# Patient Record
Sex: Male | Born: 1975
Health system: Southern US, Community
[De-identification: ages and names within clinical notes are randomized; demographics above are authoritative.]

## PROBLEM LIST (undated history)

## (undated) DIAGNOSIS — R945 Abnormal results of liver function studies: Secondary | ICD-10-CM

## (undated) DIAGNOSIS — E785 Hyperlipidemia, unspecified: Secondary | ICD-10-CM

## (undated) DIAGNOSIS — I10 Essential (primary) hypertension: Secondary | ICD-10-CM

## (undated) DIAGNOSIS — E119 Type 2 diabetes mellitus without complications: Secondary | ICD-10-CM

## (undated) DIAGNOSIS — E669 Obesity, unspecified: Secondary | ICD-10-CM

## (undated) DIAGNOSIS — B353 Tinea pedis: Secondary | ICD-10-CM

## (undated) HISTORY — DX: Hyperlipidemia, unspecified: E78.5

## (undated) HISTORY — DX: Tinea pedis: B35.3

## (undated) HISTORY — DX: Essential (primary) hypertension: I10

## (undated) HISTORY — DX: Type 2 diabetes mellitus without complications: E11.9

## (undated) HISTORY — DX: Abnormal results of liver function studies: R94.5

## (undated) HISTORY — PX: NO PAST SURGERIES: SHX2092

---

## 1999-10-09 ENCOUNTER — Emergency Department (HOSPITAL_COMMUNITY): Admission: EM | Admit: 1999-10-09 | Discharge: 1999-10-09 | Payer: Self-pay

## 2004-05-23 ENCOUNTER — Emergency Department (HOSPITAL_COMMUNITY): Admission: EM | Admit: 2004-05-23 | Discharge: 2004-05-23 | Payer: Self-pay | Admitting: Emergency Medicine

## 2011-07-09 ENCOUNTER — Emergency Department (INDEPENDENT_AMBULATORY_CARE_PROVIDER_SITE_OTHER)
Admission: EM | Admit: 2011-07-09 | Discharge: 2011-07-09 | Disposition: A | Discharge status: Home / Self Care | Payer: Self-pay | Source: Home / Self Care

## 2011-07-09 DIAGNOSIS — L03119 Cellulitis of unspecified part of limb: Secondary | ICD-10-CM

## 2011-07-09 DIAGNOSIS — L02415 Cutaneous abscess of right lower limb: Secondary | ICD-10-CM

## 2011-07-09 MED ORDER — DOXYCYCLINE HYCLATE 100 MG PO CAPS: 100.0000 mg | ORAL_CAPSULE | Freq: Two times a day (BID) | ORAL | Status: AC

## 2011-07-09 NOTE — ED Provider Notes (Signed)
Medical screening examination/treatment/procedure(s) were performed by non-physician practitioner and as supervising physician I was immediately available for consultation/collaboration.   Guy Toney DOUGLAS MD.    Camden Mazzaferro Douglas Robbin Escher, MD 07/09/11 2045 

## 2011-07-09 NOTE — ED Notes (Signed)
Patient has abscess to lateral calf just distal to the knee;  Patient states it started 4-5 days ago.  Patient states he tried to drain abscess on his own without success.  Patient hit his leg at work today and abscess started to drain.

## 2011-07-09 NOTE — ED Provider Notes (Signed)
History     CSN: [id number] Arrival date & time: 07/09/2011  5:26 PM   None     Chief Complaint  Patient presents with  . Abscess    (Consider location/radiation/quality/duration/timing/severity/associated sxs/prior treatment) Patient is a 35 y.o. male presenting with abscess. The history is provided by the patient.  Abscess  This is a new problem. Episode onset: 4-5 days ago. The onset was sudden. The problem occurs continuously. The problem has been gradually worsening. The abscess is present on the right lower leg. The problem is moderate. The abscess is characterized by painfulness, draining and swelling. Pertinent negatives include no fever. His past medical history does not include skin abscesses in family. Past medical history comments: No personal hx of abscesses. . There were no sick contacts. He has received no recent medical care.  Pt states he began recently using Hydrogen Peroxide on area of infection and today noticed increased swelling and drainage.   History reviewed. No pertinent past medical history.  History reviewed. No pertinent past surgical history.  History reviewed. No pertinent family history.  History  Substance Use Topics  . Smoking status: Never Smoker   . Smokeless tobacco: Not on file  . Alcohol Use: Yes     socially      Review of Systems  Constitutional: Negative for fever and chills.  Respiratory: Negative for shortness of breath.   Cardiovascular: Negative for chest pain.  Musculoskeletal: Negative for myalgias and joint swelling.    Allergies  Review of patient's allergies indicates no known allergies.  Home Medications   Current Outpatient Rx  Name Route Sig Dispense Refill  . DOXYCYCLINE HYCLATE 100 MG PO CAPS Oral Take 1 capsule (100 mg total) by mouth 2 (two) times daily. 20 capsule 0    BP 169/88  Pulse 100  Temp(Src) 99.8 F (37.7 C) (Oral)  Resp 20  SpO2 100%  Physical Exam  Nursing note and vitals  reviewed. Constitutional: He appears well-developed and well-nourished. No distress.  Musculoskeletal: Normal range of motion.       Right lower leg: He exhibits tenderness and swelling. He exhibits no bony tenderness, no edema and no laceration.       Legs: Skin: Skin is warm and dry. Lesion (see musculoskeletal exam) noted. No rash noted.  Psychiatric: He has a normal mood and affect.    ED Course  Procedures (including critical care time)   Labs Reviewed  CULTURE, ROUTINE-ABSCESS   No results found.   1. Abscess of right lower leg       MDM  Abscess RLL is draining. Small amount of pus expressed then only blood expressed. I&D not perfomed.         Melody Comas, [place] 07/09/11 1820

## 2011-07-12 LAB — CULTURE, ROUTINE-ABSCESS

## 2011-07-13 NOTE — ED Notes (Signed)
Abscess culture showed abundant staph aureus.  Pt adequately treated with Doxycycline.

## 2012-12-12 ENCOUNTER — Encounter: Payer: Self-pay | Admitting: Internal Medicine

## 2012-12-12 ENCOUNTER — Ambulatory Visit (INDEPENDENT_AMBULATORY_CARE_PROVIDER_SITE_OTHER): Payer: BC Managed Care – PPO | Admitting: Internal Medicine

## 2012-12-12 VITALS — BP 142/88 | HR 82 | Temp 97.9°F | Ht 71.0 in | Wt 342.0 lb

## 2012-12-12 DIAGNOSIS — Z0000 Encounter for general adult medical examination without abnormal findings: Secondary | ICD-10-CM

## 2012-12-12 DIAGNOSIS — Z23 Encounter for immunization: Secondary | ICD-10-CM

## 2012-12-12 DIAGNOSIS — I10 Essential (primary) hypertension: Secondary | ICD-10-CM | POA: Insufficient documentation

## 2012-12-12 DIAGNOSIS — R0683 Snoring: Secondary | ICD-10-CM

## 2012-12-12 DIAGNOSIS — G4733 Obstructive sleep apnea (adult) (pediatric): Secondary | ICD-10-CM | POA: Insufficient documentation

## 2012-12-12 HISTORY — DX: Essential (primary) hypertension: I10

## 2012-12-12 NOTE — Patient Instructions (Addendum)
Please come back fasting: FLP, CMP, CBC, TSH --- dx annual exam ---- Start a gradual exercise program, goal is to exercise at least 3 hours a week. We talked  about diet today, if you need further information or a nutritionist referral let me know. --- Check the  blood pressure 2 or 3 times a week, be sure it is between 110/60 and 140/85. If it is consistently higher or lower, let me know --- Please come back in 6 months, I am interested in checking your blood pressure and  Weight.      Sodium-Controlled Diet Sodium is a mineral. It is found in many foods. Sodium may be found naturally or added during the making of a food. The most common form of sodium is salt, which is made up of sodium and chloride. Reducing your sodium intake involves changing your eating habits. The following guidelines will help you reduce the sodium in your diet:  Stop using the salt shaker.  Use salt sparingly in cooking and baking.  Substitute with sodium-free seasonings and spices.  Do not use a salt substitute (potassium chloride) without your caregiver's permission.  Include a variety of fresh, unprocessed foods in your diet.  Limit the use of processed and convenience foods that are high in sodium. USE THE FOLLOWING FOODS SPARINGLY: Breads/Starches  Commercial bread stuffing, commercial pancake or waffle mixes, coating mixes. Waffles. Croutons. Prepared (boxed or frozen) potato, rice, or noodle mixes that contain salt or sodium. Salted [place] fries or hash browns. Salted popcorn, breads, crackers, chips, or snack foods. Vegetables  Vegetables canned with salt or prepared in cream, butter, or cheese sauces. Sauerkraut. Tomato or vegetable juices canned with salt.  Fresh vegetables are allowed if rinsed thoroughly. Fruit  Fruit is okay to eat. Meat and Meat Substitutes  Salted or smoked meats, such as bacon or Canadian bacon, chipped or corned beef, hot dogs, salt pork, luncheon meats, pastrami,  ham, or sausage. Canned or smoked fish, poultry, or meat. Processed cheese or cheese spreads, blue or Roquefort cheese. Battered or frozen fish products. Prepared spaghetti sauce. Baked beans. Reuben sandwiches. Salted nuts. Caviar. Milk  Limit buttermilk to 1 cup per week. Soups and Combination Foods  Bouillon cubes, canned or dried soups, broth, consomm. Convenience (frozen or packaged) dinners with more than 600 mg sodium. Pot pies, pizza, Asian food, fast food cheeseburgers, and specialty sandwiches. Desserts and Sweets  Regular (salted) desserts, pie, commercial fruit snack pies, commercial snack cakes, canned puddings.  Eat desserts and sweets in moderation. Fats and Oils  Gravy mixes or canned gravy. No more than 1 to 2 tbs of salad dressing. Chip dips.  Eat fats and oils in moderation. Beverages  See those listed under the vegetables and milk groups. Condiments  Ketchup, mustard, meat sauces, salsa, regular (salted) and lite soy sauce or mustard. Dill pickles, olives, meat tenderizer. Prepared horseradish or pickle relish. Dutch-processed cocoa. Baking powder or baking soda used medicinally. Worcestershire sauce. "Light" salt. Salt substitute, unless approved by your caregiver. Document Released: 01/26/2002 Document Revised: 10/29/2011 Document Reviewed: 08/29/2009 [facility] Patient Information 2013 [place], [place].

## 2012-12-12 NOTE — Assessment & Plan Note (Signed)
Snores heavily, episodes of apnea, BMI quite elevated: Refer to pulmonary to rule out sleep apnea. Weight loss is paramount, patient aware.

## 2012-12-12 NOTE — Assessment & Plan Note (Signed)
BP today 142/88, BP In 2012 when he was at the urgent care was 169/88. I believe he has a mildly elevated BP, should get better with exercise, weight loss, low salt diet. Patient aware. See instructions, reassess in 6 months

## 2012-12-12 NOTE — Progress Notes (Signed)
  Subjective:    Patient ID: Michael Willis, male    DOB: [birth year], 37 y.o.   MRN: [id number]  HPI New patient, here with his girlfriend.Requests a CPX.  No past medical history on file.  Past Surgical History  Procedure Laterality Date  . No past surgeries     History   Social History  . Marital Status: Single    Spouse Name: N/A    Number of Children: 5  . 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 Smoking status: Never Smoker   . Smokeless tobacco: Never Used  . Alcohol Use: Yes     Comment: socially  . Drug Use: No  . Sexually Active: Not on file   Other Topics Concern  . Not on file   Social History Narrative   Has a GF, live together .   Exercise: none   Diet: regular   Family History  Problem Relation Age of Onset  . Diabetes Neg Hx   . CAD Mother 43    MI?  . Stroke Neg Hx   . Colon cancer Neg Hx   . Prostate cancer Neg Hx      Review of Systems  Respiratory: Negative for cough, shortness of breath and wheezing.   Cardiovascular: Negative for chest pain, palpitations and leg swelling.  Gastrointestinal: Negative for abdominal pain and blood in stool.  Genitourinary: Negative for dysuria and hematuria.  Psychiatric/Behavioral:       No depression or anxiety   The patient's girlfriend reports that he snores quite heavily and has  prolonged episodes of apnea at night. The patient notes  that has been the case for long time. Denies feeling un rested in the morning, not sleepy throughout the day. When asked admits that he is very thirsty but has been that way all his life.     Objective:   Physical Exam BP 142/88  Pulse 82  Temp(Src) 97.9 F (36.6 C) (Oral)  Ht 5\' 11"  (1.803 m)  Wt 342 lb (155.13 kg)  BMI 47.72 kg/m2  SpO2 91%  General -- alert, well-developed, Morbidly obese, no distress .   Neck --no thyromegaly  HEENT -- TMs normal, throat w/o redness,  face symmetric  Lungs -- normal respiratory effort, no intercostal retractions, no accessory muscle use, and normal breath sounds.   Heart-- normal rate, regular rhythm, no murmur, and no gallop.   Abdomen--soft, non-tender, no distention, no masses, no HSM, no guarding, and no rigidity.   Extremities-- trace pretibial edema bilaterally Neurologic-- alert & oriented X3 and strength normal in all extremities. Psych-- Cognition and judgment appear intact. Alert and cooperative with normal attention span and concentration.  not anxious appearing and not depressed appearing.       Assessment & Plan:

## 2012-12-12 NOTE — Assessment & Plan Note (Addendum)
New patient, here for a physical exam. Tdap today EKG for baseline --> nsr O2 sats initially 91, repeated was 98. Never had a colonoscopy His main problem is his weight, BMI is 47. He reports he has been overweight all his life, he weighed 225 pounds in high school. Consequences of obesity discussed including potential for diabetes, hypertension, early death, DJD, sleep apnea, et Karie Soda discussed  Recommend a gradual exercise program. Weight Watchers? [employer]? Nutritionist referral?. Recommend to reassess in 6 months. Labs

## 2012-12-19 ENCOUNTER — Other Ambulatory Visit (INDEPENDENT_AMBULATORY_CARE_PROVIDER_SITE_OTHER): Payer: BC Managed Care – PPO

## 2012-12-19 DIAGNOSIS — Z0000 Encounter for general adult medical examination without abnormal findings: Secondary | ICD-10-CM

## 2012-12-19 LAB — CBC WITH DIFFERENTIAL/PLATELET
Basophils Absolute: 0 10*3/uL (ref 0.0–0.1)
Basophils Relative: 0.4 % (ref 0.0–3.0)
Eosinophils Absolute: 0.2 10*3/uL (ref 0.0–0.7)
Lymphocytes Relative: 32.7 % (ref 12.0–46.0)
MCHC: 34.8 g/dL (ref 30.0–36.0)
MCV: 83.8 fl (ref 78.0–100.0)
Monocytes Absolute: 0.8 10*3/uL (ref 0.1–1.0)
Neutrophils Relative %: 55.7 % (ref 43.0–77.0)
Platelets: 191 10*3/uL (ref 150.0–400.0)
RBC: 5.01 Mil/uL (ref 4.22–5.81)
RDW: 13.1 % (ref 11.5–14.6)

## 2012-12-19 LAB — TSH: TSH: 1.34 u[IU]/mL (ref 0.35–5.50)

## 2012-12-21 ENCOUNTER — Emergency Department (HOSPITAL_COMMUNITY)
Admission: EM | Admit: 2012-12-21 | Discharge: 2012-12-21 | Disposition: A | Discharge status: Home / Self Care | Payer: BC Managed Care – PPO | Attending: Emergency Medicine | Admitting: Emergency Medicine

## 2012-12-21 ENCOUNTER — Encounter (HOSPITAL_COMMUNITY): Payer: Self-pay | Admitting: Emergency Medicine

## 2012-12-21 DIAGNOSIS — Y93H9 Activity, other involving exterior property and land maintenance, building and construction: Secondary | ICD-10-CM | POA: Insufficient documentation

## 2012-12-21 DIAGNOSIS — S91109A Unspecified open wound of unspecified toe(s) without damage to nail, initial encounter: Secondary | ICD-10-CM | POA: Insufficient documentation

## 2012-12-21 DIAGNOSIS — Y92009 Unspecified place in unspecified non-institutional (private) residence as the place of occurrence of the external cause: Secondary | ICD-10-CM | POA: Insufficient documentation

## 2012-12-21 DIAGNOSIS — E669 Obesity, unspecified: Secondary | ICD-10-CM | POA: Insufficient documentation

## 2012-12-21 DIAGNOSIS — S91115A Laceration without foreign body of left lesser toe(s) without damage to nail, initial encounter: Secondary | ICD-10-CM

## 2012-12-21 DIAGNOSIS — W269XXA Contact with unspecified sharp object(s), initial encounter: Secondary | ICD-10-CM | POA: Insufficient documentation

## 2012-12-21 HISTORY — DX: Obesity, unspecified: E66.9

## 2012-12-21 MED ORDER — HYDROCODONE-ACETAMINOPHEN 5-325 MG PO TABS: 2.0000 | ORAL_TABLET | ORAL | Status: DC | PRN

## 2012-12-21 NOTE — ED Notes (Signed)
Pt states he noticed laceration around 3pm this afternoon.

## 2012-12-21 NOTE — ED Provider Notes (Signed)
Medical screening examination/treatment/procedure(s) were performed by non-physician practitioner and as supervising physician I was immediately available for consultation/collaboration.   Bernyce Brimley, MD 12/21/12 2312 

## 2012-12-21 NOTE — ED Notes (Signed)
PT. TRIPPED AND FELL AT HOME  TODAY PRESENTS  WITH RIGHT PROXIMAL 5TH TOE LACERATION WITH SLIGHT BLEEDING .

## 2012-12-21 NOTE — ED Provider Notes (Signed)
History     CSN: [id number]  Arrival date & time 12/21/12  2002   None     Chief Complaint  Patient presents with  . Toe Injury    (Consider location/radiation/quality/duration/timing/severity/associated sxs/prior treatment) HPI History provided by pt.   Pt sustained a laceration to plantar surface of right pinky toe while working outside today.  He is unsure of cause of injury. Pain is minimal and bleeding controlled.  No associated paresthesias. Tetanus is up to date.   Past Medical History  Diagnosis Date  . Obesity     Past Surgical History  Procedure Laterality Date  . No past surgeries      Family History  Problem Relation Age of Onset  . Diabetes Neg Hx   . CAD Mother 78    MI?  . Stroke Neg Hx   . Colon cancer Neg Hx   . Prostate cancer Neg Hx     History  Substance Use Topics  . Smoking status: Never Smoker   . Smokeless tobacco: Never Used  . Alcohol Use: Yes     Comment: socially      Review of Systems  All other systems reviewed and are negative.    Allergies  Review of patient's allergies indicates no known allergies.  Home Medications   Current Outpatient Rx  Name  Route  Sig  Dispense  Refill  . ibuprofen (ADVIL,MOTRIN) 200 MG tablet   Oral   Take 200 mg by mouth daily as needed for pain. For pain         . HYDROcodone-acetaminophen (NORCO/VICODIN) 5-325 MG per tablet   Oral   Take 2 tablets by mouth every 4 (four) hours as needed for pain.   10 tablet   0     BP 154/96  Pulse 88  Temp(Src) 98.3 F (36.8 C) (Oral)  Resp 16  SpO2 98%  Physical Exam  Nursing note and vitals reviewed. Constitutional: He is oriented to person, place, and time. He appears well-developed and well-nourished. No distress.  HENT:  Head: Normocephalic and atraumatic.  Eyes:  Normal appearance  Neck: Normal range of motion.  Pulmonary/Chest: Effort normal.  Musculoskeletal: Normal range of motion.  Varus deformity of right fifth toe; it is  naturally tucked below the fourth toe.  There is a 1.5cm horizontal laceration that runs along plantar surface of metatarsal-phalangeal joint.  Hemostatic and clean.  Distal sensation intact.   Neurological: He is alert and oriented to person, place, and time.  Psychiatric: He has a normal mood and affect. His behavior is normal.    ED Course  Procedures (including critical care time)  LACERATION REPAIR Performed by: Otilio Miu Authorized by: Ruby Cola E Consent: Verbal consent obtained. Risks and benefits: risks, benefits and alternatives were discussed Consent given by: patient Patient identity confirmed: provided demographic data Prepped and Draped in normal sterile fashion Wound explored  Laceration Location: plantar surface of right fifth toe Laceration Length: 1.5cm  No Foreign Bodies seen or palpated  Anesthesia: local infiltration  Local anesthetic: lidocaine 2% w/out epinephrine  Anesthetic total: 3 ml  Irrigation method: syringe Amount of cleaning: standard  Skin closure: prolene 3.0  Number of sutures: 4  Technique: simple interrupted  Patient tolerance: Patient tolerated the procedure well with no immediate complications.  Labs Reviewed - No data to display No results found.   1. Laceration of fifth toe, left, initial encounter       MDM  37yo healthy M presents  w/ laceration to plantar surface of right fifth metatarsal-phalangeal joint.  Toe w/ significant varus deformity and I suspect that the skin tore when he stepped on something, causing the toe to extend at this joint.  Wound cleaned by nursing staff and then sutured.  Tetanus is up to date.  Prescribed 10 vicodin for pain and nursing staff wrapped to immobilize and provided w/ post-op shoe.  Return precautions discussed.          Otilio Miu, PA-C 12/21/12 [id number]

## 2012-12-21 NOTE — ED Notes (Signed)
Suture cart at bedside 

## 2012-12-22 LAB — COMPREHENSIVE METABOLIC PANEL
AST: 44 U/L — ABNORMAL HIGH (ref 0–37)
Albumin: 4.3 g/dL (ref 3.5–5.2)
BUN: 13 mg/dL (ref 6–23)
Calcium: 9.2 mg/dL (ref 8.4–10.5)
Chloride: 100 mEq/L (ref 96–112)
Glucose, Bld: 193 mg/dL — ABNORMAL HIGH (ref 70–99)
Potassium: 3.7 mEq/L (ref 3.5–5.1)
Sodium: 135 mEq/L (ref 135–145)
Total Protein: 7.6 g/dL (ref 6.0–8.3)

## 2012-12-22 LAB — LIPID PANEL: VLDL: 100.6 mg/dL — ABNORMAL HIGH (ref 0.0–40.0)

## 2012-12-22 LAB — LDL CHOLESTEROL, DIRECT: Direct LDL: 143.1 mg/dL

## 2012-12-23 ENCOUNTER — Telehealth: Payer: Self-pay | Admitting: Internal Medicine

## 2012-12-23 NOTE — Telephone Encounter (Signed)
Patient Information:  Caller Name: Markeem  Phone: [phone]  Patient: Michael Willis, Michael Willis  Gender: Male  DOB: Jul 28, 1976  Age: 37 Years  PCP: Willow Ora  Office Follow Up:  Does the office need to follow up with this patient?: Yes  Instructions For The Office: see RN notes  RN Note:  Patient is not having any pain other than normal soreness at the location of the injury. He has not used any of the pain medication that was prescribed. He wants to know if the stitches can come out this early when he was told they needed to stay in for 10 days. Patient able to walk normally.  Symptoms  Reason For Call & Symptoms: Patient fell at home on Sunday and went to the ED where he got 4 stitches in his foot. Two of the stitches are now loose. Patient was told he needed to keep the stitches in for 10 days. No drainage or bleeding from the injury. No drainage or redness around the stitches.  Reviewed Health History In EMR: Yes  Reviewed Medications In EMR: Yes  Reviewed Allergies In EMR: Yes  Reviewed Surgeries / Procedures: Yes  Date of Onset of Symptoms: 12/23/2012  Guideline(s) Used:  No Protocol Available - Information Only  Disposition Per Guideline:   Callback by PCP Today  Reason For Disposition Reached:   Nursing judgment  Advice Given:  N/A  Patient Will Follow Care Advice:  YES

## 2012-12-25 NOTE — Telephone Encounter (Signed)
Yes f/u in 10 days, sooner if redness, d/c or increase pain

## 2012-12-25 NOTE — Telephone Encounter (Signed)
Does pt just need to just schedule appt next week to remove stitches? Please advise.

## 2012-12-25 NOTE — Telephone Encounter (Signed)
Caller: Reynaldo/Patient; Phone: [phone]; Reason for Call: Patient called on 12/23/12.  Message was sent regarding stitches in his foot coming out before ten days.  Patient has not received a call back with any instructions.  Please contact patient.

## 2012-12-25 NOTE — Telephone Encounter (Signed)
Spoke to pt, pt states his stitches came out its on & would like to follow-up with Dr. Drue Novel to make sure everything is ok. Scheduled pt for 5.9.14 @245p .

## 2012-12-26 ENCOUNTER — Ambulatory Visit (INDEPENDENT_AMBULATORY_CARE_PROVIDER_SITE_OTHER): Payer: BC Managed Care – PPO | Admitting: Internal Medicine

## 2012-12-26 VITALS — BP 140/90 | HR 104 | Temp 98.0°F | Wt 338.0 lb

## 2012-12-26 DIAGNOSIS — R739 Hyperglycemia, unspecified: Secondary | ICD-10-CM

## 2012-12-26 DIAGNOSIS — IMO0002 Reserved for concepts with insufficient information to code with codable children: Secondary | ICD-10-CM

## 2012-12-26 DIAGNOSIS — S91119S Laceration without foreign body of unspecified toe without damage to nail, sequela: Secondary | ICD-10-CM

## 2012-12-26 DIAGNOSIS — R7309 Other abnormal glucose: Secondary | ICD-10-CM

## 2012-12-26 MED ORDER — DOXYCYCLINE HYCLATE 100 MG PO TABS: 100.0000 mg | ORAL_TABLET | Freq: Two times a day (BID) | ORAL | Status: DC

## 2012-12-26 MED ORDER — KETOCONAZOLE 2 % EX CREA: TOPICAL_CREAM | Freq: Every day | CUTANEOUS | Status: DC

## 2012-12-26 NOTE — Patient Instructions (Addendum)
Wound care: Keep the area clean and dry, take your showers as usual Call anytime if you see redness, swelling or discharge. Take the  antibiotics as prescribed (Doxycycline) --- Michael Willis checking a test for diabetes, will call you with a result in few days. --- You do have a skin infection in the feet, put ketoconazole every night on all the feet and between the toes except for the area of the wound. --- Next visit in 2 months

## 2012-12-26 NOTE — Progress Notes (Signed)
  Subjective:    Patient ID: Michael Willis, male    DOB: August 18, 1976, 37 y.o.   MRN: [id number]  HPI ER f/u, went to the ER 12-21-12, had a laceration, unclear how it happened. Here b/c stitches "came out".  Past Medical History  Diagnosis Date  . Obesity    Past Surgical History  Procedure Laterality Date  . No past surgeries      Review of Systems Denies pain, fever , d/c    Objective:   Physical Exam General -- alert, well-developed, NAD.    Extremities-- no pretibial edema bilaterally base of the 5th R toe has a deep laceration, stitches are not holding anything. No d/c or cellulitis. Otherwise, the foot has changes c/w tinea pedis (mocasine distribution) and onychomycosis  . Psych-- Cognition and judgment appear intact. Alert and cooperative with normal attention span and concentration.  not anxious appearing and not depressed appearing.        Assessment & Plan:   Laceration Stitches are not holding anything, we'll remove them, see instructions about wound care  Hyperglycemia, check a hemoglobin A1c, I think he likely has diabetes, I told the patient will communicate with him next week about results. Addendum, pt was somehow surprised about possible "elevated sugar", explained in simple terms what that means, the need for diet, exercise, loose wt and probably meds..  Tinea pedis and onychomycosis Rx topical ketoconazole but If not improving will need oral medications (Notice his LFTs are elevated).   increased LFTs, elevated cholesterol noted   Today , I spent more than 25 min with the patient, >50% of the time counseling, and   reviewing the chart

## 2012-12-27 LAB — HEMOGLOBIN A1C: Hgb A1c MFr Bld: 8.4 % — ABNORMAL HIGH (ref <5.7)

## 2012-12-28 ENCOUNTER — Encounter: Payer: Self-pay | Admitting: Internal Medicine

## 2013-01-05 ENCOUNTER — Ambulatory Visit (INDEPENDENT_AMBULATORY_CARE_PROVIDER_SITE_OTHER): Payer: BC Managed Care – PPO | Admitting: Internal Medicine

## 2013-01-05 ENCOUNTER — Encounter: Payer: Self-pay | Admitting: Internal Medicine

## 2013-01-05 VITALS — BP 136/76 | HR 94 | Temp 98.4°F | Wt 330.0 lb

## 2013-01-05 DIAGNOSIS — E119 Type 2 diabetes mellitus without complications: Secondary | ICD-10-CM

## 2013-01-05 DIAGNOSIS — B353 Tinea pedis: Secondary | ICD-10-CM

## 2013-01-05 DIAGNOSIS — R7989 Other specified abnormal findings of blood chemistry: Secondary | ICD-10-CM

## 2013-01-05 HISTORY — DX: Other specified abnormal findings of blood chemistry: R79.89

## 2013-01-05 HISTORY — DX: Tinea pedis: B35.3

## 2013-01-05 HISTORY — DX: Type 2 diabetes mellitus without complications: E11.9

## 2013-01-05 NOTE — Assessment & Plan Note (Signed)
Tinea pedis improving, I think at some point he will need oral therapy but for now we'll focus on diabetes care, LFTs are slightly elevated.

## 2013-01-05 NOTE — Assessment & Plan Note (Addendum)
Recently diagnosed with diabetes with A1c of 8.4. Patient  Educated about: What is diabetes, risks and consequences, also diet and exercise. Recommend to read the book Diabetes for Dummies, he already did some self learning Start janumet, 4 weeks of samples provided.LFTs in 3 weeks, prescription will be  Issued if LFTs okay.

## 2013-01-05 NOTE — Progress Notes (Signed)
  Subjective:    Patient ID: Michael Willis, male    DOB: 04/05/1976, 37 y.o.   MRN: [id number]  HPI Followup from previous visit. Status post antibiotics, foot wound looks better to patient. As far as diabetes, he has done some reading, has already started to change his diet. Increased LFTs, he drinks alcohol very rarely, does not take Tylenol  Past Medical History  Diagnosis Date  . Obesity   . Diabetes 01/05/2013  . Tinea pedis, onychomycosis 01/05/2013  . Elevated LFTs 01/05/2013   Past Surgical History  Procedure Laterality Date  . No past surgeries      Review of Systems     Objective:   Physical Exam Alert, oriented x3, no apparent distress. Vital signs stable. Laceration by the fifth right toe seems better, not redness, discharge. Tinea pedis decreased-better  to inspection as well.       Assessment & Plan:  Laceration, Seems to be improving, patient is going to the beach this weekend, encouraged to keep a close eye on the area, if redness or swelling needs to see a local doctor.

## 2013-01-05 NOTE — Patient Instructions (Addendum)
janumet 50-500, 1 tablet a day for 3 days, then one tablet twice a day. Check your blood sugar once a day Come back to this office in  3 weeks to recheck your blood only (LFTs dx DM), make an appointment Next visit to see me 10 week Watch the wound in the foot, you need to be seen if redness , swelling or discharge

## 2013-01-05 NOTE — Assessment & Plan Note (Signed)
Mildly elevated today, we'll recheck in 3 weeks. Starting Janumet today

## 2013-01-16 ENCOUNTER — Ambulatory Visit (INDEPENDENT_AMBULATORY_CARE_PROVIDER_SITE_OTHER): Payer: BC Managed Care – PPO | Admitting: Pulmonary Disease

## 2013-01-16 ENCOUNTER — Encounter: Payer: Self-pay | Admitting: Pulmonary Disease

## 2013-01-16 VITALS — BP 160/100 | HR 86 | Temp 98.1°F | Ht 72.0 in | Wt 327.8 lb

## 2013-01-16 DIAGNOSIS — G4733 Obstructive sleep apnea (adult) (pediatric): Secondary | ICD-10-CM

## 2013-01-16 NOTE — Assessment & Plan Note (Signed)
The patient's history is most consistent with significant sleep disordered breathing, and he also has underlying diabetes and elevated blood pressure today as well.  I had a long discussion with him about sleep apnea, including its impact to his quality of life and cardiovascular health.  I think he needs to have a sleep study for diagnosis, and he is an excellent candidate for home sleep testing.

## 2013-01-16 NOTE — Patient Instructions (Addendum)
Will set up for home sleep testing.  Once results are available, will arrange followup to discuss. Work on weight loss

## 2013-01-16 NOTE — Progress Notes (Signed)
Subjective:    Patient ID: Michael Willis, male    DOB: [birth year], 37 y.o.   MRN: [id number]  HPI The patient is a 37 year old male who been asked to see for possible obstructive sleep apnea.  He has been noted by his bed partner to have very loud snoring, as well as an abnormal breathing pattern during sleep.  He has frequently awakenings at night, and is only rested about half the mornings when he arises.  He denies any significant sleepiness during the day, but will fall asleep in the evenings watching television.  He also has some sleep pressure driving longer distances.  The patient states that his weight is up about 10-20 pounds over the last 2 years, and his Epworth score today is 4   Sleep Questionnaire What time do you typically go to bed?( Between what hours) 11-11:30Pm 11-11:30Pm at 1004 on 01/16/13 by Alecia Lemming, CMA How long does it take you to fall asleep?  at 1004 on 01/16/13 by Alecia Lemming, CMA How many times during the night do you wake up? No Value 2-3  at 1004 on 01/16/13 by Alecia Lemming, CMA What time do you get out of bed to start your day? 0700 0700 at 1004 on 01/16/13 by Alecia Lemming, CMA Do you drive or operate heavy machinery in your occupation? No No at 1004 on 01/16/13 by Alecia Lemming, CMA How much has your weight changed (up or down) over the past two years? (In pounds) No Value 10-20 at 1004 on 01/16/13 by Alecia Lemming, CMA Have you ever had a sleep study before? No No at 1004 on 01/16/13 by Alecia Lemming, CMA Do you currently use CPAP? No No at 1004 on 01/16/13 by Alecia Lemming, CMA Do you wear oxygen at any time? No    Review of Systems  Constitutional: Negative for fever and unexpected weight change.  HENT: Positive for congestion and rhinorrhea. Negative for ear pain, nosebleeds, sore throat, sneezing, trouble swallowing, dental problem, postnasal drip and sinus pressure.        Only at  night   Eyes: Negative for redness and itching.  Respiratory: Negative for cough, chest tightness, shortness of breath and wheezing.   Cardiovascular: Negative for palpitations and leg swelling.  Gastrointestinal: Negative for nausea and vomiting.  Genitourinary: Negative for dysuria.  Musculoskeletal: Negative for joint swelling.  Skin: Negative for rash.  Neurological: Negative for headaches.  Hematological: Does not bruise/bleed easily.  Psychiatric/Behavioral: Negative for dysphoric mood. The patient is not nervous/anxious.        Objective:   Physical Exam Constitutional:  Morbidly obese male, no acute distress  HENT:  Nares patent without discharge, large turbinates, deviated septum to left with narrowing.   Oropharynx without exudate, palate and uvula are elongated, large tongue, mod increased tonsils.   Eyes:  Perrla, eomi, no scleral icterus  Neck:  No JVD, no TMG  Cardiovascular:  Normal rate, regular rhythm, no rubs or gallops.  No murmurs        Intact distal pulses  Pulmonary :  Normal breath sounds, no stridor or respiratory distress   No rales, rhonchi, or wheezing  Abdominal:  Soft, nondistended, bowel sounds present.  No tenderness noted.   Musculoskeletal:  No lower extremity edema noted.  Lymph Nodes:  No cervical lymphadenopathy noted  Skin:  No cyanosis noted  Neurologic:  Appears mildly sleepy, appropriate, moves all 4 extremities without obvious deficit.  Assessment & Plan:

## 2013-01-26 ENCOUNTER — Other Ambulatory Visit: Payer: BC Managed Care – PPO

## 2013-01-27 ENCOUNTER — Telehealth: Payer: Self-pay | Admitting: Internal Medicine

## 2013-01-27 ENCOUNTER — Other Ambulatory Visit (INDEPENDENT_AMBULATORY_CARE_PROVIDER_SITE_OTHER): Payer: BC Managed Care – PPO

## 2013-01-27 DIAGNOSIS — R7309 Other abnormal glucose: Secondary | ICD-10-CM

## 2013-01-27 DIAGNOSIS — R739 Hyperglycemia, unspecified: Secondary | ICD-10-CM

## 2013-01-27 LAB — HEPATIC FUNCTION PANEL
ALT: 72 U/L — ABNORMAL HIGH (ref 0–53)
Albumin: 3.9 g/dL (ref 3.5–5.2)
Total Protein: 7.5 g/dL (ref 6.0–8.3)

## 2013-01-27 LAB — HEMOGLOBIN A1C: Hgb A1c MFr Bld: 9.5 % — ABNORMAL HIGH (ref 4.6–6.5)

## 2013-01-27 MED ORDER — KETOCONAZOLE 2 % EX CREA: TOPICAL_CREAM | Freq: Every day | CUTANEOUS | Status: DC

## 2013-01-27 NOTE — Addendum Note (Signed)
Addended by: Silvio Pate D on: 01/27/2013 08:39 AM   Modules accepted: Orders

## 2013-01-27 NOTE — Telephone Encounter (Signed)
Refill done.  

## 2013-01-27 NOTE — Telephone Encounter (Signed)
Pt wanted to see if he could get a refill for ketoconazole (NIZORAL) 2 % cream and for it to called into [id number] - [place] ([place]), [place] - [address] thanks

## 2013-02-09 ENCOUNTER — Ambulatory Visit: Payer: BC Managed Care – PPO | Admitting: Internal Medicine

## 2013-02-17 ENCOUNTER — Telehealth: Payer: Self-pay | Admitting: Internal Medicine

## 2013-02-17 MED ORDER — SITAGLIPTIN PHOS-METFORMIN HCL 50-500 MG PO TABS: 1.0000 | ORAL_TABLET | Freq: Two times a day (BID) | ORAL | Status: DC

## 2013-02-17 NOTE — Telephone Encounter (Signed)
Patient is asking for his prescription sitaGLIPtan-metformin (JANUMET) 50-500 MG per tablet be sent to [facility] on [address].

## 2013-02-17 NOTE — Telephone Encounter (Signed)
Rx sent 

## 2013-03-16 ENCOUNTER — Ambulatory Visit: Payer: BC Managed Care – PPO | Admitting: Internal Medicine

## 2013-03-20 ENCOUNTER — Telehealth: Payer: Self-pay | Admitting: Internal Medicine

## 2013-03-20 MED ORDER — SAXAGLIPTIN-METFORMIN ER 5-1000 MG PO TB24: 1.0000 | ORAL_TABLET | Freq: Every day | ORAL | Status: DC

## 2013-03-20 NOTE — Telephone Encounter (Signed)
Patient called stating Janumet is too expensive and he would like something else. Pt uses [facility] on [address].

## 2013-03-20 NOTE — Telephone Encounter (Signed)
Discussed with patient verbalized understanding. rx sent to pharmacy of choice.

## 2013-03-20 NOTE — Telephone Encounter (Signed)
Please advise 

## 2013-03-20 NOTE — Telephone Encounter (Signed)
We can try kombiglize, may be better covered by his insurance Rx is ready to be send, tell pt is a ONCE a day pill F/u as planned

## 2013-03-31 ENCOUNTER — Ambulatory Visit (INDEPENDENT_AMBULATORY_CARE_PROVIDER_SITE_OTHER): Payer: BC Managed Care – PPO | Admitting: Internal Medicine

## 2013-03-31 ENCOUNTER — Encounter: Payer: Self-pay | Admitting: Internal Medicine

## 2013-03-31 VITALS — BP 140/100 | HR 105 | Temp 98.0°F | Wt 291.4 lb

## 2013-03-31 DIAGNOSIS — B353 Tinea pedis: Secondary | ICD-10-CM

## 2013-03-31 DIAGNOSIS — R030 Elevated blood-pressure reading, without diagnosis of hypertension: Secondary | ICD-10-CM

## 2013-03-31 DIAGNOSIS — E119 Type 2 diabetes mellitus without complications: Secondary | ICD-10-CM

## 2013-03-31 MED ORDER — SITAGLIPTIN PHOS-METFORMIN HCL 50-500 MG PO TABS: 1.0000 | ORAL_TABLET | Freq: Two times a day (BID) | ORAL | Status: DC

## 2013-03-31 MED ORDER — LOSARTAN POTASSIUM 50 MG PO TABS: 50.0000 mg | ORAL_TABLET | Freq: Every day | ORAL | Status: DC

## 2013-03-31 NOTE — Patient Instructions (Addendum)
You're doing great Go back to janumet twice a day Start losartan 50 mg one every morning, this will help your blood pressure. Schedule labs to be done in  2 weeks from today:  BMP-- dx HTN A1C AST, ALT-- dx DM Next visit with me in 3 months

## 2013-03-31 NOTE — Progress Notes (Signed)
  Subjective:    Patient ID: Michael Willis, male    DOB: 08-Sep-1975, 37 y.o.   MRN: [id number]  HPI Three-month followup Since the last time he was here, he had to switch from janumet to [place] due to insurance issues. He doesn't like komobyglize Because immediately after he takes it he starts to urinate a lot. Likes to go back on janumet  Past Medical History  Diagnosis Date  . Obesity   . Diabetes 01/05/2013  . Tinea pedis, onychomycosis 01/05/2013  . Elevated LFTs 01/05/2013   Past Surgical History  Procedure Laterality Date  . No past surgeries       Review of Systems Diet--significantly improved, has lost weight Exercise--he remains active, also swimming. No ambulatory CBGs Denies dysuria or difficulty urinating Skin infection @ feet look better. Snoring less lately    Objective:   Physical Exam BP 140/100  Pulse 105  Temp(Src) 98 F (36.7 C) (Oral)  Wt 291 lb 6.4 oz (132.178 kg)  BMI 39.51 kg/m2  SpO2 94%  General -- alert, well-developed, NAD .   Lungs -- normal respiratory effort, no intercostal retractions, no accessory muscle use, and normal breath sounds.   Heart-- normal rate, regular rhythm, no murmur, and no gallop.   Extremities--  Skin between the toes normal, + onychomycosis  Psych-- Cognition and judgment appear intact. Alert and cooperative with normal attention span and concentration.  not anxious appearing and not depressed appearing.       Assessment & Plan:

## 2013-03-31 NOTE — Assessment & Plan Note (Addendum)
Janumet was very expensive, was forced to take kombyglize one at night with largest meal, c/o urinary frequency and likes to go back tojanumet. No amb CBGs Lifestyle has significantly improved. Plan: a1c, LFTs, back on janumet

## 2013-03-31 NOTE — Assessment & Plan Note (Signed)
Tinea pedis resilved, consider Rx onychomycosis later

## 2013-03-31 NOTE — Assessment & Plan Note (Signed)
BP still elevated despite loosing wt, start losartan

## 2013-04-14 ENCOUNTER — Other Ambulatory Visit: Payer: BC Managed Care – PPO

## 2013-04-17 ENCOUNTER — Other Ambulatory Visit: Payer: Self-pay | Admitting: *Deleted

## 2013-04-17 ENCOUNTER — Other Ambulatory Visit (INDEPENDENT_AMBULATORY_CARE_PROVIDER_SITE_OTHER): Payer: BC Managed Care – PPO

## 2013-04-17 DIAGNOSIS — I10 Essential (primary) hypertension: Secondary | ICD-10-CM

## 2013-04-17 DIAGNOSIS — E1110 Type 2 diabetes mellitus with ketoacidosis without coma: Secondary | ICD-10-CM

## 2013-04-17 DIAGNOSIS — E119 Type 2 diabetes mellitus without complications: Secondary | ICD-10-CM

## 2013-04-17 DIAGNOSIS — E1310 Other specified diabetes mellitus with ketoacidosis without coma: Secondary | ICD-10-CM

## 2013-04-17 LAB — HEMOGLOBIN A1C: Hgb A1c MFr Bld: 12.6 % — ABNORMAL HIGH (ref 4.6–6.5)

## 2013-04-17 LAB — BASIC METABOLIC PANEL
Calcium: 9.6 mg/dL (ref 8.4–10.5)
GFR: 112.35 mL/min (ref >60.00)
Potassium: 3.6 mEq/L (ref 3.5–5.1)
Sodium: 134 mEq/L — ABNORMAL LOW (ref 135–145)

## 2013-04-17 LAB — ALT: ALT: 42 U/L (ref 0–53)

## 2013-04-17 MED ORDER — GLIMEPIRIDE 2 MG PO TABS: 2.0000 mg | ORAL_TABLET | Freq: Every day | ORAL | Status: DC

## 2013-04-17 NOTE — Telephone Encounter (Signed)
Rx for amaryl sent to phramacy, pt made aware and appt made for next week with provider.

## 2013-04-24 ENCOUNTER — Ambulatory Visit: Payer: BC Managed Care – PPO | Admitting: Internal Medicine

## 2013-04-25 ENCOUNTER — Telehealth: Payer: Self-pay | Admitting: Internal Medicine

## 2013-04-25 NOTE — Telephone Encounter (Signed)
Diabetes poorly controlled, pt had an appointment to discuss results 04-24-13 but changed to 05-08-13

## 2013-04-28 ENCOUNTER — Telehealth: Payer: Self-pay | Admitting: *Deleted

## 2013-04-28 ENCOUNTER — Other Ambulatory Visit: Payer: Self-pay | Admitting: *Deleted

## 2013-04-28 NOTE — Telephone Encounter (Signed)
Message copied by Eustace Quail on Tue Apr 28, 2013  4:17 PM ------      Message from: Wanda Plump      Created: Sat Apr 25, 2013 10:03 PM      Regarding: did we ever called the pt?       If we did please document... ------

## 2013-04-28 NOTE — Telephone Encounter (Signed)
Pt notified via telephone. DJR  

## 2013-05-08 ENCOUNTER — Ambulatory Visit: Payer: BC Managed Care – PPO | Admitting: Internal Medicine

## 2013-05-21 ENCOUNTER — Ambulatory Visit: Payer: BC Managed Care – PPO | Admitting: Internal Medicine

## 2013-05-21 DIAGNOSIS — Z0289 Encounter for other administrative examinations: Secondary | ICD-10-CM

## 2013-05-30 ENCOUNTER — Other Ambulatory Visit: Payer: Self-pay | Admitting: Internal Medicine

## 2013-06-01 ENCOUNTER — Other Ambulatory Visit: Payer: Self-pay | Admitting: *Deleted

## 2013-06-01 MED ORDER — LOSARTAN POTASSIUM 50 MG PO TABS: 50.0000 mg | ORAL_TABLET | Freq: Every day | ORAL | Status: DC

## 2013-06-01 NOTE — Telephone Encounter (Signed)
Losartan refill sent to pharmacy 

## 2013-06-12 ENCOUNTER — Ambulatory Visit: Payer: BC Managed Care – PPO | Admitting: Internal Medicine

## 2013-06-30 ENCOUNTER — Encounter: Payer: Self-pay | Admitting: Internal Medicine

## 2013-06-30 ENCOUNTER — Ambulatory Visit (INDEPENDENT_AMBULATORY_CARE_PROVIDER_SITE_OTHER): Payer: BC Managed Care – PPO | Admitting: Internal Medicine

## 2013-06-30 VITALS — BP 154/100 | HR 78 | Temp 98.0°F | Wt 307.0 lb

## 2013-06-30 DIAGNOSIS — E119 Type 2 diabetes mellitus without complications: Secondary | ICD-10-CM

## 2013-06-30 DIAGNOSIS — R030 Elevated blood-pressure reading, without diagnosis of hypertension: Secondary | ICD-10-CM

## 2013-06-30 LAB — BASIC METABOLIC PANEL
BUN: 16 mg/dL (ref 6–23)
Calcium: 9.5 mg/dL (ref 8.4–10.5)
Creatinine, Ser: 1 mg/dL (ref 0.4–1.5)
GFR: 109.61 mL/min (ref >60.00)

## 2013-06-30 MED ORDER — SITAGLIPTIN PHOS-METFORMIN HCL 50-500 MG PO TABS: 1.0000 | ORAL_TABLET | Freq: Two times a day (BID) | ORAL | Status: DC

## 2013-06-30 MED ORDER — LOSARTAN POTASSIUM 100 MG PO TABS: 100.0000 mg | ORAL_TABLET | Freq: Every day | ORAL | Status: DC

## 2013-06-30 MED ORDER — GLIMEPIRIDE 4 MG PO TABS: 4.0000 mg | ORAL_TABLET | Freq: Every day | ORAL | Status: DC

## 2013-06-30 NOTE — Assessment & Plan Note (Signed)
On losartan 50 mg daily, BP elevated today, no ambulatory BPs. Plan: BMP, increase losartan to 100 mg, amb BPs

## 2013-06-30 NOTE — Progress Notes (Signed)
  Subjective:    Patient ID: Michael Willis, male    DOB: 05-Apr-1976, 37 y.o.   MRN: [id number]  HPI Diabetes followup Last A1c was very high, my idea was to add Amaryl to his regimen however he discontinue Janumet, apparently a  misunderstanding. No ambulatory BPs or CBGs.  Past Medical History  Diagnosis Date  . Obesity   . Diabetes 01/05/2013  . Tinea pedis, onychomycosis 01/05/2013  . Elevated LFTs 01/05/2013   Past Surgical History  Procedure Laterality Date  . No past surgeries       Review of Systems Denies any blurred vision or increase in thirst;  urinary frequency is at baseline.     Objective:   Physical Exam BP 154/100  Pulse 78  Temp(Src) 98 F (36.7 C)  Wt 307 lb (139.254 kg)  SpO2 95% General -- alert, well-developed, NAD.   Lungs -- normal respiratory effort, no intercostal retractions, no accessory muscle use, and normal breath sounds.  Heart-- normal rate, regular rhythm, no murmur.   Extremities-- no pretibial edema bilaterally  Neurologic--  alert & oriented X3.   Psych-- Cognition and judgment appear intact. Cooperative with normal attention span and concentration. No anxious appearing , no depressed appearing.      Assessment & Plan:  Today , I spent more than 20  min with the patient, >50% of the time counseling

## 2013-06-30 NOTE — Assessment & Plan Note (Addendum)
Last A1c was extremely high, my intention was to add Amaryl to  janumet but he is currently taking only amaryl, no ambulatory CBGs. Plan:  Restart janumet I suspect he will need more than 2 mg of amaryl, increased to 4 mg Ambulatory CBGs, call w/ reading in 2 weeks  follow up in 2 months Low sugar sx provided

## 2013-06-30 NOTE — Progress Notes (Signed)
Pre visit review using our clinic review tool, if applicable. No additional management support is needed unless otherwise documented below in the visit note. 

## 2013-06-30 NOTE — Patient Instructions (Signed)
Get your blood work before you leave  Next visit in 2 months for for a diabetes hypertension   follow up (30 minutes) . No Fasting Please make an appointment    Check the  blood pressure 2 or 3 times a Week be sure it is between 110/60 and 140/85. Ideal blood pressure is 120/80. If it is consistently higher or lower, let me know  Check your blood sugars twice a day: always in the morning before breakfast, then  after a meal. Call w/ blood sugar readings in 2 weeks   [facility] web site for Diabetes StagedNews.no  "[facility] Diabetes diet" book

## 2013-07-03 ENCOUNTER — Encounter: Payer: Self-pay | Admitting: *Deleted

## 2013-07-15 ENCOUNTER — Telehealth: Payer: Self-pay | Admitting: *Deleted

## 2013-07-15 ENCOUNTER — Other Ambulatory Visit: Payer: Self-pay | Admitting: *Deleted

## 2013-07-15 MED ORDER — SITAGLIPTIN PHOS-METFORMIN HCL 50-500 MG PO TABS: 1.0000 | ORAL_TABLET | Freq: Two times a day (BID) | ORAL | Status: DC

## 2013-07-15 NOTE — Telephone Encounter (Signed)
Janumet prescription sent to [facility]. Attempted to contact patient, but his voicemail has not been set up.

## 2013-07-15 NOTE — Telephone Encounter (Signed)
Pt called and stated that his Janumet was not sent to pharmacy. Epic shows that the prescription was printed. Pt states he does not have it. Is it ok if we send the prescription electronically to [facility] on [address]?

## 2013-07-15 NOTE — Telephone Encounter (Signed)
Janumet sent to patients pharmacy

## 2013-07-15 NOTE — Telephone Encounter (Signed)
Please do

## 2013-08-14 ENCOUNTER — Telehealth: Payer: Self-pay

## 2013-08-14 NOTE — Telephone Encounter (Signed)
Patient presents to the office for samples of Janumet 50-500mg  until his next appt in January. Samples given. Logged in book.

## 2013-09-04 ENCOUNTER — Ambulatory Visit (INDEPENDENT_AMBULATORY_CARE_PROVIDER_SITE_OTHER): Payer: BC Managed Care – PPO | Admitting: Internal Medicine

## 2013-09-04 ENCOUNTER — Encounter: Payer: Self-pay | Admitting: Internal Medicine

## 2013-09-04 VITALS — BP 146/85 | HR 87 | Temp 97.9°F | Wt 326.0 lb

## 2013-09-04 DIAGNOSIS — E119 Type 2 diabetes mellitus without complications: Secondary | ICD-10-CM

## 2013-09-04 DIAGNOSIS — IMO0001 Reserved for inherently not codable concepts without codable children: Secondary | ICD-10-CM

## 2013-09-04 DIAGNOSIS — R030 Elevated blood-pressure reading, without diagnosis of hypertension: Secondary | ICD-10-CM

## 2013-09-04 LAB — BASIC METABOLIC PANEL
BUN: 14 mg/dL (ref 6–23)
CO2: 28 meq/L (ref 19–32)
Calcium: 9.1 mg/dL (ref 8.4–10.5)
Chloride: 104 mEq/L (ref 96–112)
Creatinine, Ser: 1 mg/dL (ref 0.4–1.5)
GFR: 113.46 mL/min (ref >60.00)
GLUCOSE: 138 mg/dL — AB (ref 70–99)
POTASSIUM: 3.9 meq/L (ref 3.5–5.1)
SODIUM: 139 meq/L (ref 135–145)

## 2013-09-04 LAB — AST: AST: 27 U/L (ref 0–37)

## 2013-09-04 LAB — HEMOGLOBIN A1C: Hgb A1c MFr Bld: 6.9 % — ABNORMAL HIGH (ref 4.6–6.5)

## 2013-09-04 LAB — ALT: ALT: 52 U/L (ref 0–53)

## 2013-09-04 NOTE — Patient Instructions (Addendum)
Get your blood work before you leave   Next visit is for routine check up regards diabetes, hypertension   in 3 months, fasting Please make an appointment     Two great books to learn about diabetes Diabetes fro Dummies "[facility] Diabetes diet" book

## 2013-09-04 NOTE — Progress Notes (Signed)
Pre visit review using our clinic review tool, if applicable. No additional management support is needed unless otherwise documented below in the visit note. 

## 2013-09-04 NOTE — Assessment & Plan Note (Signed)
Good compliance with medication, encourage diet , exercise,  self learning. Recommend to check CBGs once daily Recommend not to run out of medication Labs Followup in 3 months

## 2013-09-04 NOTE — Progress Notes (Signed)
   Subjective:    Patient ID: Michael Willis, male    DOB: [birth year], 38 y.o.   MRN: [id number]  HPI F/u from previous visit Good compliance with medication. No ambulatory BPs or CBGs . Diet has not been the best, has not improved. He is still active at work, no routine exercise  Past Medical History  Diagnosis Date  . Obesity   . Diabetes 01/05/2013  . Tinea pedis, onychomycosis 01/05/2013  . Elevated LFTs 01/05/2013  . HTN (hypertension) 12/12/2012   Past Surgical History  Procedure Laterality Date  . No past surgeries      Review of Systems No symptoms consistent with low blood sugars Vision normal No chest pain or shortness of breath    Objective:   Physical Exam BP 146/85  Pulse 87  Temp(Src) 97.9 F (36.6 C)  Wt 326 lb (147.873 kg)  SpO2 97% General -- alert, well-developed, NAD. Lungs -- normal respiratory effort, no intercostal retractions, no accessory muscle use, and normal breath sounds.  Heart-- normal rate, regular rhythm, no murmur.   Extremities-- no pretibial edema bilaterally  Psych-- Cognition and judgment appear intact. Cooperative with normal attention span and concentration. No anxious or depressed appearing.     Assessment & Plan:

## 2013-09-04 NOTE — Assessment & Plan Note (Signed)
Good compliance with medications, not ambulatory BPs, BP today is satisfactory, no change, labs

## 2013-09-07 ENCOUNTER — Encounter: Payer: Self-pay | Admitting: *Deleted

## 2013-09-07 ENCOUNTER — Telehealth: Payer: Self-pay

## 2013-09-07 NOTE — Telephone Encounter (Signed)
Relevant patient education assigned to patient using Emmi. ° °

## 2013-11-01 ENCOUNTER — Other Ambulatory Visit: Payer: Self-pay | Admitting: Internal Medicine

## 2013-11-17 ENCOUNTER — Other Ambulatory Visit: Payer: Self-pay | Admitting: Internal Medicine

## 2013-12-04 ENCOUNTER — Ambulatory Visit (INDEPENDENT_AMBULATORY_CARE_PROVIDER_SITE_OTHER): Payer: BC Managed Care – PPO | Admitting: Internal Medicine

## 2013-12-04 ENCOUNTER — Encounter: Payer: Self-pay | Admitting: Internal Medicine

## 2013-12-04 VITALS — BP 136/90 | HR 88 | Temp 97.9°F | Wt 326.0 lb

## 2013-12-04 DIAGNOSIS — E785 Hyperlipidemia, unspecified: Secondary | ICD-10-CM

## 2013-12-04 DIAGNOSIS — I10 Essential (primary) hypertension: Secondary | ICD-10-CM

## 2013-12-04 DIAGNOSIS — E119 Type 2 diabetes mellitus without complications: Secondary | ICD-10-CM

## 2013-12-04 HISTORY — DX: Hyperlipidemia, unspecified: E78.5

## 2013-12-04 LAB — LIPID PANEL
Cholesterol: 302 mg/dL — ABNORMAL HIGH (ref 0–200)
HDL: 42.9 mg/dL (ref >39.00)
LDL CALC: 68 mg/dL (ref 0–99)
TRIGLYCERIDES: 956 mg/dL — AB (ref 0.0–149.0)
Total CHOL/HDL Ratio: 7
VLDL: 191.2 mg/dL — ABNORMAL HIGH (ref 0.0–40.0)

## 2013-12-04 LAB — HEMOGLOBIN A1C: HEMOGLOBIN A1C: 7.9 % — AB (ref 4.6–6.5)

## 2013-12-04 LAB — AST: AST: 38 U/L — ABNORMAL HIGH (ref 0–37)

## 2013-12-04 LAB — ALT: ALT: 88 U/L — ABNORMAL HIGH (ref 0–53)

## 2013-12-04 MED ORDER — LOSARTAN POTASSIUM 100 MG PO TABS: ORAL_TABLET | ORAL | Status: DC

## 2013-12-04 MED ORDER — GLIMEPIRIDE 4 MG PO TABS: ORAL_TABLET | ORAL | Status: DC

## 2013-12-04 MED ORDER — SITAGLIPTIN PHOS-METFORMIN HCL 50-500 MG PO TABS: 1.0000 | ORAL_TABLET | Freq: Two times a day (BID) | ORAL | Status: DC

## 2013-12-04 NOTE — Progress Notes (Signed)
   Subjective:    Patient ID: Liston Albaeven A Ermis, male    DOB: 08-Aug-1976, 38 y.o.   MRN: [id number]  DOS:  12/04/2013 Type of  visit: Routine office visit Diabetes--good compliance with medications, janumet is expensive. No recent ambulatory CBGs. Hypertension, BP today 156/90, no recent ambulatory BPs. Hyperlipidemia, due for labs   ROS Has not been exercising lately, has gained weight. Diet remains satisfactory Denies chest pain, difficulty breathing no LE edema Denies nausea, vomiting, diarrhea. No cough or sputum production. No paresthesias in the lower extremities  Past Medical History  Diagnosis Date  . Obesity   . Diabetes 01/05/2013  . Tinea pedis, onychomycosis 01/05/2013  . Elevated LFTs 01/05/2013  . HTN (hypertension) 12/12/2012  . Hyperlipidemia 12/04/2013    Past Surgical History  Procedure Laterality Date  . No past surgeries      History   Social History  . Marital Status: Single    Spouse Name: N/A    Number of Children: 5  . 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 Smoking status: Former Smoker -- 6 years    Types: Cigars    Quit date: 09/18/2000  . Smokeless tobacco: Never Used     Comment: smoke a few cigars off an on .   Marland Kitchen. Alcohol Use: Yes     Comment: socially  . Drug Use: No  . Sexual Activity: Not on file   Other Topics Concern  . Not on file   Social History Narrative   Has a GF, live together .   Exercise: none   Diet: regular        Medication List       This list is accurate as of: 12/04/13 11:59 PM.  Always use your most recent med list.               glimepiride 4 MG tablet  Commonly known as:  AMARYL  Take one tablet daily before breakfast. DUE for appointment [phone].please schedule.     losartan 100 MG tablet  Commonly known as:  COZAAR  TAKE ONE TABLET BY MOUTH ONCE DAILY     sitaGLIPtin-metformin 50-500 MG per tablet    Commonly known as:  JANUMET  Take 1 tablet by mouth 2 (two) times daily with a meal.           Objective:   Physical Exam BP 136/90  Pulse 88  Temp(Src) 97.9 F (36.6 C)  Wt 326 lb (147.873 kg)  SpO2 97% General -- alert, well-developed, NAD.  Lungs -- normal respiratory effort, no intercostal retractions, no accessory muscle use, and normal breath sounds.  Heart-- normal rate, regular rhythm, no murmur.  DIABETIC FEET EXAM: No lower extremity edema Normal pedal pulses bilaterally Skin dry w/o lesions, nails slt thick, no calluses Pinprick examination of the feet normal. Psych-- Cognition and judgment appear intact. Cooperative with normal attention span and concentration. No anxious or depressed appearing.     Assessment & Plan:

## 2013-12-04 NOTE — Assessment & Plan Note (Signed)
Last A1c was great, since then he is not exercising much and has gained weight. Plan: Refill medications, discount coupon provided for janumet Encourage to check CBGs Eye care discussed Feet exam today negative Encourage to go back to more exercise , weight loss

## 2013-12-04 NOTE — Assessment & Plan Note (Addendum)
Last cholesterol panel showed severe hyperlipidemia, will check a FLP today, anticipate it will better

## 2013-12-04 NOTE — Progress Notes (Signed)
Pre visit review using our clinic review tool, if applicable. No additional management support is needed unless otherwise documented below in the visit note. 

## 2013-12-04 NOTE — Patient Instructions (Signed)
Please come back next week fasting for blood work: FLP, AST, ALT---- hyperlipidemia A1c ---diabetes  You need to see the  eye doctor every year.  Check the  blood pressure   Weekly  be sure it is between 110/60 and 140/85. Ideal blood pressure is 120/80. If it is consistently higher or lower, let me know  Checked her blood sugars daily if possible: sometimes fasting, sometimes 2 hours after meal. If the readings are more than 160, let us know.   Next visit is for a physical exam in 3 months   fasting Please make an appointment

## 2013-12-04 NOTE — Assessment & Plan Note (Signed)
BP slightly elevated today, no ambulatory BPs. Plan: Continue present care, check ambulatory BPs.

## 2013-12-07 ENCOUNTER — Telehealth: Payer: Self-pay | Admitting: Internal Medicine

## 2013-12-07 NOTE — Telephone Encounter (Signed)
Relevant patient education assigned to patient using Emmi. ° °

## 2013-12-09 MED ORDER — ATORVASTATIN CALCIUM 40 MG PO TABS: 40.0000 mg | ORAL_TABLET | Freq: Every day | ORAL | Status: DC

## 2013-12-09 NOTE — Addendum Note (Signed)
Addended by: Verdie ShireBAYNES, Roczen Waymire M on: 12/09/2013 01:23 PM   Modules accepted: Orders

## 2014-01-20 ENCOUNTER — Other Ambulatory Visit: Payer: BC Managed Care – PPO

## 2014-02-07 ENCOUNTER — Telehealth: Payer: Self-pay | Admitting: Internal Medicine

## 2014-02-07 DIAGNOSIS — E785 Hyperlipidemia, unspecified: Secondary | ICD-10-CM

## 2014-02-07 NOTE — Telephone Encounter (Signed)
Was prescribed Lipitor 2 months ago, due for labs. Please arrange FLP, AST, ALT -------- dx  hyperlipidemia

## 2014-02-08 NOTE — Telephone Encounter (Signed)
Unable to lmovm. Will try again.

## 2014-02-09 MED ORDER — ATORVASTATIN CALCIUM 40 MG PO TABS: 40.0000 mg | ORAL_TABLET | Freq: Every day | ORAL | Status: DC

## 2014-02-09 NOTE — Telephone Encounter (Signed)
Spoke with patient who stated that he did not know he needed to take Lipitor, I advised that due to his elevated chol he was supposed to have started this in April. He stated he will start it tomorrow after he returns to town. . Lab appt made for 04/06/14 at 8am. Future orders placed.

## 2014-04-06 ENCOUNTER — Other Ambulatory Visit (INDEPENDENT_AMBULATORY_CARE_PROVIDER_SITE_OTHER): Payer: BC Managed Care – PPO

## 2014-04-06 ENCOUNTER — Encounter: Payer: Self-pay | Admitting: *Deleted

## 2014-04-06 DIAGNOSIS — E785 Hyperlipidemia, unspecified: Secondary | ICD-10-CM

## 2014-04-06 LAB — LIPID PANEL
Cholesterol: 162 mg/dL (ref 0–200)
HDL: 34.8 mg/dL — ABNORMAL LOW (ref >39.00)
NonHDL: 127.2
Total CHOL/HDL Ratio: 5
Triglycerides: 323 mg/dL — ABNORMAL HIGH (ref 0.0–149.0)
VLDL: 64.6 mg/dL — ABNORMAL HIGH (ref 0.0–40.0)

## 2014-04-06 LAB — AST: AST: 29 U/L (ref 0–37)

## 2014-04-06 LAB — ALT: ALT: 61 U/L — ABNORMAL HIGH (ref 0–53)

## 2014-04-06 LAB — LDL CHOLESTEROL, DIRECT: LDL DIRECT: 85 mg/dL

## 2014-04-28 ENCOUNTER — Other Ambulatory Visit: Payer: Self-pay

## 2014-04-28 ENCOUNTER — Telehealth: Payer: Self-pay | Admitting: Internal Medicine

## 2014-04-28 MED ORDER — LOSARTAN POTASSIUM 100 MG PO TABS: ORAL_TABLET | ORAL | Status: DC

## 2014-04-28 NOTE — Telephone Encounter (Signed)
Caller name: Angelgabriel Relation to pt: self  Call back number: [phone] Pharmacy: [phone] [facility] [address] [place], [place], [place] [address] *please send rx to this pharmacy"   Reason for call:   Pt lost blood pressure medication requesting another script please send to [facility] [phone]   (pt has no blood pressure medication)

## 2014-04-28 NOTE — Telephone Encounter (Signed)
Pt informed that medication has been sent to Pharmacy.

## 2014-05-03 ENCOUNTER — Telehealth: Payer: Self-pay

## 2014-05-06 NOTE — Telephone Encounter (Signed)
No encounter

## 2014-05-26 ENCOUNTER — Telehealth: Payer: Self-pay | Admitting: Internal Medicine

## 2014-05-26 ENCOUNTER — Other Ambulatory Visit: Payer: Self-pay

## 2014-05-26 MED ORDER — SITAGLIPTIN PHOS-METFORMIN HCL 50-500 MG PO TABS: 1.0000 | ORAL_TABLET | Freq: Two times a day (BID) | ORAL | Status: DC

## 2014-05-26 NOTE — Telephone Encounter (Signed)
Caller name: Braxxton Relation to pt: self  Call back number: [phone] [facility] [address]  Reason for call:   Patient is requesting a refill of janumet

## 2014-05-26 NOTE — Telephone Encounter (Signed)
Medication sent to Pharmacy, Pt needs appt for any further refills.

## 2014-06-23 ENCOUNTER — Other Ambulatory Visit: Payer: Self-pay

## 2014-06-23 DIAGNOSIS — E785 Hyperlipidemia, unspecified: Secondary | ICD-10-CM

## 2014-06-23 MED ORDER — ATORVASTATIN CALCIUM 40 MG PO TABS: 40.0000 mg | ORAL_TABLET | Freq: Every day | ORAL | Status: DC

## 2014-06-29 ENCOUNTER — Other Ambulatory Visit: Payer: Self-pay

## 2014-06-29 DIAGNOSIS — E785 Hyperlipidemia, unspecified: Secondary | ICD-10-CM

## 2014-06-29 MED ORDER — ATORVASTATIN CALCIUM 40 MG PO TABS: 40.0000 mg | ORAL_TABLET | Freq: Every day | ORAL | Status: DC

## 2014-06-29 MED ORDER — SITAGLIPTIN PHOS-METFORMIN HCL 50-500 MG PO TABS: 1.0000 | ORAL_TABLET | Freq: Two times a day (BID) | ORAL | Status: DC

## 2014-07-07 ENCOUNTER — Ambulatory Visit (INDEPENDENT_AMBULATORY_CARE_PROVIDER_SITE_OTHER): Payer: BC Managed Care – PPO | Admitting: Internal Medicine

## 2014-07-07 ENCOUNTER — Encounter: Payer: Self-pay | Admitting: Internal Medicine

## 2014-07-07 VITALS — BP 128/84 | HR 88 | Temp 98.2°F | Wt 302.4 lb

## 2014-07-07 DIAGNOSIS — E119 Type 2 diabetes mellitus without complications: Secondary | ICD-10-CM

## 2014-07-07 DIAGNOSIS — R945 Abnormal results of liver function studies: Secondary | ICD-10-CM

## 2014-07-07 DIAGNOSIS — Z0000 Encounter for general adult medical examination without abnormal findings: Secondary | ICD-10-CM

## 2014-07-07 DIAGNOSIS — E785 Hyperlipidemia, unspecified: Secondary | ICD-10-CM

## 2014-07-07 DIAGNOSIS — R7989 Other specified abnormal findings of blood chemistry: Secondary | ICD-10-CM

## 2014-07-07 DIAGNOSIS — I10 Essential (primary) hypertension: Secondary | ICD-10-CM

## 2014-07-07 MED ORDER — ATORVASTATIN CALCIUM 40 MG PO TABS: 40.0000 mg | ORAL_TABLET | Freq: Every day | ORAL | Status: DC

## 2014-07-07 MED ORDER — SITAGLIPTIN PHOS-METFORMIN HCL 50-500 MG PO TABS: 1.0000 | ORAL_TABLET | Freq: Two times a day (BID) | ORAL | Status: DC

## 2014-07-07 MED ORDER — LOSARTAN POTASSIUM 100 MG PO TABS: ORAL_TABLET | ORAL | Status: DC

## 2014-07-07 MED ORDER — GLIMEPIRIDE 4 MG PO TABS: ORAL_TABLET | ORAL | Status: DC

## 2014-07-07 NOTE — Assessment & Plan Note (Signed)
Good compliance with medication except for the last few days because he ran out. Prescription sent. No ambulatory CBGs, he does have a glucometer, encouraged him to start checking. Has polyuria and polydipsia, has lost weight, diet and exercise is slightly improved. I suspect his blood sugar may not be well-controlled, we are checking an A1c in few days, further advice would results.

## 2014-07-07 NOTE — Progress Notes (Signed)
Pre visit review using our clinic review tool, if applicable. No additional management support is needed unless otherwise documented below in the visit note. 

## 2014-07-07 NOTE — Assessment & Plan Note (Addendum)
Out of  Losartan x few days,  restart meds and  check a BMP, see instructions

## 2014-07-07 NOTE — Patient Instructions (Addendum)
Restart her medications today  Diabetes: Check your blood sugar 3-4  times a week at different times   GOALS: Fasting before a meal 70- 130 2 hours after a meal less than 180 At bedtime 90-150 Call if consistently not at goal     Stop by the front desk and schedule labs to be done wby 07-19-2014  (fasting)

## 2014-07-07 NOTE — Progress Notes (Signed)
   Subjective:    Patient ID: Michael Willis, male    DOB: 12-Sep-1975, 38 y.o.   MRN: [id number]  DOS:  07/07/2014 Type of visit - description :  Routine visit, here with his wife Interval history: Good compliance with medications except for the last week where he ran out of Janumet and his cholesterol medication. No ambulatory BPs, no ambulatory CBGs Reports that he urinates a lot and is very thirsty. Has lost weight.  ROS Denies chest pain or difficulty breathing No nausea, vomiting, diarrhea  Past Medical History  Diagnosis Date  . Obesity   . Diabetes 01/05/2013  . Tinea pedis, onychomycosis 01/05/2013  . Elevated LFTs 01/05/2013  . HTN (hypertension) 12/12/2012  . Hyperlipidemia 12/04/2013    Past Surgical History  Procedure Laterality Date  . No past surgeries      History   Social History  . Marital Status: Single    Spouse Name: N/A    Number of Children: 5  . 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 Smoking status: Former Smoker -- 6 years    Types: Cigars    Quit date: 09/18/2000  . Smokeless tobacco: Never Used     Comment: smoke a few cigars off an on .   Marland Kitchen. Alcohol Use: Yes     Comment: socially  . Drug Use: No  . Sexual Activity: Not on file   Other Topics Concern  . Not on file   Social History Narrative   Has a GF, live together .   Exercise: none   Diet: regular        Medication List       This list is accurate as of: 07/07/14 10:33 AM.  Always use your most recent med list.               atorvastatin 40 MG tablet  Commonly known as:  LIPITOR  Take 1 tablet (40 mg total) by mouth daily. DUE FOR APPT WITH DR Daiveon Markman FOR ANY FURTHER REFILLS. [phone].     glimepiride 4 MG tablet  Commonly known as:  AMARYL  Take one tablet daily before breakfast. DUE for appointment [phone].please schedule.     losartan 100 MG tablet  Commonly known as:  COZAAR    TAKE ONE TABLET BY MOUTH ONCE DAILY     sitaGLIPtin-metformin 50-500 MG per tablet  Commonly known as:  JANUMET  Take 1 tablet by mouth 2 (two) times daily with a meal. Take 1 tablet by mouth 2 times daily. OVERDUE FOR APPT WITH DR Wilbur Labuda [phone].           Objective:   Physical Exam BP 128/84 mmHg  Pulse 88  Temp(Src) 98.2 F (36.8 C) (Oral)  Wt 302 lb 6 oz (137.156 kg)  SpO2 97%  General -- alert, well-developed, NAD.   Lungs -- normal respiratory effort, no intercostal retractions, no accessory muscle use, and normal breath sounds.  Heart-- normal rate, regular rhythm, no murmur.   Extremities-- no pretibial edema bilaterally  Neurologic--  alert & oriented X3. Speech normal, gait appropriate for age, strength symmetric and appropriate for age.  Psych-- Cognition and judgment appear intact. Cooperative with normal attention span and concentration. No anxious or depressed appearing.       Assessment & Plan:   I described the benefits of a flu shot in detail, declined

## 2014-07-07 NOTE — Assessment & Plan Note (Signed)
Good compliance with cholesterol medication except in the last few days, check FLP in few days

## 2014-07-19 ENCOUNTER — Other Ambulatory Visit (INDEPENDENT_AMBULATORY_CARE_PROVIDER_SITE_OTHER): Payer: BC Managed Care – PPO

## 2014-07-19 DIAGNOSIS — I10 Essential (primary) hypertension: Secondary | ICD-10-CM

## 2014-07-19 DIAGNOSIS — E785 Hyperlipidemia, unspecified: Secondary | ICD-10-CM

## 2014-07-19 DIAGNOSIS — E119 Type 2 diabetes mellitus without complications: Secondary | ICD-10-CM

## 2014-07-19 LAB — BASIC METABOLIC PANEL
BUN: 12 mg/dL (ref 6–23)
CO2: 25 mEq/L (ref 19–32)
Calcium: 9 mg/dL (ref 8.4–10.5)
Chloride: 99 mEq/L (ref 96–112)
Creatinine, Ser: 1 mg/dL (ref 0.4–1.5)
GFR: 102.96 mL/min (ref >60.00)
GLUCOSE: 269 mg/dL — AB (ref 70–99)
POTASSIUM: 4 meq/L (ref 3.5–5.1)
Sodium: 133 mEq/L — ABNORMAL LOW (ref 135–145)

## 2014-07-19 LAB — LIPID PANEL
CHOLESTEROL: 190 mg/dL (ref 0–200)
HDL: 27.9 mg/dL — ABNORMAL LOW (ref >39.00)
NONHDL: 162.1
TRIGLYCERIDES: 621 mg/dL — AB (ref 0.0–149.0)
Total CHOL/HDL Ratio: 7
VLDL: 124.2 mg/dL — ABNORMAL HIGH (ref 0.0–40.0)

## 2014-07-19 LAB — HEMOGLOBIN A1C: Hgb A1c MFr Bld: 11 % — ABNORMAL HIGH (ref 4.6–6.5)

## 2014-07-20 LAB — LDL CHOLESTEROL, DIRECT: Direct LDL: 61.5 mg/dL

## 2014-07-21 MED ORDER — SITAGLIPTIN PHOS-METFORMIN HCL 50-1000 MG PO TABS: 1.0000 | ORAL_TABLET | Freq: Two times a day (BID) | ORAL | Status: DC

## 2014-07-21 NOTE — Addendum Note (Signed)
Addended by: Dorette GrateFAULKNER, Reece Mcbroom C on: 07/21/2014 10:16 AM   Modules accepted: Orders, Medications

## 2014-09-05 ENCOUNTER — Telehealth: Payer: Self-pay | Admitting: Internal Medicine

## 2014-09-05 NOTE — Telephone Encounter (Signed)
Advise pt, needs ROV ref DM HTN, please arrange

## 2014-09-06 NOTE — Telephone Encounter (Signed)
Appointment reminder letter printed and mailed to Pt.

## 2014-10-09 ENCOUNTER — Other Ambulatory Visit: Payer: Self-pay | Admitting: Internal Medicine

## 2014-10-12 ENCOUNTER — Ambulatory Visit (INDEPENDENT_AMBULATORY_CARE_PROVIDER_SITE_OTHER): Payer: BLUE CROSS/BLUE SHIELD | Admitting: Internal Medicine

## 2014-10-12 ENCOUNTER — Encounter: Payer: Self-pay | Admitting: Internal Medicine

## 2014-10-12 VITALS — BP 123/82 | HR 105 | Temp 98.3°F | Wt 311.0 lb

## 2014-10-12 DIAGNOSIS — E119 Type 2 diabetes mellitus without complications: Secondary | ICD-10-CM

## 2014-10-12 LAB — BASIC METABOLIC PANEL
BUN: 17 mg/dL (ref 6–23)
CHLORIDE: 97 meq/L (ref 96–112)
CO2: 30 meq/L (ref 19–32)
Calcium: 9.8 mg/dL (ref 8.4–10.5)
Creatinine, Ser: 1.18 mg/dL (ref 0.40–1.50)
GFR: 88.88 mL/min (ref >60.00)
Glucose, Bld: 300 mg/dL — ABNORMAL HIGH (ref 70–99)
Potassium: 4.1 mEq/L (ref 3.5–5.1)
Sodium: 135 mEq/L (ref 135–145)

## 2014-10-12 LAB — AST: AST: 29 U/L (ref 0–37)

## 2014-10-12 LAB — HEMOGLOBIN A1C: Hgb A1c MFr Bld: 9.2 % — ABNORMAL HIGH (ref 4.6–6.5)

## 2014-10-12 LAB — ALT: ALT: 63 U/L — AB (ref 0–53)

## 2014-10-12 NOTE — Progress Notes (Signed)
Pre visit review using our clinic review tool, if applicable. No additional management support is needed unless otherwise documented below in the visit note. 

## 2014-10-12 NOTE — Assessment & Plan Note (Signed)
Last A1c 11, Janumet dose increased. The patient is not checking ambulatory CBGs. We had a long conversation about the need to diet correctly and start checking his blood sugars. He is very active at work, exercise is not a problem. Long term complications of diabetes including eye disease, CAD, strokes discussed as well. Plan: Labs refer to a nutritionist start checking ambulatory blood sugars at least once a day follow-up 3 months. Adjust medication if needed based on A1c. Also, triglycerides were quite elevated, will recheck when he comes back to the office

## 2014-10-12 NOTE — Progress Notes (Signed)
   Subjective:    Patient ID: Michael Willis, male    DOB: 1976/07/07, 39 y.o.   MRN: [id number]  DOS:  10/12/2014 Type of visit - description : dm f/u Interval history: Last A1C was elevated, Janumet dose increase, good compliance w/ medication. Patient reports diet has improved, states he is eating more salads, fruits and less fried food. He remains quite active at work No ambulatory CBGs  Review of Systems Polyuria has decrease, no blurred vision. Denies nausea, vomiting, diarrhea. No lower extremity edema  Past Medical History  Diagnosis Date  . Obesity   . Diabetes 01/05/2013  . Tinea pedis, onychomycosis 01/05/2013  . Elevated LFTs 01/05/2013  . HTN (hypertension) 12/12/2012  . Hyperlipidemia 12/04/2013    Past Surgical History  Procedure Laterality Date  . No past surgeries      History   Social History  . Marital Status: Single    Spouse Name: N/A  . Number of Children: 5  . 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 Smoking status: Former Smoker -- 6 years    Types: Cigars    Quit date: 09/18/2000  . Smokeless tobacco: Never Used     Comment: smoke a few cigars off an on .   Marland Kitchen. Alcohol Use: Yes     Comment: socially  . Drug Use: No  . Sexual Activity: Not on file   Other Topics Concern  . Not on file   Social History Narrative   Has a GF, live together .   Exercise: none   Diet: regular        Medication List       This list is accurate as of: 10/12/14 11:59 PM.  Always use your most recent med list.               atorvastatin 40 MG tablet  Commonly known as:  LIPITOR  Take 1 tablet (40 mg total) by mouth daily.     canagliflozin 100 MG Tabs tablet  Commonly known as:  INVOKANA  Take 1 tablet (100 mg total) by mouth daily.     glimepiride 4 MG tablet  Commonly known as:  AMARYL  Take one tablet daily before breakfast.     JANUMET 50-1000 MG per tablet    Generic drug:  sitaGLIPtin-metformin  TAKE ONE TABLET BY MOUTH TWICE DAILY WITH MEALS     losartan 100 MG tablet  Commonly known as:  COZAAR  TAKE ONE TABLET BY MOUTH ONCE DAILY           Objective:   Physical Exam BP 123/82 mmHg  Pulse 105  Temp(Src) 98.3 F (36.8 C) (Oral)  Wt 311 lb (141.069 kg)  SpO2 94% General:   Well developed, well nourished . NAD.  HEENT:  Normocephalic . Face symmetric, atraumatic DIABETIC FEET EXAM: No lower extremity edema Normal pedal pulses bilaterally Skin normal, nails slightly dystrophic Pinprick examination of the feet normal. Neurologic:  alert & oriented X3.  Speech normal, gait appropriate for age and unassisted Psych--  Cognition and judgment appear intact.  Cooperative with normal attention span and concentration.  Behavior appropriate. No anxious or depressed appearing.      Assessment & Plan:

## 2014-10-12 NOTE — Patient Instructions (Signed)
Get your blood work before you leave   Diabetes: Check your blood sugar  once a day   at different times of the day  GOALS: Fasting before a meal 70- 130 2 hours after a meal less than 180 At bedtime 90-150 Call if consistently not at goal Call in 2 weeks and let us know how you are doing  Come back in 3 months  for a  office visit     Come back fasting

## 2014-10-14 MED ORDER — CANAGLIFLOZIN 100 MG PO TABS: 100.0000 mg | ORAL_TABLET | Freq: Every day | ORAL | Status: DC

## 2014-10-18 ENCOUNTER — Telehealth: Payer: Self-pay | Admitting: Internal Medicine

## 2014-10-18 NOTE — Telephone Encounter (Signed)
I placed a discount card in the mail for his Invokana on 10/14/2014, hopefully he will be receiving that in the mail soon.

## 2014-10-18 NOTE — Telephone Encounter (Signed)
The new medicine (he does not know the name) is too expensive.  It is $30.00.  Do you have any discount cards for this med

## 2015-01-13 ENCOUNTER — Other Ambulatory Visit: Payer: Self-pay | Admitting: Internal Medicine

## 2015-01-27 ENCOUNTER — Telehealth: Payer: Self-pay

## 2015-01-27 NOTE — Telephone Encounter (Signed)
Letter printed and mailed to Pt.  

## 2015-01-27 NOTE — Telephone Encounter (Signed)
-----   Message from Wanda Plump, MD sent at 01/27/2015  4:38 PM EDT ----- Regarding: Letter Please send him a letter, he is overdue for a routine checkup for diabetes

## 2015-02-14 ENCOUNTER — Other Ambulatory Visit: Payer: Self-pay | Admitting: Internal Medicine

## 2015-02-17 ENCOUNTER — Other Ambulatory Visit: Payer: Self-pay | Admitting: Internal Medicine

## 2015-03-01 ENCOUNTER — Other Ambulatory Visit: Payer: Self-pay | Admitting: Internal Medicine

## 2015-03-10 ENCOUNTER — Encounter: Payer: Self-pay | Admitting: Internal Medicine

## 2015-03-10 ENCOUNTER — Ambulatory Visit (INDEPENDENT_AMBULATORY_CARE_PROVIDER_SITE_OTHER): Payer: BLUE CROSS/BLUE SHIELD | Admitting: Internal Medicine

## 2015-03-10 VITALS — BP 134/84 | HR 85 | Temp 97.6°F | Ht 72.0 in | Wt 317.4 lb

## 2015-03-10 DIAGNOSIS — B353 Tinea pedis: Secondary | ICD-10-CM | POA: Diagnosis not present

## 2015-03-10 DIAGNOSIS — M2011 Hallux valgus (acquired), right foot: Secondary | ICD-10-CM | POA: Diagnosis not present

## 2015-03-10 DIAGNOSIS — I10 Essential (primary) hypertension: Secondary | ICD-10-CM | POA: Diagnosis not present

## 2015-03-10 DIAGNOSIS — E119 Type 2 diabetes mellitus without complications: Secondary | ICD-10-CM

## 2015-03-10 DIAGNOSIS — M21611 Bunion of right foot: Secondary | ICD-10-CM

## 2015-03-10 DIAGNOSIS — M21612 Bunion of left foot: Secondary | ICD-10-CM

## 2015-03-10 DIAGNOSIS — M2012 Hallux valgus (acquired), left foot: Secondary | ICD-10-CM

## 2015-03-10 LAB — CBC WITH DIFFERENTIAL/PLATELET
Basophils Absolute: 0.1 10*3/uL (ref 0.0–0.1)
Basophils Relative: 0.4 % (ref 0.0–3.0)
EOS PCT: 1.7 % (ref 0.0–5.0)
Eosinophils Absolute: 0.2 10*3/uL (ref 0.0–0.7)
HCT: 44.9 % (ref 39.0–52.0)
HEMOGLOBIN: 14.8 g/dL (ref 13.0–17.0)
LYMPHS ABS: 3.2 10*3/uL (ref 0.7–4.0)
LYMPHS PCT: 27.2 % (ref 12.0–46.0)
MCHC: 33 g/dL (ref 30.0–36.0)
MCV: 85.1 fl (ref 78.0–100.0)
MONO ABS: 0.9 10*3/uL (ref 0.1–1.0)
Monocytes Relative: 7.3 % (ref 3.0–12.0)
Neutro Abs: 7.5 10*3/uL (ref 1.4–7.7)
Neutrophils Relative %: 63.4 % (ref 43.0–77.0)
PLATELETS: 227 10*3/uL (ref 150.0–400.0)
RBC: 5.28 Mil/uL (ref 4.22–5.81)
RDW: 14 % (ref 11.5–15.5)
WBC: 11.9 10*3/uL — ABNORMAL HIGH (ref 4.0–10.5)

## 2015-03-10 LAB — BASIC METABOLIC PANEL
BUN: 13 mg/dL (ref 6–23)
CO2: 31 mEq/L (ref 19–32)
Calcium: 9.9 mg/dL (ref 8.4–10.5)
Chloride: 103 mEq/L (ref 96–112)
Creatinine, Ser: 1.09 mg/dL (ref 0.40–1.50)
GFR: 97.2 mL/min (ref >60.00)
Glucose, Bld: 98 mg/dL (ref 70–99)
Potassium: 3.8 mEq/L (ref 3.5–5.1)
Sodium: 140 mEq/L (ref 135–145)

## 2015-03-10 LAB — HM DIABETES FOOT EXAM

## 2015-03-10 LAB — HEMOGLOBIN A1C: Hgb A1c MFr Bld: 5.5 % (ref 4.6–6.5)

## 2015-03-10 MED ORDER — KETOCONAZOLE 2 % EX CREA: 1.0000 "application " | TOPICAL_CREAM | Freq: Every day | CUTANEOUS | Status: DC

## 2015-03-10 MED ORDER — ATORVASTATIN CALCIUM 40 MG PO TABS: 40.0000 mg | ORAL_TABLET | Freq: Every day | ORAL | Status: DC

## 2015-03-10 MED ORDER — LOSARTAN POTASSIUM 100 MG PO TABS: 100.0000 mg | ORAL_TABLET | Freq: Every day | ORAL | Status: DC

## 2015-03-10 MED ORDER — CANAGLIFLOZIN 100 MG PO TABS: 100.0000 mg | ORAL_TABLET | Freq: Every day | ORAL | Status: DC

## 2015-03-10 MED ORDER — SITAGLIPTIN PHOS-METFORMIN HCL 50-1000 MG PO TABS: 1.0000 | ORAL_TABLET | Freq: Two times a day (BID) | ORAL | Status: DC

## 2015-03-10 MED ORDER — GLIMEPIRIDE 4 MG PO TABS: 4.0000 mg | ORAL_TABLET | Freq: Every day | ORAL | Status: DC

## 2015-03-10 NOTE — Patient Instructions (Addendum)
Get your blood work before you leave   Diabetes: Check your blood sugar  Twice a day  at different times of the day  GOALS: Fasting before a meal 70- 130 2 hours after a meal less than 180 At bedtime 90-150 Call if consistently not at goal    Two great books to learn about diabetes Diabetes for Dummies "The [facility] Diabetes diet" book   All about diabetes, great resource!  http://www.molina.com/.html  Apply the cream twice a day for one month

## 2015-03-10 NOTE — Progress Notes (Signed)
Subjective:    Patient ID: Liston Alba, male    DOB: [birth year], 39 y.o.   MRN: [id number]  DOS:  03/10/2015 Type of visit - description : Follow-up Interval history: Diabetes, pt spoke with a nutritionist over the phone but it was very expensive to schedule the visit. Is only a week ago that he started to change his diet on its own, trying to eat more salads, decrease portion sizes. Is trying to be more active. not checking his CBGs.  Also complaining of left foot pain for several months, he chronic feet deformities, unchanged for long time.   Review of Systems   Past Medical History  Diagnosis Date  . Obesity   . Diabetes 01/05/2013  . Tinea pedis, onychomycosis 01/05/2013  . Elevated LFTs 01/05/2013  . HTN (hypertension) 12/12/2012  . Hyperlipidemia 12/04/2013    Past Surgical History  Procedure Laterality Date  . No past surgeries      History   Social History  . Marital Status: Single    Spouse Name: N/A  . Number of Children: 5  . 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 Smoking status: Former Smoker -- 6 years    Types: Cigars    Quit date: 09/18/2000  . Smokeless tobacco: Never Used     Comment: smoke a few cigars off an on .   Marland Kitchen Alcohol Use: Yes     Comment: socially  . Drug Use: No  . Sexual Activity: Not on file   Other Topics Concern  . Not on file   Social History Narrative   Has a GF, live together .   Exercise: none   Diet: regular        Medication List       This list is accurate as of: 03/10/15 11:59 PM.  Always use your most recent med list.               atorvastatin 40 MG tablet  Commonly known as:  LIPITOR  Take 1 tablet (40 mg total) by mouth daily.     canagliflozin 100 MG Tabs tablet  Commonly known as:  INVOKANA  Take 1 tablet (100 mg total) by mouth daily.     glimepiride 4 MG tablet  Commonly known as:  AMARYL  Take 2 mg by  mouth daily with breakfast.     ketoconazole 2 % cream  Commonly known as:  NIZORAL  Apply 1 application topically daily.     losartan 100 MG tablet  Commonly known as:  COZAAR  Take 1 tablet (100 mg total) by mouth daily.     sitaGLIPtin-metformin 50-1000 MG per tablet  Commonly known as:  JANUMET  Take 1 tablet by mouth 2 (two) times daily with a meal.           Objective:   Physical Exam BP 134/84 mmHg  Pulse 85  Temp(Src) 97.6 F (36.4 C) (Oral)  Ht 6' (1.829 m)  Wt 317 lb 6 oz (143.96 kg)  BMI 43.03 kg/m2  SpO2 98% General:   Well developed, well nourished . NAD.  HEENT:  Normocephalic . Face symmetric, atraumatic Lungs:  CTA B Normal respiratory effort, no intercostal retractions, no accessory muscle use. Heart: RRR,  no murmur.  No pretibial edema bilaterally  Diabetic foot exam: Good pedal pulses, pinprick examination normal. Toenails are thick and dystrophic Skin is very scaly  in a moccasin distribution bilaterally, + maceration between the toes. His third and fourth toe on the left are fusion He has significant bunion deformities, some calluses on the left bunion. Neurologic:  alert & oriented X3.  Speech normal, gait appropriate for age and unassisted Psych--  Cognition and judgment appear intact.  Cooperative with normal attention span and concentration.  Behavior appropriate. No anxious or depressed appearing.      Assessment & Plan:

## 2015-03-10 NOTE — Assessment & Plan Note (Addendum)
Was unable to see a nutritionist, not checking CBGs. His only in the last week that he started to make some changes on its own. He is still very reluctant to take any injectables. Feet exam negative for neuropathy Plan:  Labs, check CBGs, endocrinology referral? Extensive discussion about diet, resources were provided, AVS. At the end of our compensation, he said  would consider a single injection of Victoza qd Addendum: A1c came back below 6, great results, I advised patient to decrease glimepiride to 2 mg daily.  Today , I spent more than 25   min with the patient: >50% of the time counseling regards diet

## 2015-03-10 NOTE — Progress Notes (Signed)
Pre visit review using our clinic review tool, if applicable. No additional management support is needed unless otherwise documented below in the visit note. 

## 2015-03-10 NOTE — Assessment & Plan Note (Addendum)
Treat tenia pedis today with Nizoral. Bunion deformities: Refer to ortho

## 2015-03-10 NOTE — Assessment & Plan Note (Signed)
Symptoms well-controlled, check a BMP, continue losartan

## 2015-06-16 ENCOUNTER — Ambulatory Visit: Payer: BLUE CROSS/BLUE SHIELD | Admitting: Internal Medicine

## 2015-06-23 ENCOUNTER — Ambulatory Visit: Payer: BLUE CROSS/BLUE SHIELD | Admitting: Internal Medicine

## 2015-07-04 ENCOUNTER — Telehealth: Payer: Self-pay | Admitting: Internal Medicine

## 2015-07-04 ENCOUNTER — Ambulatory Visit: Payer: BLUE CROSS/BLUE SHIELD | Admitting: Internal Medicine

## 2015-07-04 DIAGNOSIS — Z0289 Encounter for other administrative examinations: Secondary | ICD-10-CM

## 2015-07-06 NOTE — Telephone Encounter (Signed)
Pt was no show 07/04/15 8:45am for follow up, pt did not reschedule, charge or no charge?

## 2015-07-06 NOTE — Telephone Encounter (Signed)
Charge. 

## 2015-08-05 ENCOUNTER — Ambulatory Visit: Payer: BLUE CROSS/BLUE SHIELD | Admitting: Internal Medicine

## 2015-08-08 ENCOUNTER — Telehealth: Payer: Self-pay | Admitting: Internal Medicine

## 2015-08-08 NOTE — Telephone Encounter (Signed)
Pt was no show 08/05/15 11:15am for follow up appt, 2nd no show for same f/u, charge or no charge?

## 2015-08-08 NOTE — Telephone Encounter (Signed)
Charge. 

## 2015-11-01 ENCOUNTER — Other Ambulatory Visit: Payer: Self-pay | Admitting: Internal Medicine

## 2015-11-28 ENCOUNTER — Other Ambulatory Visit: Payer: Self-pay | Admitting: Internal Medicine

## 2015-12-14 ENCOUNTER — Other Ambulatory Visit: Payer: Self-pay | Admitting: Internal Medicine

## 2015-12-14 ENCOUNTER — Ambulatory Visit (INDEPENDENT_AMBULATORY_CARE_PROVIDER_SITE_OTHER): Payer: BLUE CROSS/BLUE SHIELD | Admitting: Internal Medicine

## 2015-12-14 ENCOUNTER — Encounter: Payer: Self-pay | Admitting: Internal Medicine

## 2015-12-14 VITALS — BP 140/96 | HR 94 | Temp 98.2°F | Resp 20 | Ht 72.0 in | Wt 315.5 lb

## 2015-12-14 DIAGNOSIS — E119 Type 2 diabetes mellitus without complications: Secondary | ICD-10-CM

## 2015-12-14 DIAGNOSIS — E785 Hyperlipidemia, unspecified: Secondary | ICD-10-CM

## 2015-12-14 DIAGNOSIS — I10 Essential (primary) hypertension: Secondary | ICD-10-CM | POA: Diagnosis not present

## 2015-12-14 DIAGNOSIS — Z09 Encounter for follow-up examination after completed treatment for conditions other than malignant neoplasm: Secondary | ICD-10-CM | POA: Insufficient documentation

## 2015-12-14 LAB — COMPREHENSIVE METABOLIC PANEL
ALT: 49 U/L (ref 0–53)
AST: 26 U/L (ref 0–37)
Albumin: 4.4 g/dL (ref 3.5–5.2)
Alkaline Phosphatase: 51 U/L (ref 39–117)
BILIRUBIN TOTAL: 0.6 mg/dL (ref 0.2–1.2)
BUN: 16 mg/dL (ref 6–23)
CALCIUM: 10.1 mg/dL (ref 8.4–10.5)
CHLORIDE: 102 meq/L (ref 96–112)
CO2: 31 meq/L (ref 19–32)
Creatinine, Ser: 1.12 mg/dL (ref 0.40–1.50)
GFR: 93.82 mL/min (ref >60.00)
GLUCOSE: 195 mg/dL — AB (ref 70–99)
Potassium: 4 mEq/L (ref 3.5–5.1)
Sodium: 140 mEq/L (ref 135–145)
Total Protein: 7.8 g/dL (ref 6.0–8.3)

## 2015-12-14 LAB — HEMOGLOBIN A1C: HEMOGLOBIN A1C: 6.3 % (ref 4.6–6.5)

## 2015-12-14 LAB — LIPID PANEL
CHOL/HDL RATIO: 4
Cholesterol: 175 mg/dL (ref 0–200)
HDL: 39.2 mg/dL (ref >39.00)
NONHDL: 135.68
TRIGLYCERIDES: 345 mg/dL — AB (ref 0.0–149.0)
VLDL: 69 mg/dL — AB (ref 0.0–40.0)

## 2015-12-14 LAB — LDL CHOLESTEROL, DIRECT: Direct LDL: 95 mg/dL

## 2015-12-14 MED ORDER — CANAGLIFLOZIN 100 MG PO TABS: 100.0000 mg | ORAL_TABLET | Freq: Every day | ORAL | Status: DC

## 2015-12-14 MED ORDER — SITAGLIPTIN PHOS-METFORMIN HCL 50-1000 MG PO TABS: 1.0000 | ORAL_TABLET | Freq: Two times a day (BID) | ORAL | Status: DC

## 2015-12-14 MED ORDER — GLIMEPIRIDE 4 MG PO TABS: 2.0000 mg | ORAL_TABLET | Freq: Every day | ORAL | Status: DC

## 2015-12-14 MED ORDER — LOSARTAN POTASSIUM 100 MG PO TABS: 100.0000 mg | ORAL_TABLET | Freq: Every day | ORAL | Status: DC

## 2015-12-14 MED ORDER — ATORVASTATIN CALCIUM 40 MG PO TABS: 40.0000 mg | ORAL_TABLET | Freq: Every day | ORAL | Status: DC

## 2015-12-14 NOTE — Progress Notes (Signed)
Subjective:    Patient ID: Michael Willis, male    DOB: 1975-10-14, 40 y.o.   MRN: [id number]  DOS:  12/14/2015 Type of visit - description : Routine visit Interval history: Diabetes: Good compliance with medications, reports blood sugars are "good" but could not tell me specific readings HTN: Good compliance would losartan, BP today is 140/96, no recent ambulatory BPs High cholesterol: on Lipitor. Due for labs. The patient is switching jobs, won't have insurance  for the next 3 months.   Review of Systems No chest pain or difficulty breathing No nausea, vomiting, diarrhea No GU rash Denies fatigue Vision  okay No lower extremity paresthesias.  Past Medical History  Diagnosis Date  . Obesity   . Diabetes (HCC) 01/05/2013  . Tinea pedis, onychomycosis 01/05/2013  . Elevated LFTs 01/05/2013  . HTN (hypertension) 12/12/2012  . Hyperlipidemia 12/04/2013    Past Surgical History  Procedure Laterality Date  . No past surgeries      Social History   Social History  . Marital Status: Single    Spouse Name: N/A  . Number of Children: 5  . 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 Smoking status: Former Smoker -- 6 years    Types: Cigars    Quit date: 09/18/2000  . Smokeless tobacco: Never Used     Comment: smoke a few cigars off an on .   Marland Kitchen. Alcohol Use: Yes     Comment: socially  . Drug Use: No  . Sexual Activity: Not on file   Other Topics Concern  . Not on file   Social History Narrative   Has a GF, live together .   Exercise: none   Diet: regular        Medication List       This list is accurate as of: 12/14/15  3:29 PM.  Always use your most recent med list.               atorvastatin 40 MG tablet  Commonly known as:  LIPITOR  Take 1 tablet (40 mg total) by mouth daily.     canagliflozin 100 MG Tabs tablet  Commonly known as:  INVOKANA  Take 1 tablet (100 mg  total) by mouth daily.     glimepiride 4 MG tablet  Commonly known as:  AMARYL  Take 0.5 tablets (2 mg total) by mouth daily with breakfast.     losartan 100 MG tablet  Commonly known as:  COZAAR  Take 1 tablet (100 mg total) by mouth daily.     sitaGLIPtin-metformin 50-1000 MG tablet  Commonly known as:  JANUMET  Take 1 tablet by mouth 2 (two) times daily with a meal.           Objective:   Physical Exam BP 140/96 mmHg  Pulse 94  Temp(Src) 98.2 F (36.8 C) (Oral)  Resp 20  Ht 6' (1.829 m)  Wt 315 lb 8 oz (143.11 kg)  BMI 42.78 kg/m2  SpO2 98% General:   Well developed, well nourished . NAD.  HEENT:  Normocephalic . Face symmetric, atraumatic Lungs:  CTA B Normal respiratory effort, no intercostal retractions, no accessory muscle use. Heart: RRR,  no murmur.  No pretibial edema bilaterally  Skin: Not pale. Not jaundice Neurologic:  alert & oriented X3.  Speech normal, gait appropriate for age and unassisted Psych--  Cognition and judgment appear  intact.  Cooperative with normal attention span and concentration.  Behavior appropriate. No anxious or depressed appearing.      Assessment & Plan:   Assessment DM 2014 HTN Hyperlipidemia Elevated LFTs Morbid obesity Onychomycosis  Plan: DM: Under excellent control last year, continue glimepiride, Invokana and Janumet. Check a CMP and A1c HTN: BP today is elevated, refill losartan, strongly recommend BP monitoring at home. Call if not at goal. High cholesterol: Continue Lipitor, check a FLP Compliance:  Patient to run out of insurance for 3 months, importance of continue taking medications discuss, may be a good candidate to use a cupon temporarily to continue being able to afford medications.  RTC 3 months

## 2015-12-14 NOTE — Progress Notes (Signed)
Pre visit review using our clinic review tool, if applicable. No additional management support is needed unless otherwise documented below in the visit note/SLS  

## 2015-12-14 NOTE — Telephone Encounter (Signed)
Rx request to pharmacy/SLS Requested drug refills are authorized 30-day supply Only, however, the patient needs further evaluation and/or laboratory testing before further refills are given. Ask him to make an appointment for this.

## 2015-12-14 NOTE — Assessment & Plan Note (Signed)
DM: Under excellent control last year, continue glimepiride, Invokana and Janumet. Check a CMP and A1c HTN: BP today is elevated, refill losartan, strongly recommend BP monitoring at home. Call if not at goal. High cholesterol: Continue Lipitor, check a FLP Compliance:  Patient to run out of insurance for 3 months, importance of continue taking medications discuss, may be a good candidate to use a cupon temporarily to continue being able to afford medications.  RTC 3 months

## 2015-12-14 NOTE — Patient Instructions (Signed)
GO TO THE LAB :      Get the blood work     GO TO THE FRONT DESK Schedule your next appointment for a  Routine check in 3 months, no fasting    Check the  blood pressure 2 or 3 times a week   Be sure your blood pressure is between 110/65 and  140/85. If it is consistently higher or lower, let me know   Please do not run out of medicines

## 2016-03-14 ENCOUNTER — Ambulatory Visit: Payer: BLUE CROSS/BLUE SHIELD | Admitting: Internal Medicine

## 2016-06-13 ENCOUNTER — Inpatient Hospital Stay (HOSPITAL_COMMUNITY): Payer: Self-pay

## 2016-06-13 ENCOUNTER — Inpatient Hospital Stay (HOSPITAL_COMMUNITY)
Admission: EM | Admit: 2016-06-13 | Discharge: 2016-06-16 | DRG: 638 | Disposition: A | Discharge status: Home / Self Care | Payer: Self-pay | Attending: Internal Medicine | Admitting: Internal Medicine

## 2016-06-13 ENCOUNTER — Encounter (HOSPITAL_COMMUNITY): Payer: Self-pay

## 2016-06-13 DIAGNOSIS — R7989 Other specified abnormal findings of blood chemistry: Secondary | ICD-10-CM

## 2016-06-13 DIAGNOSIS — Z9119 Patient's noncompliance with other medical treatment and regimen: Secondary | ICD-10-CM

## 2016-06-13 DIAGNOSIS — E1110 Type 2 diabetes mellitus with ketoacidosis without coma: Principal | ICD-10-CM | POA: Diagnosis present

## 2016-06-13 DIAGNOSIS — D72829 Elevated white blood cell count, unspecified: Secondary | ICD-10-CM | POA: Diagnosis present

## 2016-06-13 DIAGNOSIS — G4733 Obstructive sleep apnea (adult) (pediatric): Secondary | ICD-10-CM | POA: Diagnosis present

## 2016-06-13 DIAGNOSIS — N179 Acute kidney failure, unspecified: Secondary | ICD-10-CM | POA: Diagnosis present

## 2016-06-13 DIAGNOSIS — E785 Hyperlipidemia, unspecified: Secondary | ICD-10-CM | POA: Diagnosis present

## 2016-06-13 DIAGNOSIS — Z6838 Body mass index (BMI) 38.0-38.9, adult: Secondary | ICD-10-CM

## 2016-06-13 DIAGNOSIS — E875 Hyperkalemia: Secondary | ICD-10-CM

## 2016-06-13 DIAGNOSIS — Z91120 Patient's intentional underdosing of medication regimen due to financial hardship: Secondary | ICD-10-CM

## 2016-06-13 DIAGNOSIS — Z8249 Family history of ischemic heart disease and other diseases of the circulatory system: Secondary | ICD-10-CM

## 2016-06-13 DIAGNOSIS — Z7984 Long term (current) use of oral hypoglycemic drugs: Secondary | ICD-10-CM

## 2016-06-13 DIAGNOSIS — E1159 Type 2 diabetes mellitus with other circulatory complications: Secondary | ICD-10-CM | POA: Insufficient documentation

## 2016-06-13 DIAGNOSIS — R748 Abnormal levels of other serum enzymes: Secondary | ICD-10-CM

## 2016-06-13 DIAGNOSIS — E669 Obesity, unspecified: Secondary | ICD-10-CM | POA: Diagnosis present

## 2016-06-13 DIAGNOSIS — Z79899 Other long term (current) drug therapy: Secondary | ICD-10-CM

## 2016-06-13 DIAGNOSIS — T383X6A Underdosing of insulin and oral hypoglycemic [antidiabetic] drugs, initial encounter: Secondary | ICD-10-CM | POA: Diagnosis present

## 2016-06-13 DIAGNOSIS — Z87891 Personal history of nicotine dependence: Secondary | ICD-10-CM

## 2016-06-13 DIAGNOSIS — R778 Other specified abnormalities of plasma proteins: Secondary | ICD-10-CM | POA: Insufficient documentation

## 2016-06-13 DIAGNOSIS — I10 Essential (primary) hypertension: Secondary | ICD-10-CM | POA: Diagnosis present

## 2016-06-13 LAB — URINE MICROSCOPIC-ADD ON

## 2016-06-13 LAB — CBC
HCT: 46.1 % (ref 39.0–52.0)
Hemoglobin: 15.7 g/dL (ref 13.0–17.0)
MCH: 28.8 pg (ref 26.0–34.0)
MCHC: 34.1 g/dL (ref 30.0–36.0)
MCV: 84.6 fL (ref 78.0–100.0)
PLATELETS: 247 10*3/uL (ref 150–400)
RBC: 5.45 MIL/uL (ref 4.22–5.81)
RDW: 14.1 % (ref 11.5–15.5)
WBC: 18.4 10*3/uL — AB (ref 4.0–10.5)

## 2016-06-13 LAB — URINALYSIS, ROUTINE W REFLEX MICROSCOPIC
Bilirubin Urine: NEGATIVE
KETONES UR: 40 mg/dL — AB
LEUKOCYTES UA: NEGATIVE
NITRITE: NEGATIVE
PROTEIN: NEGATIVE mg/dL
Specific Gravity, Urine: 1.025 (ref 1.005–1.030)
pH: 5.5 (ref 5.0–8.0)

## 2016-06-13 LAB — BASIC METABOLIC PANEL
Anion gap: 25 — ABNORMAL HIGH (ref 5–15)
Anion gap: 23 — ABNORMAL HIGH (ref 5–15)
Anion gap: 14 (ref 5–15)
BUN: 16 mg/dL (ref 6–20)
BUN: 20 mg/dL (ref 6–20)
BUN: 25 mg/dL — AB (ref 6–20)
CALCIUM: 10.1 mg/dL (ref 8.9–10.3)
CALCIUM: 9.9 mg/dL (ref 8.9–10.3)
CHLORIDE: 91 mmol/L — AB (ref 101–111)
CO2: 13 mmol/L — ABNORMAL LOW (ref 22–32)
CO2: 12 mmol/L — ABNORMAL LOW (ref 22–32)
CO2: 18 mmol/L — ABNORMAL LOW (ref 22–32)
CREATININE: 1.86 mg/dL — AB (ref 0.61–1.24)
CREATININE: 1.84 mg/dL — AB (ref 0.61–1.24)
CREATININE: 2.26 mg/dL — AB (ref 0.61–1.24)
Calcium: 10 mg/dL (ref 8.9–10.3)
Chloride: 97 mmol/L — ABNORMAL LOW (ref 101–111)
Chloride: 103 mmol/L (ref 101–111)
GFR calc Af Amer: 52 mL/min — ABNORMAL LOW (ref >60)
GFR calc Af Amer: 40 mL/min — ABNORMAL LOW (ref >60)
GFR, EST AFRICAN AMERICAN: 51 mL/min — AB (ref >60)
GFR, EST NON AFRICAN AMERICAN: 44 mL/min — AB (ref >60)
GFR, EST NON AFRICAN AMERICAN: 45 mL/min — AB (ref >60)
GFR, EST NON AFRICAN AMERICAN: 35 mL/min — AB (ref >60)
Glucose, Bld: 761 mg/dL (ref 65–99)
Glucose, Bld: 662 mg/dL (ref 65–99)
Glucose, Bld: 354 mg/dL — ABNORMAL HIGH (ref 65–99)
Potassium: 5.9 mmol/L — ABNORMAL HIGH (ref 3.5–5.1)
Potassium: 6.4 mmol/L (ref 3.5–5.1)
Potassium: 4.7 mmol/L (ref 3.5–5.1)
SODIUM: 129 mmol/L — AB (ref 135–145)
SODIUM: 132 mmol/L — AB (ref 135–145)
SODIUM: 135 mmol/L (ref 135–145)

## 2016-06-13 LAB — CBG MONITORING, ED
GLUCOSE-CAPILLARY: 581 mg/dL — AB (ref 65–99)
GLUCOSE-CAPILLARY: 512 mg/dL — AB (ref 65–99)
GLUCOSE-CAPILLARY: 482 mg/dL — AB (ref 65–99)
GLUCOSE-CAPILLARY: 478 mg/dL — AB (ref 65–99)
GLUCOSE-CAPILLARY: 466 mg/dL — AB (ref 65–99)
GLUCOSE-CAPILLARY: 383 mg/dL — AB (ref 65–99)
GLUCOSE-CAPILLARY: 296 mg/dL — AB (ref 65–99)
Glucose-Capillary: 263 mg/dL — ABNORMAL HIGH (ref 65–99)
Glucose-Capillary: >600 mg/dL (ref 65–99)

## 2016-06-13 LAB — LACTIC ACID, PLASMA
Lactic Acid, Venous: 3.9 mmol/L (ref 0.5–1.9)
Lactic Acid, Venous: 3.6 mmol/L (ref 0.5–1.9)

## 2016-06-13 LAB — TROPONIN I
TROPONIN I: 0.03 ng/mL — AB (ref <0.03)
Troponin I: 0.16 ng/mL (ref <0.03)

## 2016-06-13 MED ORDER — DEXTROSE 50 % IV SOLN: 25.0000 mL | INTRAVENOUS | Status: DC | PRN

## 2016-06-13 MED ORDER — DEXTROSE-NACL 5-0.45 % IV SOLN
INTRAVENOUS | Status: DC
  Administered 2016-06-14 (×2): via INTRAVENOUS

## 2016-06-13 MED ORDER — SODIUM CHLORIDE 0.9 % IV SOLN
INTRAVENOUS | Status: DC
  Administered 2016-06-13: 5.4 [IU]/h via INTRAVENOUS
  Filled 2016-06-13: qty 2.5

## 2016-06-13 MED ORDER — SODIUM CHLORIDE 0.9 % IV BOLUS (SEPSIS): 1000.0000 mL | Freq: Once | INTRAVENOUS | Status: DC

## 2016-06-13 MED ORDER — SODIUM CHLORIDE 0.9 % IV SOLN
INTRAVENOUS | Status: DC
  Filled 2016-06-13: qty 2.5

## 2016-06-13 MED ORDER — HEPARIN SODIUM (PORCINE) 5000 UNIT/ML IJ SOLN
5000.0000 [IU] | Freq: Three times a day (TID) | INTRAMUSCULAR | Status: DC
  Administered 2016-06-13 – 2016-06-16 (×9): 5000 [IU] via SUBCUTANEOUS
  Filled 2016-06-13 (×9): qty 1

## 2016-06-13 MED ORDER — SODIUM CHLORIDE 0.9 % IV SOLN
INTRAVENOUS | Status: DC
  Administered 2016-06-13: 100 mL/h via INTRAVENOUS

## 2016-06-13 MED ORDER — SODIUM CHLORIDE 0.9 % IV SOLN: INTRAVENOUS | Status: DC

## 2016-06-13 MED ORDER — SODIUM CHLORIDE 0.9 % IV BOLUS (SEPSIS)
2000.0000 mL | Freq: Once | INTRAVENOUS | Status: AC
  Administered 2016-06-14: 2000 mL via INTRAVENOUS

## 2016-06-13 MED ORDER — HYDRALAZINE HCL 20 MG/ML IJ SOLN: 5.0000 mg | Freq: Three times a day (TID) | INTRAMUSCULAR | Status: DC | PRN

## 2016-06-13 MED ORDER — SODIUM CHLORIDE 0.9 % IV BOLUS (SEPSIS)
1000.0000 mL | Freq: Once | INTRAVENOUS | Status: AC
  Administered 2016-06-13: 1000 mL via INTRAVENOUS

## 2016-06-13 MED ORDER — ATORVASTATIN CALCIUM 40 MG PO TABS
40.0000 mg | ORAL_TABLET | Freq: Every day | ORAL | Status: DC
  Administered 2016-06-13 – 2016-06-16 (×4): 40 mg via ORAL
  Filled 2016-06-13 (×4): qty 1

## 2016-06-13 MED ORDER — LOSARTAN POTASSIUM 50 MG PO TABS
100.0000 mg | ORAL_TABLET | Freq: Every day | ORAL | Status: DC
  Administered 2016-06-13: 100 mg via ORAL
  Filled 2016-06-13: qty 2

## 2016-06-13 MED ORDER — INSULIN REGULAR BOLUS VIA INFUSION
0.0000 [IU] | Freq: Three times a day (TID) | INTRAVENOUS | Status: DC
  Filled 2016-06-13: qty 10

## 2016-06-13 MED ORDER — DEXTROSE-NACL 5-0.45 % IV SOLN: INTRAVENOUS | Status: DC

## 2016-06-13 NOTE — H&P (Signed)
History and Physical    Tavonte A Bellanca WUJ:[id number] DOB: 1976/06/25 DOA: 06/13/2016   PCP: Willow OraJose Paz, MD   Patient coming from:Michael Willis  Home    Chief Complaint:  Abdominal pain, Polydipsia and Polyuria   HPI: Michael AlbaDeven A Seckel is a 40 y.o. male with medical history significant for DM poorly controlled  Diagnosed about 2 years ago, non compliant with his oral meds, not taking them for about 2 months due to lack of insurance since 02/2016 , inability to pay, presenting with 2 to 3 day history ofdiffuse abdominal pain, severe polydipsia and polyuria, generalized malaise, and when episodes diarrhea without nausea or vomiting. He denies any fever or chills, or constipation. He denies any shortness of breath or chest pain. He did palpitations early in the morning, no blurred vision, or headaches. No syncope orpresyncope. No Seizure Activity. No Confusion. He is unawhare of any other stressors than his job loss. No recent infections. NO recent travels.    ED Course:  BP (!) 145/103   Pulse (!) 124   Temp 98.1 F (36.7 C) (Oral)   Resp 20   Ht 5\' 10"  (1.778 m)   Wt 117.7 kg (259 lb 6 oz)   SpO2 98%   BMI 37.22 kg/m   Sodium 129 potassium 5.9 creatinine 1.86 bicarb 13 glucose 761 annion gap 25 white count 18.4 EKG ST without ACS   Review of Systems: As per HPI otherwise 10 point review of systems negative.   Past Medical History:  Diagnosis Date  . Diabetes (HCC) 01/05/2013  . Elevated LFTs 01/05/2013  . HTN (hypertension) 12/12/2012  . Hyperlipidemia 12/04/2013  . Obesity   . Tinea pedis, onychomycosis 01/05/2013    Past Surgical History:  Procedure Laterality Date  . NO PAST SURGERIES      Social History Social History   Social History  . Marital status: Single    Spouse name: N/A  . Number of children: 5  . 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 Smoking status: Former  Smoker    Years: 6.00    Types: Cigars    Quit date: 09/18/2000  . Smokeless tobacco: Never Used     Comment: smoke a few cigars off an on .   Marland Kitchen. Alcohol use Yes     Comment: socially  . Drug use: No  . Sexual activity: Not on file   Other Topics Concern  . Not on file   Social History Narrative   Has a GF, live together .   Exercise: none   Diet: regular     No Known Allergies  Family History  Problem Relation Age of Onset  . CAD Mother 2455    MI?  . Diabetes Neg Hx   . Stroke Neg Hx   . Colon cancer Neg Hx   . Prostate cancer Neg Hx       Prior to Admission medications   Medication Sig Start Date End Date Taking? Authorizing Provider  atorvastatin (LIPITOR) 40 MG tablet Take 1 tablet (40 mg total) by mouth daily. 12/14/15   Wanda PlumpJose E Paz, MD  canagliflozin (INVOKANA) 100 MG TABS tablet Take 1 tablet (100 mg total) by mouth daily. 12/14/15   Wanda PlumpJose E Paz, MD  glimepiride (AMARYL) 4 MG tablet Take 0.5 tablets (2 mg total) by mouth daily with breakfast. 12/14/15   Wanda PlumpJose E Paz, MD  losartan (  COZAAR) 100 MG tablet Take 1 tablet (100 mg total) by mouth daily. 12/14/15   Wanda Plump, MD  sitaGLIPtin-metformin (JANUMET) 50-1000 MG tablet Take 1 tablet by mouth 2 (two) times daily with a meal. 12/14/15   Wanda Plump, MD    Physical Exam:    Vitals:   06/13/16 1210 06/13/16 1226 06/13/16 1300 06/13/16 1440  BP: 130/84 (!) 142/106 (!) 145/103   Pulse: (!) 133 (!) 127 (!) 124   Resp: 18 20 20    Temp: 98.1 F (36.7 C)     TempSrc: Oral     SpO2: 100% 97% 98%   Weight:    117.7 kg (259 lb 6 oz)  Height:    5\' 10"  (1.778 m)       Constitutional: NAD, calm,somewhat anxious  Vitals:   06/13/16 1210 06/13/16 1226 06/13/16 1300 06/13/16 1440  BP: 130/84 (!) 142/106 (!) 145/103   Pulse: (!) 133 (!) 127 (!) 124   Resp: 18 20 20    Temp: 98.1 F (36.7 C)     TempSrc: Oral     SpO2: 100% 97% 98%   Weight:    117.7 kg (259 lb 6 oz)  Height:    5\' 10"  (1.778 m)   Eyes: PERRL, lids  and conjunctivae normal ENMT: Mucous membranes are moist. Posterior pharynx clear of any exudate or lesions.Normal dentition.  Neck: normal, supple, no masses, no thyromegaly Respiratory: clear to auscultation bilaterally, no wheezing, no crackles. Normal respiratory effort. No accessory muscle use.  Cardiovascular: Regular rate and rhythm, no murmurs / rubs / gallops. No extremity edema. 2+ pedal pulses. No carotid bruits.  Abdomen:  Obese, no tenderness, no masses palpated. No hepatosplenomegaly. Bowel sounds positive.  Musculoskeletal: no clubbing / cyanosis. No joint deformity upper and lower extremities. Good ROM, no contractures. Normal muscle tone.  Skin: no rashes, lesions, ulcers. Multiple tattoos   Neurologic: CN 2-12 grossly intact. Sensation intact, DTR normal. Strength 5/5 in all 4.      Labs on Admission: I have personally reviewed following labs and imaging studies  CBC:  Recent Labs Lab 06/13/16 1214  WBC 18.4*  HGB 15.7  HCT 46.1  MCV 84.6  PLT 247    Basic Metabolic Panel:  Recent Labs Lab 06/13/16 1214  NA 129*  K 5.9*  CL 91*  CO2 13*  GLUCOSE 761*  BUN 16  CREATININE 1.86*  CALCIUM 10.1    GFR: Estimated Creatinine Clearance: 68.6 mL/min (by C-G formula based on SCr of 1.86 mg/dL (H)).  Liver Function Tests: No results for input(s): AST, ALT, ALKPHOS, BILITOT, PROT, ALBUMIN in the last 168 hours. No results for input(s): LIPASE, AMYLASE in the last 168 hours. No results for input(s): AMMONIA in the last 168 hours.  Coagulation Profile: No results for input(s): INR, PROTIME in the last 168 hours.  Cardiac Enzymes: No results for input(s): CKTOTAL, CKMB, CKMBINDEX, TROPONINI in the last 168 hours.  BNP (last 3 results) No results for input(s): PROBNP in the last 8760 hours.  HbA1C: No results for input(s): HGBA1C in the last 72 hours.  CBG:  Recent Labs Lab 06/13/16 1213  GLUCAP >600*    Lipid Profile: No results for input(s):  CHOL, HDL, LDLCALC, TRIG, CHOLHDL, LDLDIRECT in the last 72 hours.  Thyroid Function Tests: No results for input(s): TSH, T4TOTAL, FREET4, T3FREE, THYROIDAB in the last 72 hours.  Anemia Panel: No results for input(s): VITAMINB12, FOLATE, FERRITIN, TIBC, IRON, RETICCTPCT in the last 72 hours.  Urine analysis:    Component Value Date/Time   COLORURINE YELLOW 06/13/2016 1220   APPEARANCEUR CLEAR 06/13/2016 1220   LABSPEC 1.025 06/13/2016 1220   PHURINE 5.5 06/13/2016 1220   GLUCOSEU >1000 (A) 06/13/2016 1220   HGBUR TRACE (A) 06/13/2016 1220   BILIRUBINUR NEGATIVE 06/13/2016 1220   KETONESUR 40 (A) 06/13/2016 1220   PROTEINUR NEGATIVE 06/13/2016 1220   NITRITE NEGATIVE 06/13/2016 1220   LEUKOCYTESUR NEGATIVE 06/13/2016 1220    Sepsis Labs: @LABRCNTIP (procalcitonin:4,lacticidven:4) )No results found for this or any previous visit (from the past 240 hour(s)).   Radiological Exams on Admission: No results found.  EKG: Independently reviewed.  Assessment/Plan Active Problems:   DKA (diabetic ketoacidoses) (HCC)   OSA (obstructive sleep apnea)   HTN (hypertension)   Diabetes (HCC)   Hyperlipidemia   Diabetic Ketoacidosis   Admission Glucose was 761,Anion Gap 25  Bicarb  13   Received  l IV NS,  Insulin drip initiated . K 5.9  Admit for SDU DKA order set  Hold oral hypoglycemics UA with cultures Serial lactic acid  NPO for now   Hypertension BP (!) 145/103   Pulse (!) 124   Temp 98.1 F (36.7 C) (Oral)   Resp 20   Ht 5\' 10"  (1.778 m)   Wt 117.7 kg (259 lb 6 oz)   SpO2 98%   BMI 37.22 kg/m    Resume Cozaar in am  Add Hydralazine Q6 hours as needed for BP 160/90   Hyperlipidemia Resume Lipitor   Leukocytosis rule out underlying infection. WBC 18.4  Afebrile    Blood and urine culture  CXR    IVF   Repeat CBC in AM  Acute Kidney Injury likely due to dehydration   Not confused. Cr 1.86  Lab Results  Component Value Date   CREATININE 1.86 (H) 06/13/2016    CREATININE 1.12 12/14/2015   CREATININE 1.09 03/10/2015  IVF CMET in am  I/O and daily weights  Check blood cultures, urine culture  DVT prophylaxis:  Lovenox  Code Status:   Full     Family Communication:  Discussed with patient   Disposition Plan: Expect patient to be discharged to home after condition improves Consults called:    None Admission status:  SDU    [facility] E, PA-C [facility]   If 7PM-7AM, please contact night-coverage [email or web address] Password [email or web address]  06/13/2016, 2:56 PM

## 2016-06-13 NOTE — ED Notes (Signed)
Patient stated lost a lot of weight and actually weighed patient.

## 2016-06-13 NOTE — ED Notes (Signed)
Placed patient into a gown and on the monitor 

## 2016-06-13 NOTE — ED Notes (Signed)
Admit providers Dr Konrad DoloresMerrell and Huntley DecSara notified of lactic acid 3.9.

## 2016-06-13 NOTE — ED Notes (Signed)
Checked patient blood sugar it was 581 notified RN Greg of blood sugar

## 2016-06-13 NOTE — ED Notes (Signed)
Critical value results reported to provider Potassium 6.4, troponin 0.03 and glucose 662.

## 2016-06-13 NOTE — ED Provider Notes (Signed)
[facility]-EMER[facility]ENCY DEPT Provider Note   CSN: [id number] Arrival date & time: 06/13/16  1202     History   Chief Complaint Chief Complaint  Patient presents with  . Abdominal Pain  . Palpitations    HPI Michael Willis is a 40 y.o. male.  Mr. Michael Willis is a 40 yo gentlemen with PMH T2DM, obesity, HTN, HLD who presents for 2-3 days of abdominal pain. He reports that he has been out of his diabetes medications for about two months as he is unable to afford them without insurance. He lost his job and insurance in July 2017. He reports decreased appetite, polyuria, polydipsia for several days to weeks. He began experiencing palpitations today. He also reports eating [employer] a few days ago and noting generalized abdominal pain and malaise since then, had 1 episode of diarrhea but no nausea/vomiting, fevers/chills, constipation, blurry vision, shortness of breath, or chest pain.    The history is provided by the patient. No language interpreter was used.    Past Medical History:  Diagnosis Date  . Diabetes (HCC) 01/05/2013  . Elevated LFTs 01/05/2013  . HTN (hypertension) 12/12/2012  . Hyperlipidemia 12/04/2013  . Obesity   . Tinea pedis, onychomycosis 01/05/2013    Patient Active Problem List   Diagnosis Date Noted  . DKA (diabetic ketoacidoses) (HCC) 06/13/2016  . PCP NOTES >>>>>>>>>>>>>>>>>>>>.Marland Kitchen. 12/14/2015  . Hyperlipidemia 12/04/2013  . Diabetes (HCC) 01/05/2013  . Tinea pedis, onychomycosis, feet deformity 01/05/2013  . Elevated LFTs 01/05/2013  . Annual physical exam 12/12/2012  . OSA (obstructive sleep apnea) 12/12/2012  . HTN (hypertension) 12/12/2012    Past Surgical History:  Procedure Laterality Date  . NO PAST SURGERIES         Home Medications    Prior to Admission medications   Not on File    Family History Family History  Problem Relation Age of Onset  . CAD Mother 255    MI?  . Diabetes Neg Hx   . Stroke Neg Hx   . Colon cancer Neg Hx   . Prostate  cancer Neg Hx     Social History Social History  Substance Use Topics  . Smoking status: Former Smoker    Years: 6.00    Types: Cigars    Quit date: 09/18/2000  . Smokeless tobacco: Never Used     Comment: smoke a few cigars off an on .   Marland Kitchen. Alcohol use Yes     Comment: socially     Allergies   Review of patient's allergies indicates no known allergies.   Review of Systems Review of Systems  Constitutional: Positive for appetite change, fatigue and unexpected weight change (wt loss). Negative for chills, diaphoresis and fever.  Eyes: Negative for visual disturbance.  Respiratory: Negative for cough and shortness of breath.   Cardiovascular: Negative for chest pain and palpitations.  Gastrointestinal: Positive for abdominal pain and diarrhea. Negative for constipation, nausea and vomiting.  Endocrine: Positive for polydipsia and polyuria.  Genitourinary: Positive for frequency. Negative for difficulty urinating, dysuria and flank pain.  All other systems reviewed and are negative.    Physical Exam Updated Vital Signs BP (!) 134/105   Pulse (!) 125   Temp 98.1 F (36.7 C) (Oral)   Resp 20   Ht 5\' 10"  (1.778 m)   Wt 117.7 kg   SpO2 96%   BMI 37.22 kg/m   Physical Exam  Constitutional: He is oriented to person, place, and time. He appears well-developed and well-nourished.  No distress.  Obese  HENT:  Head: Normocephalic and atraumatic.  Mildly dry MM  Eyes: EOM are normal. Pupils are equal, round, and reactive to light.  Neck: Normal range of motion. Neck supple.  Cardiovascular: Regular rhythm, normal heart sounds and intact distal pulses.  Exam reveals no gallop and no friction rub.   No murmur heard. Tachycardic rate  Pulmonary/Chest: Effort normal and breath sounds normal. No respiratory distress. He has no wheezes. He has no rales.  Abdominal: Soft. Bowel sounds are normal. He exhibits no distension and no mass. There is tenderness (LUQ). There is no rebound  and no guarding.  obese  Musculoskeletal: Normal range of motion. He exhibits no edema, tenderness or deformity.  Neurological: He is alert and oriented to person, place, and time.  Skin: Skin is warm and dry. Capillary refill takes less than 2 seconds. He is not diaphoretic. No erythema.  Psychiatric: He has a normal mood and affect. His behavior is normal. Thought content normal.     ED Treatments / Results  Labs (all labs ordered are listed, but only abnormal results are displayed) Labs Reviewed  BASIC METABOLIC PANEL - Abnormal; Notable for the following:       Result Value   Sodium 129 (*)    Potassium 5.9 (*)    Chloride 91 (*)    CO2 13 (*)    Glucose, Bld 761 (*)    Creatinine, Ser 1.86 (*)    GFR calc non Af Amer 44 (*)    GFR calc Af Amer 51 (*)    Anion gap 25 (*)    All other components within normal limits  CBC - Abnormal; Notable for the following:    WBC 18.4 (*)    All other components within normal limits  URINALYSIS, ROUTINE W REFLEX MICROSCOPIC (NOT AT [facility]) - Abnormal; Notable for the following:    Glucose, UA >1000 (*)    Hgb urine dipstick TRACE (*)    Ketones, ur 40 (*)    All other components within normal limits  URINE MICROSCOPIC-ADD ON - Abnormal; Notable for the following:    Squamous Epithelial / LPF 0-5 (*)    Bacteria, UA RARE (*)    All other components within normal limits  CBG MONITORING, ED - Abnormal; Notable for the following:    Glucose-Capillary >600 (*)    All other components within normal limits  CBG MONITORING, ED - Abnormal; Notable for the following:    Glucose-Capillary 581 (*)    All other components within normal limits  CBG MONITORING, ED - Abnormal; Notable for the following:    Glucose-Capillary >600 (*)    All other components within normal limits  BASIC METABOLIC PANEL  BASIC METABOLIC PANEL  BASIC METABOLIC PANEL  BASIC METABOLIC PANEL  TROPONIN I  TROPONIN I  TROPONIN I  LACTIC ACID, PLASMA  LACTIC ACID,  PLASMA    EKG  EKG Interpretation  Date/Time:  Wednesday June 13 2016 12:06:52 EDT Ventricular Rate:  130 PR Interval:  142 QRS Duration: 82 QT Interval:  284 QTC Calculation: 417 R Axis:   51 Text Interpretation:  Sinus tachycardia Nonspecific T wave abnormality No previous tracing Confirmed by Denton Lank  MD, Caryn Bee ([id number]) on 06/13/2016 12:56:00 PM       Radiology Portable Chest X-ray (1 View)  Result Date: 06/13/2016 CLINICAL DATA:  Diabetic ketoacidosis EXAM: PORTABLE CHEST 1 VIEW COMPARISON:  None. FINDINGS: The heart size and mediastinal contours are within normal limits. Both  lungs are clear. The visualized skeletal structures are unremarkable. IMPRESSION: No active disease. Electronically Signed   By: Tollie Eth M.D.   On: 06/13/2016 14:55    Procedures Procedures (including critical care time)  Medications Ordered in ED Medications  atorvastatin (LIPITOR) tablet 40 mg (not administered)  losartan (COZAAR) tablet 100 mg (not administered)  0.9 %  sodium chloride infusion (100 mL/hr Intravenous New Bag/Given 06/13/16 1440)  dextrose 5 %-0.45 % sodium chloride infusion (not administered)  insulin regular (NOVOLIN R,HUMULIN R) 250 Units in sodium chloride 0.9 % 250 mL (1 Units/mL) infusion (5.2 Units/hr Intravenous Rate/Dose Change 06/13/16 1543)  heparin injection 5,000 Units (not administered)  hydrALAZINE (APRESOLINE) injection 5-10 mg (not administered)  sodium chloride 0.9 % bolus 1,000 mL (0 mLs Intravenous Stopped 06/13/16 1435)     Initial Impression / Assessment and Plan / ED Course  I have reviewed the triage vital signs and the nursing notes.  Pertinent labs & imaging results that were available during my care of the patient were reviewed by me and considered in my medical decision making (see chart for details).  Clinical Course   Mr. Royster is a 40yo gentleman with PMH T2DM, obesity, HLD, HTN presenting with generalized abdominal pain and  palpitations, reporting several months uncontrolled T2DM not taking medications, found to have glucose 761, anion gap 25, ketonuria, consistent with DKA. Also with mild AKI, Cr 1.86. Will give IVF and start insulin gtt, admitting to hospitalist service.   Final Clinical Impressions(s) / ED Diagnoses   Final diagnoses:  Diabetic ketoacidosis without coma associated with type 2 diabetes mellitus [facility])    New Prescriptions New Prescriptions   No medications on file     Althia Forts, MD 06/13/16 1556    Cathren Laine, MD 06/14/16 [phone]

## 2016-06-13 NOTE — ED Notes (Signed)
Glucostablizer to reflect DKA order set

## 2016-06-13 NOTE — ED Notes (Signed)
Checked patient blood sugar it was 512

## 2016-06-13 NOTE — ED Notes (Signed)
PAGED ADMITTING DR TO RN KAITLYN--LESLIE

## 2016-06-13 NOTE — ED Notes (Signed)
Checked patient blood sugar it read greater then 600 notified RN Greg of blood sugar

## 2016-06-14 DIAGNOSIS — E785 Hyperlipidemia, unspecified: Secondary | ICD-10-CM

## 2016-06-14 DIAGNOSIS — I10 Essential (primary) hypertension: Secondary | ICD-10-CM

## 2016-06-14 DIAGNOSIS — E1310 Other specified diabetes mellitus with ketoacidosis without coma: Secondary | ICD-10-CM

## 2016-06-14 LAB — I-STAT ARTERIAL BLOOD GAS, ED
Acid-base deficit: 4 mmol/L — ABNORMAL HIGH (ref 0.0–2.0)
BICARBONATE: 20.4 mmol/L (ref 20.0–28.0)
O2 SAT: 94 %
TCO2: 21 mmol/L (ref 0–100)
pCO2 arterial: 32.1 mmHg (ref 32.0–48.0)
pH, Arterial: 7.408 (ref 7.350–7.450)
pO2, Arterial: 67 mmHg — ABNORMAL LOW (ref 83.0–108.0)

## 2016-06-14 LAB — BASIC METABOLIC PANEL
ANION GAP: 10 (ref 5–15)
Anion gap: 9 (ref 5–15)
BUN: 28 mg/dL — ABNORMAL HIGH (ref 6–20)
BUN: 29 mg/dL — ABNORMAL HIGH (ref 6–20)
CALCIUM: 9 mg/dL (ref 8.9–10.3)
CALCIUM: 9.3 mg/dL (ref 8.9–10.3)
CHLORIDE: 105 mmol/L (ref 101–111)
CO2: 22 mmol/L (ref 22–32)
CO2: 21 mmol/L — AB (ref 22–32)
CREATININE: 2.42 mg/dL — AB (ref 0.61–1.24)
Chloride: 106 mmol/L (ref 101–111)
Creatinine, Ser: 1.93 mg/dL — ABNORMAL HIGH (ref 0.61–1.24)
GFR calc Af Amer: 37 mL/min — ABNORMAL LOW (ref >60)
GFR calc non Af Amer: 32 mL/min — ABNORMAL LOW (ref >60)
GFR calc non Af Amer: 42 mL/min — ABNORMAL LOW (ref >60)
GFR, EST AFRICAN AMERICAN: 49 mL/min — AB (ref >60)
GLUCOSE: 211 mg/dL — AB (ref 65–99)
GLUCOSE: 140 mg/dL — AB (ref 65–99)
POTASSIUM: 4.4 mmol/L (ref 3.5–5.1)
Potassium: 4.4 mmol/L (ref 3.5–5.1)
Sodium: 137 mmol/L (ref 135–145)
Sodium: 136 mmol/L (ref 135–145)

## 2016-06-14 LAB — GLUCOSE, CAPILLARY
GLUCOSE-CAPILLARY: 150 mg/dL — AB (ref 65–99)
GLUCOSE-CAPILLARY: 148 mg/dL — AB (ref 65–99)
GLUCOSE-CAPILLARY: 118 mg/dL — AB (ref 65–99)
GLUCOSE-CAPILLARY: 243 mg/dL — AB (ref 65–99)
GLUCOSE-CAPILLARY: 311 mg/dL — AB (ref 65–99)
Glucose-Capillary: 143 mg/dL — ABNORMAL HIGH (ref 65–99)
Glucose-Capillary: 180 mg/dL — ABNORMAL HIGH (ref 65–99)
Glucose-Capillary: 193 mg/dL — ABNORMAL HIGH (ref 65–99)
Glucose-Capillary: 196 mg/dL — ABNORMAL HIGH (ref 65–99)
Glucose-Capillary: 238 mg/dL — ABNORMAL HIGH (ref 65–99)
Glucose-Capillary: 240 mg/dL — ABNORMAL HIGH (ref 65–99)

## 2016-06-14 LAB — TROPONIN I
TROPONIN I: 0.03 ng/mL — AB (ref <0.03)
Troponin I: 0.08 ng/mL (ref <0.03)
Troponin I: 0.1 ng/mL (ref <0.03)
Troponin I: 0.04 ng/mL (ref <0.03)

## 2016-06-14 LAB — MRSA PCR SCREENING: MRSA BY PCR: NEGATIVE

## 2016-06-14 MED ORDER — INSULIN ASPART PROT & ASPART (70-30 MIX) 100 UNIT/ML ~~LOC~~ SUSP
30.0000 [IU] | Freq: Two times a day (BID) | SUBCUTANEOUS | Status: DC
  Administered 2016-06-14 – 2016-06-15 (×2): 30 [IU] via SUBCUTANEOUS
  Filled 2016-06-14: qty 10

## 2016-06-14 MED ORDER — LIVING WELL WITH DIABETES BOOK
Freq: Once | Status: AC
  Administered 2016-06-14: 12:00:00
  Filled 2016-06-14: qty 1

## 2016-06-14 MED ORDER — SODIUM CHLORIDE 0.9 % IV BOLUS (SEPSIS)
1000.0000 mL | Freq: Once | INTRAVENOUS | Status: AC
  Administered 2016-06-14: 1000 mL via INTRAVENOUS

## 2016-06-14 MED ORDER — INSULIN ASPART 100 UNIT/ML ~~LOC~~ SOLN
0.0000 [IU] | Freq: Three times a day (TID) | SUBCUTANEOUS | Status: DC
  Administered 2016-06-14: 3 [IU] via SUBCUTANEOUS

## 2016-06-14 MED ORDER — INSULIN GLARGINE 100 UNIT/ML ~~LOC~~ SOLN
20.0000 [IU] | Freq: Every day | SUBCUTANEOUS | Status: DC
  Administered 2016-06-14: 20 [IU] via SUBCUTANEOUS
  Filled 2016-06-14: qty 0.2

## 2016-06-14 MED ORDER — INSULIN ASPART 100 UNIT/ML ~~LOC~~ SOLN: 0.0000 [IU] | Freq: Every day | SUBCUTANEOUS | Status: DC

## 2016-06-14 MED ORDER — INSULIN ASPART 100 UNIT/ML ~~LOC~~ SOLN
0.0000 [IU] | Freq: Three times a day (TID) | SUBCUTANEOUS | Status: DC
  Administered 2016-06-14 – 2016-06-15 (×2): 11 [IU] via SUBCUTANEOUS
  Administered 2016-06-15: 7 [IU] via SUBCUTANEOUS
  Administered 2016-06-15 – 2016-06-16 (×3): 11 [IU] via SUBCUTANEOUS

## 2016-06-14 MED ORDER — INSULIN ASPART 100 UNIT/ML ~~LOC~~ SOLN
0.0000 [IU] | Freq: Every day | SUBCUTANEOUS | Status: DC
  Administered 2016-06-14: 2 [IU] via SUBCUTANEOUS

## 2016-06-14 MED ORDER — SODIUM CHLORIDE 0.9 % IV SOLN
INTRAVENOUS | Status: DC
  Administered 2016-06-14 – 2016-06-15 (×4): via INTRAVENOUS

## 2016-06-14 NOTE — Plan of Care (Signed)
Problem: Education: Goal: Ability to describe self-care measures that may prevent or decrease complications (Diabetes Survival Skills Education) will improve Outcome: Completed/Met Date Met: 06/14/16 Patient was provided living well with diabetes booklet and educated on its contents with teach back. Patient was also shown diabetes education videos with family at bedside.

## 2016-06-14 NOTE — Progress Notes (Signed)
Inpatient Diabetes Program Recommendations  AACE/ADA: New Consensus Statement on Inpatient Glycemic Control (2015)  Target Ranges:  Prepandial:   less than 140 mg/dL      Peak postprandial:   less than 180 mg/dL (1-2 hours)      Critically ill patients:  140 - 180 mg/dL   Lab Results  Component Value Date   GLUCAP 118 (H) 06/14/2016   HGBA1C 6.3 12/14/2015    Review of Glycemic Control  Diabetes history: DM2 Outpatient Diabetes medications: None. Previously taking Invokana 100 qd+Amaryl 2 mg qd+ Janumet 50-1 bid Current orders for Inpatient glycemic control: Lantus 20 units+Novolog correction 0-9 units  Inpatient Diabetes Program Recommendations:  Attempted to speak to patient but patient sound asleep. Will plan to speak to patient in am. Patient has no insurance (unless other resource available) , so would recommend 70/30 insulin on discharge bid which patient can purchase @ [facility] for approx $25 per vial. Patient's father shared that patient does not have meter and strips either so would also be cheapest @ [facility] (meter & strips approx. $25). Patient has required high rate of insulin on glucostabilizer so may need increased amount of insulin. 0.2 units/kg would be 24 units. Spoke with RN and plans to start insulin injection education with patient when appropriate to prepare if goes home on insulin. Requested new A1c. Will follow patient while in the hospital.  Thank you, Michael FischerJudy E. Kelcy Baeten, RN, MSN, CDE Inpatient Glycemic Control Team Team Pager [phone] (8am-5pm) 06/14/2016 12:32 PM

## 2016-06-14 NOTE — Care Management Note (Signed)
Case Management Note  Patient Details  Name: Michael Willis MRN: [id number] Date of Birth: 1975-11-17  Subjective/Objective:   Pt states he has no insurance because he lost his job, has not taken his medications because he can't afford them.   CM reviewed list of pt's home meds and provided information re Glimepiride and Metformin that are available @ [facility]: $4 for 30-day supply or $10 for 90-day supply.  Provided 0 copay card for Invokana, [facility] coupon to get Losartan for $11.26 for 30-day supply with instructions for procuring discount card from [facility] and/or discount card from [employer] [facility], and instructions on applying for [employer] patient assistance program for Lipitor.  Pt will qualify for [facility] Program for 30 day supply of all medications for $3.00 each script upon discharge.  Provided information to pt and relatives in room who plan to assist pt upon discharge.  Pt stated he has a glucose meter and strips @ home, just didn't want to use them.                      Expected Discharge Plan:  Home/Self Care  Discharge planning Services  CM Consult, Medication Assistance, MATCH Program  Status of Service:  In process, will continue to follow  Magdalene RiverMayo, Jakaria Lavergne T, RN 06/14/2016, 3:13 PM

## 2016-06-14 NOTE — Progress Notes (Signed)
Michael Willis  ZOX:[id number] DOB: 07-04-1976 DOA: 06/13/2016 PCP: Michael OraJose Paz, MD    Brief Narrative:  40 y.o. male with history of uncontrolled DM diagnosed about 2 years ago with non-compliance with oral meds due to lack of insurance since 02/2016 who presented with 3 days of diffuse abdominal pain, severe polydipsia and polyuria, generalized malaise, and diarrhea.  In the ED his labs were as follows:  Sodium 129 creatinine 1.86 bicarb 13 glucose 761 annion gap 25   Subjective: The patient is resting comfortably in bed.  He reports he feels very tired in general.  He reports some vague abdominal discomfort but overall states he feels better.  He denies chest pain or shortness of breath.  Assessment & Plan:  Diabetic Ketoacidosis - uncontrolled DM2 DKA resolved / off insulin gtt - CBG variable - follow trend - cont insulin for now   Acute Kidney Injury crt 1.12 in April 2017 - cont to hydrate and recheck in AM  Recent Labs Lab 06/13/16 1214 06/13/16 1527 06/13/16 2229 06/14/16 0204 06/14/16 0605  CREATININE 1.86* 1.84* 2.26* 2.42* 1.93*    Hypertension    BP controlled at present - follow trend   Hyperlipidemia Cont home med regimen   DVT prophylaxis: SQ heparin Code Status: FULL CODE Family Communication: Spoke with mother at bedside Disposition Plan: Possible discharge home tomorrow  Consultants:  none  Procedures: none  Antimicrobials:  none   Objective: Blood pressure 130/74, pulse (!) 103, temperature 98.5 F (36.9 C), temperature source Axillary, resp. rate 18, height 5\' 10"  (1.778 m), weight 121.5 kg (267 lb 13.7 oz), SpO2 96 %.  Intake/Output Summary (Last 24 hours) at 06/14/16 1619 Last data filed at 06/14/16 1400  Gross per 24 hour  Intake          4073.67 ml  Output             1002 ml  Net          3071.67 ml   Filed Weights   06/13/16 1440 06/14/16 0113  Weight: 117.7 kg (259 lb 6 oz) 121.5 kg (267 lb  13.7 oz)    Examination: General: No acute respiratory distress Lungs: Clear to auscultation bilaterally without wheezes or crackles Cardiovascular: Regular rate and rhythm without murmur gallop or rub normal S1 and S2 Abdomen: Nontender, nondistended, soft, bowel sounds positive, no rebound, no ascites, no appreciable mass Extremities: No significant cyanosis, clubbing, or edema bilateral lower extremities  CBC:  Recent Labs Lab 06/13/16 1214  WBC 18.4*  HGB 15.7  HCT 46.1  MCV 84.6  PLT 247   Basic Metabolic Panel:  Recent Labs Lab 06/13/16 1214 06/13/16 1527 06/13/16 2229 06/14/16 0204 06/14/16 0605  NA 129* 132* 135 137 136  K 5.9* 6.4* 4.7 4.4 4.4  CL 91* 97* 103 106 105  CO2 13* 12* 18* 22 21*  GLUCOSE 761* 662* 354* 211* 140*  BUN 16 20 25* 28* 29*  CREATININE 1.86* 1.84* 2.26* 2.42* 1.93*  CALCIUM 10.1 10.0 9.9 9.0 9.3   GFR: Estimated Creatinine Clearance: 67.2 mL/min (by C-G formula based on SCr of 1.93 mg/dL (H)).  Liver Function Tests: No results for input(s): AST, ALT, ALKPHOS, BILITOT, PROT, ALBUMIN in the last 168 hours. No results for input(s): LIPASE, AMYLASE in the last 168 hours. No results for input(s): AMMONIA in the last 168 hours.  Cardiac Enzymes:  Recent Labs Lab 06/13/16 1527 06/13/16 2229 06/14/16 0204 06/14/16  0946  TROPONINI 0.03* 0.16* 0.08* 0.03*    HbA1C: Hgb A1c MFr Bld  Date/Time Value Ref Range Status  12/14/2015 11:19 AM 6.3 4.6 - 6.5 % Final    Comment:    Glycemic Control Guidelines for People with Diabetes:Non Diabetic:  <6%Goal of Therapy: <7%Additional Action Suggested:  >8%   03/10/2015 11:00 AM 5.5 4.6 - 6.5 % Final    Comment:    Glycemic Control Guidelines for People with Diabetes:Non Diabetic:  <6%Goal of Therapy: <7%Additional Action Suggested:  >8%     CBG:  Recent Labs Lab 06/14/16 0605 06/14/16 0706 06/14/16 0811 06/14/16 1313 06/14/16 1611  GLUCAP 143* 148* 118* 243* 311*    Recent  Results (from the past 240 hour(s))  MRSA PCR Screening     Status: None   Collection Time: 06/14/16  1:03 AM  Result Value Ref Range Status   MRSA by PCR NEGATIVE NEGATIVE Final    Comment:        The GeneXpert MRSA Assay (FDA approved for NASAL specimens only), is one component of a comprehensive MRSA colonization surveillance program. It is not intended to diagnose MRSA infection nor to guide or monitor treatment for MRSA infections.      Scheduled Meds: . atorvastatin  40 mg Oral Daily  . heparin  5,000 Units Subcutaneous Q8H  . insulin aspart  0-5 Units Subcutaneous QHS  . insulin aspart  0-9 Units Subcutaneous TID WC  . insulin glargine  20 Units Subcutaneous Daily   Continuous Infusions: . sodium chloride 125 mL/hr at 06/14/16 1041     LOS: 1 day   Lonia Blood, MD [facility] Office  [phone] Pager - Text Page per Loretha Stapler as per below:  On-Call/Text Page:      [email or web address]      password [email or web address]  If 7PM-7AM, please contact night-coverage [email or web address] Password [email or web address] 06/14/2016, 4:19 PM

## 2016-06-15 DIAGNOSIS — G4733 Obstructive sleep apnea (adult) (pediatric): Secondary | ICD-10-CM

## 2016-06-15 LAB — GLUCOSE, CAPILLARY
GLUCOSE-CAPILLARY: 197 mg/dL — AB (ref 65–99)
Glucose-Capillary: 159 mg/dL — ABNORMAL HIGH (ref 65–99)
Glucose-Capillary: 197 mg/dL — ABNORMAL HIGH (ref 65–99)
Glucose-Capillary: 235 mg/dL — ABNORMAL HIGH (ref 65–99)
Glucose-Capillary: 259 mg/dL — ABNORMAL HIGH (ref 65–99)
Glucose-Capillary: 262 mg/dL — ABNORMAL HIGH (ref 65–99)
Glucose-Capillary: 265 mg/dL — ABNORMAL HIGH (ref 65–99)

## 2016-06-15 LAB — COMPREHENSIVE METABOLIC PANEL
ALBUMIN: 2.4 g/dL — AB (ref 3.5–5.0)
ALT: 16 U/L — ABNORMAL LOW (ref 17–63)
AST: 17 U/L (ref 15–41)
Alkaline Phosphatase: 61 U/L (ref 38–126)
Anion gap: 7 (ref 5–15)
BILIRUBIN TOTAL: 1.9 mg/dL — AB (ref 0.3–1.2)
BUN: 13 mg/dL (ref 6–20)
CHLORIDE: 107 mmol/L (ref 101–111)
CO2: 21 mmol/L — ABNORMAL LOW (ref 22–32)
Calcium: 8.6 mg/dL — ABNORMAL LOW (ref 8.9–10.3)
Creatinine, Ser: 1.02 mg/dL (ref 0.61–1.24)
GFR calc Af Amer: >60 mL/min (ref >60)
GFR calc non Af Amer: >60 mL/min (ref >60)
GLUCOSE: 222 mg/dL — AB (ref 65–99)
POTASSIUM: 4.1 mmol/L (ref 3.5–5.1)
Sodium: 135 mmol/L (ref 135–145)
TOTAL PROTEIN: 6 g/dL — AB (ref 6.5–8.1)

## 2016-06-15 LAB — CBC
HEMATOCRIT: 32.3 % — AB (ref 39.0–52.0)
Hemoglobin: 10.5 g/dL — ABNORMAL LOW (ref 13.0–17.0)
MCH: 27.6 pg (ref 26.0–34.0)
MCHC: 32.2 g/dL (ref 30.0–36.0)
MCV: 85.7 fL (ref 78.0–100.0)
Platelets: 182 10*3/uL (ref 150–400)
RBC: 3.77 MIL/uL — ABNORMAL LOW (ref 4.22–5.81)
RDW: 14.8 % (ref 11.5–15.5)
WBC: 11.5 10*3/uL — ABNORMAL HIGH (ref 4.0–10.5)

## 2016-06-15 LAB — TROPONIN I
TROPONIN I: 0.07 ng/mL — AB (ref <0.03)
Troponin I: 0.79 ng/mL (ref <0.03)

## 2016-06-15 MED ORDER — INSULIN ASPART PROT & ASPART (70-30 MIX) 100 UNIT/ML ~~LOC~~ SUSP
36.0000 [IU] | Freq: Two times a day (BID) | SUBCUTANEOUS | Status: DC
  Filled 2016-06-15: qty 10

## 2016-06-15 MED ORDER — ISOSORB DINITRATE-HYDRALAZINE 20-37.5 MG PO TABS
1.0000 | ORAL_TABLET | Freq: Three times a day (TID) | ORAL | Status: DC
  Administered 2016-06-15 – 2016-06-16 (×3): 1 via ORAL
  Filled 2016-06-15 (×3): qty 1

## 2016-06-15 MED ORDER — ASPIRIN EC 81 MG PO TBEC
81.0000 mg | DELAYED_RELEASE_TABLET | Freq: Every day | ORAL | Status: DC
  Administered 2016-06-15 – 2016-06-16 (×2): 81 mg via ORAL
  Filled 2016-06-15 (×3): qty 1

## 2016-06-15 MED ORDER — INSULIN ASPART PROT & ASPART (70-30 MIX) 100 UNIT/ML ~~LOC~~ SUSP
38.0000 [IU] | Freq: Two times a day (BID) | SUBCUTANEOUS | Status: DC
  Administered 2016-06-15 – 2016-06-16 (×2): 38 [IU] via SUBCUTANEOUS
  Filled 2016-06-15: qty 10

## 2016-06-15 NOTE — Plan of Care (Signed)
Problem: Food- and Nutrition-Related Knowledge Deficit (NB-1.1) Goal: Nutrition education Formal process to instruct or train a patient/client in a skill or to impart knowledge to help patients/clients voluntarily manage or modify food choices and eating behavior to maintain or improve health. Outcome: Completed/Met Date Met: 06/15/16  RD consulted for nutrition education regarding diabetes.   Lab Results  Component Value Date   HGBA1C 6.3 12/14/2015    RD provided "Carbohydrate Counting for People with Diabetes" handout from the Academy of Nutrition and Dietetics. Discussed different food groups and their effects on blood sugar, emphasizing carbohydrate-containing foods. Provided list of carbohydrates and recommended serving sizes of common foods.  Discussed importance of controlled and consistent carbohydrate intake throughout the day. Provided examples of ways to balance meals/snacks and encouraged intake of high-fiber, whole grain complex carbohydrates. Teach back method used.  Expect fair compliance.  Body mass index is 38.43 kg/m. Pt meets criteria for Obesity Class II based on current BMI.  Current diet order is Carbohydrate Modified, patient is consuming approximately 100% of meals at this time.   Labs and medications reviewed.   No further nutrition interventions warranted at this time.   If additional nutrition issues arise, please re-consult RD.  Arthur Holms, RD, LDN Pager #: [phone] After-Hours Pager #: [phone]

## 2016-06-15 NOTE — Progress Notes (Signed)
Inpatient Diabetes Program Recommendations  AACE/ADA: New Consensus Statement on Inpatient Glycemic Control (2015)  Target Ranges:  Prepandial:   less than 140 mg/dL      Peak postprandial:   less than 180 mg/dL (1-2 hours)      Critically ill patients:  140 - 180 mg/dL   Review of Glycemic Control Inpatient Diabetes Program Recommendations:    Please add A1c lab to determine prehospital glycemic control.  Spoke with patient @ bedside and patient explained he lost his job and without insurance until his wife will be able to add patient to her insurance in January 2018. Reviewed with patient A1c information without A1c available @ present and reviewed risks of elevated CBGs. Reviewed with patient 70/30 available @ [facility] for approx. $25. Currently has meter and strips @ [place] but [facility] and reviewed options with patient.  Thank you, Billy FischerJudy E. Lorik Guo, RN, MSN, CDE Inpatient Glycemic Control Team Team Pager [phone] (8am-5pm) 06/15/2016 3:04 PM

## 2016-06-15 NOTE — Progress Notes (Signed)
[place] TEAM 1 - Stepdown/ICU TEAM  Michael Willis  JXB:[id number] DOB: 1975/11/14 DOA: 06/13/2016 PCP: Willow OraJose Paz, MD    Brief Narrative:  40 y.o. male with history of uncontrolled DM diagnosed about 2 years ago with non-compliance with oral meds due to lack of insurance since 02/2016 who presented with 3 days of diffuse abdominal pain, severe polydipsia and polyuria, generalized malaise, and diarrhea.  In the ED his labs were as follows:  Sodium 129 creatinine 1.86 bicarb 13 glucose 761 annion gap 25   Subjective: The patient is alert and conversant.  He admits to me that he feels a little uncomfortable giving himself insulin but states that his father can help him as he administers insulin to the patient's mother.  He denies current chest pain shortness breath fevers chills nausea or vomiting.  He reports that his appetite has returned and he is hungry.  Assessment & Plan:  Diabetic Ketoacidosis - uncontrolled DM2 DKA resolved / off insulin gtt - CBG remains poorly controlled - it appears clear he will need to continue insulin after discharge -  I will make further adjustment to his insulin regimen and strive for better CBG control prior to discharge as I fear discharge today would lead to a high likelihood of recurrent DKA in short course - the staff will continue to work to educate him on dosing insulin and measuring CBGs   Acute Kidney Injury crt 1.12 in April 2017 - resolved with volume resuscitation  - avoid ACE inhibitor for now   Recent Labs Lab 06/13/16 1527 06/13/16 2229 06/14/16 0204 06/14/16 0605 06/15/16 0528  CREATININE 1.84* 2.26* 2.42* 1.93* 1.02    Hypertension    Blood pressure trending upward - avoid ACE inhibitor for now but can be initiated the outpatient setting if creatinine remains stable  - adjust medical therapy and follow trend   Hyperlipidemia Cont home med regimen   Obesity - Body mass index is 38.43 kg/m.   DVT prophylaxis: SQ heparin Code  Status: FULL CODE Family Communication: no family present today  Disposition Plan: plan for discharge home when CBGs more consistently 200 or less   Consultants:  none  Procedures: none  Antimicrobials:  none   Objective: Blood pressure (!) 158/94, pulse 92, temperature 98.4 F (36.9 C), temperature source Oral, resp. rate 15, height 5\' 10"  (1.778 m), weight 121.5 kg (267 lb 13.7 oz), SpO2 99 %.  Intake/Output Summary (Last 24 hours) at 06/15/16 1445 Last data filed at 06/15/16 0830  Gross per 24 hour  Intake           2312.5 ml  Output                0 ml  Net           2312.5 ml   Filed Weights   06/13/16 1440 06/14/16 0113  Weight: 117.7 kg (259 lb 6 oz) 121.5 kg (267 lb 13.7 oz)    Examination: General: No acute respiratory distress - alert and conversant  Lungs: Clear to auscultation bilaterally without wheeze Cardiovascular: Regular rate and rhythm without murmur  Abdomen: Nontender, nondistended, soft, bowel sounds positive, no rebound - obese Extremities: No significant cyanosis, clubbing, edema bilateral lower extremities  CBC:  Recent Labs Lab 06/13/16 1214 06/15/16 0528  WBC 18.4* 11.5*  HGB 15.7 10.5*  HCT 46.1 32.3*  MCV 84.6 85.7  PLT 247 182   Basic Metabolic Panel:  Recent Labs Lab 06/13/16 1527 06/13/16 2229 06/14/16 [id number]  06/14/16 0605 06/15/16 0528  NA 132* 135 137 136 135  K 6.4* 4.7 4.4 4.4 4.1  CL 97* 103 106 105 107  CO2 12* 18* 22 21* 21*  GLUCOSE 662* 354* 211* 140* 222*  BUN 20 25* 28* 29* 13  CREATININE 1.84* 2.26* 2.42* 1.93* 1.02  CALCIUM 10.0 9.9 9.0 9.3 8.6*   GFR: Estimated Creatinine Clearance: 127.1 mL/min (by C-G formula based on SCr of 1.02 mg/dL).  Liver Function Tests:  Recent Labs Lab 06/15/16 0528  AST 17  ALT 16*  ALKPHOS 61  BILITOT 1.9*  PROT 6.0*  ALBUMIN 2.4*    Cardiac Enzymes:  Recent Labs Lab 06/14/16 0946 06/14/16 1528 06/14/16 2007 06/15/16 0148 06/15/16 0747  TROPONINI 0.03*  0.10* 0.04* 0.79* 0.07*    HbA1C: Hgb A1c MFr Bld  Date/Time Value Ref Range Status  12/14/2015 11:19 AM 6.3 4.6 - 6.5 % Final    Comment:    Glycemic Control Guidelines for People with Diabetes:Non Diabetic:  <6%Goal of Therapy: <7%Additional Action Suggested:  >8%   03/10/2015 11:00 AM 5.5 4.6 - 6.5 % Final    Comment:    Glycemic Control Guidelines for People with Diabetes:Non Diabetic:  <6%Goal of Therapy: <7%Additional Action Suggested:  >8%     CBG:  Recent Labs Lab 06/15/16 0017 06/15/16 0434 06/15/16 0809 06/15/16 0859 06/15/16 1222  GLUCAP 197* 197* 262* 265* 259*    Recent Results (from the past 240 hour(s))  MRSA PCR Screening     Status: None   Collection Time: 06/14/16  1:03 AM  Result Value Ref Range Status   MRSA by PCR NEGATIVE NEGATIVE Final    Comment:        The GeneXpert MRSA Assay (FDA approved for NASAL specimens only), is one component of a comprehensive MRSA colonization surveillance program. It is not intended to diagnose MRSA infection nor to guide or monitor treatment for MRSA infections.      Scheduled Meds: . aspirin EC  81 mg Oral Daily  . atorvastatin  40 mg Oral Daily  . heparin  5,000 Units Subcutaneous Q8H  . insulin aspart  0-20 Units Subcutaneous TID WC  . insulin aspart  0-5 Units Subcutaneous QHS  . insulin aspart protamine- aspart  36 Units Subcutaneous BID WC   Continuous Infusions: . sodium chloride 125 mL/hr at 06/15/16 1320     LOS: 2 days   Lonia Blood, MD [facility] Office  [phone] Pager - Text Page per Loretha Stapler as per below:  On-Call/Text Page:      [email or web address]      password [email or web address]  If 7PM-7AM, please contact night-coverage [email or web address] Password [email or web address] 06/15/2016, 2:45 PM

## 2016-06-15 NOTE — Progress Notes (Signed)
CRITICAL VALUE ALERT  Critical value received:  Troponin 0.79  Date of notification: 06/15/16  Time of notification:  0319  Critical value read back: yes  Nurse who received alert:  Kizzie BaneAmy Mckynzi Cammon  MD notified (1st page): Toniann FailKakrakandy, MD  Time of first page:  0320  Responding MD:  Toniann FailKakrakandy, MD  Time MD responded:  [phone]

## 2016-06-16 LAB — BASIC METABOLIC PANEL
Anion gap: 10 (ref 5–15)
BUN: 8 mg/dL (ref 6–20)
CHLORIDE: 102 mmol/L (ref 101–111)
CO2: 24 mmol/L (ref 22–32)
CREATININE: 0.96 mg/dL (ref 0.61–1.24)
Calcium: 8.6 mg/dL — ABNORMAL LOW (ref 8.9–10.3)
GFR calc non Af Amer: >60 mL/min (ref >60)
Glucose, Bld: 185 mg/dL — ABNORMAL HIGH (ref 65–99)
POTASSIUM: 3.3 mmol/L — AB (ref 3.5–5.1)
SODIUM: 136 mmol/L (ref 135–145)

## 2016-06-16 LAB — GLUCOSE, CAPILLARY
GLUCOSE-CAPILLARY: 290 mg/dL — AB (ref 65–99)
Glucose-Capillary: 258 mg/dL — ABNORMAL HIGH (ref 65–99)

## 2016-06-16 LAB — TROPONIN I: Troponin I: <0.03 ng/mL (ref <0.03)

## 2016-06-16 MED ORDER — LISINOPRIL 5 MG PO TABS: 5.0000 mg | ORAL_TABLET | Freq: Every day | ORAL | Status: DC

## 2016-06-16 MED ORDER — LISINOPRIL 5 MG PO TABS: 5.0000 mg | ORAL_TABLET | Freq: Every day | ORAL | 0 refills | Status: DC

## 2016-06-16 MED ORDER — INSULIN ASPART PROT & ASPART (70-30 MIX) 100 UNIT/ML ~~LOC~~ SUSP: 40.0000 [IU] | Freq: Two times a day (BID) | SUBCUTANEOUS | 1 refills | Status: DC

## 2016-06-16 MED ORDER — ASPIRIN 81 MG PO TBEC: 81.0000 mg | DELAYED_RELEASE_TABLET | Freq: Every day | ORAL | Status: DC

## 2016-06-16 MED ORDER — ATORVASTATIN CALCIUM 40 MG PO TABS: 40.0000 mg | ORAL_TABLET | Freq: Every day | ORAL | 0 refills | Status: DC

## 2016-06-16 MED ORDER — "INSULIN SYRINGE 28G X 1/2"" 0.5 ML MISC": 1.0000 | Freq: Two times a day (BID) | 1 refills | Status: DC

## 2016-06-16 MED ORDER — INSULIN STARTER KIT- SYRINGES (ENGLISH)
1.0000 | Freq: Once | Status: AC
  Administered 2016-06-16: 1
  Filled 2016-06-16: qty 1

## 2016-06-16 MED ORDER — HYDROCHLOROTHIAZIDE 12.5 MG PO CAPS: 12.5000 mg | ORAL_CAPSULE | Freq: Every day | ORAL | Status: DC

## 2016-06-16 MED ORDER — POTASSIUM CHLORIDE CRYS ER 20 MEQ PO TBCR
40.0000 meq | EXTENDED_RELEASE_TABLET | Freq: Once | ORAL | Status: AC
  Administered 2016-06-16: 40 meq via ORAL
  Filled 2016-06-16: qty 2

## 2016-06-16 MED ORDER — HYDROCHLOROTHIAZIDE 12.5 MG PO CAPS: 12.5000 mg | ORAL_CAPSULE | Freq: Every day | ORAL | 0 refills | Status: DC

## 2016-06-16 MED ORDER — LIVING WELL WITH DIABETES BOOK
Freq: Once | Status: DC
  Filled 2016-06-16: qty 1

## 2016-06-16 NOTE — Discharge Summary (Addendum)
DISCHARGE SUMMARY  Michael Willis  MR#: [id number]  DOB:August 23, 1975  Date of Admission: 06/13/2016 Date of Discharge: 06/16/2016  Attending Physician:Tenelle Andreason T  Patient's WUJ:WJXBPCP:Michael Drue NovelPaz, MD  Consults:  none  Disposition: D/C home   Follow-up Appts: Follow-up Information    Michael OraJose Paz, MD Follow up in 1 week(s).   Specialty:  Internal Medicine Contact information: [address] [place] [id number] [phone]          Tests Needing Follow-up: -check of CBG log should be performed, w/ titration of insulin likely to be required  -f/u of renal function w/ initiation of ACEi and low dose diuretic at time of d/c  -f/u of electrolytes w/ initiation of low dose diuretic   Discharge Diagnoses: Diabetic Ketoacidosis - uncontrolled DM2 Acute Kidney Injury Hypertension  Hyperlipidemia Obesity - Body mass index is 38.43 kg/m  Initial presentation: 40 y.o.malewith history of uncontrolled DM diagnosed about 2 years ago with non-compliance with oral meds due to lack of insurance since 02/2016 who presented with 3 days of diffuse abdominal pain, severe polydipsia and polyuria, generalized malaise, and diarrhea.  In the ED his labs were as follows:  Sodium129 creatinine 1.86 bicarb 13 glucose 761 annion gap 25   Hospital Course:  Diabetic Ketoacidosis - uncontrolled DM2 DKA resolved / off insulin gtt - CBG remains poorly controlled - it appears clear he will need to continue insulin after discharge -  I will make further adjustment to his insulin regimen and strive for better CBG control prior to discharge as I fear discharge today would lead to a high likelihood of recurrent DKA in short course - the staff will continue to work to educate him on dosing insulin and measuring CBGs   Acute Kidney Injury crt 1.12 in April 2017 - resolved with volume resuscitation  - avoid ACE inhibitor for now   Hypertension  Blood pressure trending upward - avoid ACE  inhibitor for now but can be initiated the outpatient setting if creatinine remains stable  - adjust medical therapy and follow trend   Hyperlipidemia Cont home med regimen   Obesity - Body mass index is 38.43 kg/m.    Medication List    TAKE these medications   aspirin 81 MG EC tablet Take 1 tablet (81 mg total) by mouth daily. Start taking on:  06/17/2016   atorvastatin 40 MG tablet Commonly known as:  LIPITOR Take 1 tablet (40 mg total) by mouth daily. Start taking on:  06/17/2016   hydrochlorothiazide 12.5 MG capsule Commonly known as:  MICROZIDE Take 1 capsule (12.5 mg total) by mouth daily. Start taking on:  06/17/2016   insulin aspart protamine- aspart (70-30) 100 UNIT/ML injection Commonly known as:  NOVOLOG MIX 70/30 Inject 0.4 mLs (40 Units total) into the skin 2 (two) times daily with a meal.   INSULIN SYRINGE .5CC/28G 28G X 1/2" 0.5 ML Misc 1 Syringe by Does not apply route 2 (two) times daily.   lisinopril 5 MG tablet Commonly known as:  PRINIVIL,ZESTRIL Take 1 tablet (5 mg total) by mouth daily. Start taking on:  06/17/2016       Day of Discharge BP 138/82   Pulse 99   Temp 98.9 F (37.2 C) (Oral)   Resp 18   Ht 5\' 10"  (1.778 m)   Wt 121.5 kg (267 lb 13.7 oz)   SpO2 97%   BMI 38.43 kg/m   Physical Exam: General: No acute respiratory distress Lungs: Clear to auscultation bilaterally  without wheezes or crackles Cardiovascular: Regular rate and rhythm without murmur gallop or rub normal S1 and S2 Abdomen: Nontender, nondistended, soft, bowel sounds positive, no rebound, no ascites, no appreciable mass Extremities: No significant cyanosis, clubbing, or edema bilateral lower extremities  Basic Metabolic Panel:  Recent Labs Lab 06/13/16 2229 06/14/16 0204 06/14/16 0605 06/15/16 0528 06/16/16 0133  NA 135 137 136 135 136  K 4.7 4.4 4.4 4.1 3.3*  CL 103 106 105 107 102  CO2 18* 22 21* 21* 24  GLUCOSE 354* 211* 140* 222* 185*  BUN 25*  28* 29* 13 8  CREATININE 2.26* 2.42* 1.93* 1.02 0.96  CALCIUM 9.9 9.0 9.3 8.6* 8.6*    Liver Function Tests:  Recent Labs Lab 06/15/16 0528  AST 17  ALT 16*  ALKPHOS 61  BILITOT 1.9*  PROT 6.0*  ALBUMIN 2.4*    CBC:  Recent Labs Lab 06/13/16 1214 06/15/16 0528  WBC 18.4* 11.5*  HGB 15.7 10.5*  HCT 46.1 32.3*  MCV 84.6 85.7  PLT 247 182    Cardiac Enzymes:  Recent Labs Lab 06/14/16 1528 06/14/16 2007 06/15/16 0148 06/15/16 0747 06/16/16 0133  TROPONINI 0.10* 0.04* 0.79* 0.07* <0.03    CBG:  Recent Labs Lab 06/15/16 1222 06/15/16 1656 06/15/16 2122 06/16/16 0842 06/16/16 1206  GLUCAP 259* 235* 159* 290* 258*    Recent Results (from the past 240 hour(s))  MRSA PCR Screening     Status: None   Collection Time: 06/14/16  1:03 AM  Result Value Ref Range Status   MRSA by PCR NEGATIVE NEGATIVE Final    Comment:        The GeneXpert MRSA Assay (FDA approved for NASAL specimens only), is one component of a comprehensive MRSA colonization surveillance program. It is not intended to diagnose MRSA infection nor to guide or monitor treatment for MRSA infections.      Time spent in discharge (includes decision making & examination of pt): >30 minutes  06/16/2016, 1:10 PM   Lonia BloodJeffrey T. Shatori Bertucci, MD [facility] Office  [phone] Pager [phone]  On-Call/Text Page:      [email or web address]      password [email or web address]

## 2016-06-16 NOTE — Progress Notes (Signed)
Pt asked , " so how may pieces of papa john pizza can I have at once ? Encouraged pt to have a salad and 1 piece of pizza -  with some carrot sticks. Pt says he will eat no more than 2 than at a time. Family in room Pt reminded of all the materila that dietician brought to his room on 10/27 Pt does not know how to carb count. Went over [employer] and how not to have too many all together at one meal.

## 2016-06-16 NOTE — Discharge Instructions (Signed)
Insulin Treatment for Diabetes  Diabetes is a disease that does not go away (chronic). It occurs when the body does not properly use the sugar (glucose) that is released from food after it is digested. Glucose levels are controlled by a hormone called insulin, which is made by your pancreas. Depending on the type of diabetes you have, either of the following will apply:   The pancreas does not make any insulin (type 1 diabetes).  The pancreas makes too little insulin, and the body cannot respond normally to the insulin that is made (type 2 diabetes). Without insulin, death can occur. However, with the addition of insulin, blood sugar monitoring, and treatment, someone with diabetes can live a full and productive life. This document will discuss the role of insulin in your treatment and provide information about its use.  HOW IS INSULIN GIVEN? Insulin is a medicine that can only be given by injection. Taking it by mouth makes it inactive because of the acid in your stomach. Insulin is injected under the skin by a syringe and needle, an insulin pen, a pump, or a jet injector. Your dose will be determined by your health care provider based on your individual needs. You will also be given guidance on which method of giving insulin is right for you. Remember that if you give insulin with a needle and syringe, you must do so using only a special insulin syringe made for this purpose. WHERE ON THE BODY SHOULD INSULIN BE INJECTED? Insulin is injected into the fatty layer of tissue just under your skin. Good places to inject insulin include the upper arm, the front and outer area of the thigh, the hips, and the abdomen. Giving your insulin in the abdomen is preferred because this provides the most rapid and consistent absorption. Avoid the area 2 inches (5 cm) around the navel and avoid injecting into areas on your body with scar tissue. In addition, it is important to rotate your injection sites with every shot  to prevent irritation and improve absorption. WHAT ARE THE DIFFERENT TYPES OF INSULIN?  If you have type 1 diabetes, you must take insulin to stay alive. Your body does not produce it. If you have type 2 diabetes, you might require insulin in addition to, or instead of, other medicines. In either case, proper use of insulin is critical to control your diabetes.  There are a number of different types of insulin. Usually, you will give yourself injections, though others can be trained to give them to you. Some people have an insulin pump that delivers insulin continuously through a tube (cannula) that is placed under the skin. Using insulin requires that you check your blood sugar several times a day. The exact number of times and time of day to check will vary depending on your type of diabetes, your type of insulin, and treatment goals. Your health care provider will direct you.  Generally, different insulins have different properties. The following is a general guide. Specifics will vary by product, and new products are introduced periodically.   Rapid-acting insulin starts working quickly (in as little as 5 minutes) and wears off in 4 to 6 hours (sometimes longer). This type of insulin works well when taken just before a meal to bring your blood sugar quickly back to normal.   Short-acting insulin starts working in about 30 minutes and can last 6 to 10 hours. This type of insulin should be taken about 30 minutes before you start eating a meal.  Intermediate-acting insulin starts working in 1-2 hours and wears off after about 10 to 18 hours. This insulin will lower your blood sugar for a longer period of time, but it will not be as effective in lowering your blood sugar right after a meal.   Long-acting insulin mimics the small amount of insulin that your pancreas usually produces throughout the day. You need to have some insulin present at all times. It is crucial to the metabolism of brain cells  and other cells. Long-acting insulin is meant to be used either once or twice a day. It is usually used in combination with other types of insulin, or in combination with other diabetes medicines.  Discuss the type of insulin you are taking with your health care provider or pharmacist. You will then be aware of when the insulin can be expected to peak and when it will wear off. This is important to know so you can plan for meal times and periods of exercise.  Your health care provider will usually have a strategy in mind when treating you with insulin. This will vary with your type of diabetes, your diabetes treatment goals, and your health history. It is important that you understand this strategy so you can be an active partner in treating your diabetes. Here are some terms you might hear:   Basal insulin. This refers to the small amount of insulin that needs to be present in your blood at all times. Sometimes oral medicines will be enough. For other people, and especially for people with type 1 diabetes, insulin is needed. Usually, intermediate-acting or long-acting insulin is used once or twice a day to accomplish this.   Prandial (meal-related) insulin. Your blood sugar will rise rapidly after a meal. Rapid-acting or short-acting insulin can be used right before the meal to bring your blood sugar back to normal quickly. You might be instructed to adjust the amount of insulin depending on how much carbohydrate (starch) is in your meal.   Corrective insulin. You might be instructed to check your blood sugar at certain times of the day. You then might use a small amount of rapid-acting or short-acting insulin to bring the blood sugar down to normal if it is elevated.   Tight control (also called intensive therapy). Tight control means keeping your blood sugar as close to your target as possible and keeping it from going too high after meals. People with tight control of their diabetes are shown to  have fewer long-term problems from their diabetes.   Glycohemoglobin (also called glyco, glycosylated hemoglobin, hemoglobin A1c, or A1c) level. This measures how well your blood sugar has been controlled during the past 1 to 3 months. It helps your health care provider see how effective your treatment is and decide if any changes are needed. Your health care provider will discuss your target glycohemoglobin level with you.  Insulin treatment requires your careful attention. While you are being treated with insulin, you should check your blood glucose at least two times each day. Treatment plans will be different for different people. Some people do well with a simple program. Others require more complicated programs, with multiple insulin injections daily. You will work with your health care provider to develop the best program for you. Regardless of your insulin treatment plan, you must also do your best on weight control, diet and food choices, exercise, blood pressure control, cholesterol control, and stress levels.  WHAT ARE THE SIDE EFFECTS OF INSULIN? Although insulin treatment is  important, it does have some side effects, such as:   Insulin can cause your blood sugar to go too low (hypoglycemia).   Weight gain can occur.   Improper injection technique can cause hypoglycemia, blood sugar to go too high (hyperglycemia), skin injury or irritation, or other problems. You must learn to inject insulin properly.   This information is not intended to replace advice given to you by your health care provider. Make sure you discuss any questions you have with your health care provider.   Document Released: 11/02/2008 Document Revised: 08/27/2014 Document Reviewed: 01/18/2013 Elsevier Interactive Patient Education 2016 [employer]  Diabetes Mellitus and Food  It is important for you to manage your blood sugar (glucose) level. Your blood glucose level can be greatly affected by what you eat.  Eating healthier foods in the appropriate amounts throughout the day at about the same time each day will help you control your blood glucose level. It can also help slow or prevent worsening of your diabetes mellitus. Healthy eating may even help you improve the level of your blood pressure and reach or maintain a healthy weight.  General recommendations for healthful eating and cooking habits include:  Eating meals and snacks regularly. Avoid going long periods of time without eating to lose weight.  Eating a diet that consists mainly of plant-based foods, such as fruits, vegetables, nuts, legumes, and whole grains.  Using low-heat cooking methods, such as baking, instead of high-heat cooking methods, such as deep frying. Work with your dietitian to make sure you understand how to use the Nutrition Facts information on food labels. HOW CAN FOOD AFFECT ME? Carbohydrates Carbohydrates affect your blood glucose level more than any other type of food. Your dietitian will help you determine how many carbohydrates to eat at each meal and teach you how to count carbohydrates. Counting carbohydrates is important to keep your blood glucose at a healthy level, especially if you are using insulin or taking certain medicines for diabetes mellitus. Alcohol Alcohol can cause sudden decreases in blood glucose (hypoglycemia), especially if you use insulin or take certain medicines for diabetes mellitus. Hypoglycemia can be a life-threatening condition. Symptoms of hypoglycemia (sleepiness, dizziness, and disorientation) are similar to symptoms of having too much alcohol.  If your health care provider has given you approval to drink alcohol, do so in moderation and use the following guidelines:  Women should not have more than one drink per day, and men should not have more than two drinks per day. One drink is equal to:  12 oz of beer.  5 oz of wine.  1 oz of hard liquor.  Do not drink on an empty  stomach.  Keep yourself hydrated. Have water, diet soda, or unsweetened iced tea.  Regular soda, juice, and other mixers might contain a lot of carbohydrates and should be counted. WHAT FOODS ARE NOT RECOMMENDED? As you make food choices, it is important to remember that all foods are not the same. Some foods have fewer nutrients per serving than other foods, even though they might have the same number of calories or carbohydrates. It is difficult to get your body what it needs when you eat foods with fewer nutrients. Examples of foods that you should avoid that are high in calories and carbohydrates but low in nutrients include:  Trans fats (most processed foods list trans fats on the Nutrition Facts label).  Regular soda.  Juice.  Candy.  Sweets, such as cake, pie, doughnuts, and cookies.  Fried foods. WHAT FOODS CAN I EAT? Eat nutrient-rich foods, which will nourish your body and keep you healthy. The food you should eat also will depend on several factors, including:  The calories you need.  The medicines you take.  Your weight.  Your blood glucose level.  Your blood pressure level.  Your cholesterol level. You should eat a variety of foods, including:  Protein.  Lean cuts of meat.  Proteins low in saturated fats, such as fish, egg whites, and beans. Avoid processed meats.  Fruits and vegetables.  Fruits and vegetables that may help control blood glucose levels, such as apples, mangoes, and yams.  Dairy products.  Choose fat-free or low-fat dairy products, such as milk, yogurt, and cheese.  Grains, bread, pasta, and rice.  Choose whole grain products, such as multigrain bread, whole oats, and brown rice. These foods may help control blood pressure.  Fats.  Foods containing healthful fats, such as nuts, avocado, olive oil, canola oil, and fish. DOES EVERYONE WITH DIABETES MELLITUS HAVE THE SAME MEAL PLAN? Because every person with diabetes mellitus is  different, there is not one meal plan that works for everyone. It is very important that you meet with a dietitian who will help you create a meal plan that is just right for you.   This information is not intended to replace advice given to you by your health care provider. Make sure you discuss any questions you have with your health care provider.   Document Released: 05/03/2005 Document Revised: 08/27/2014 Document Reviewed: 07/03/2013 Elsevier Interactive Patient Education 2016 [employer].   Diabetes and Exercise  Exercising regularly is important. It is not just about losing weight. It has many health benefits, such as:  Improving your overall fitness, flexibility, and endurance.  Increasing your bone density.  Helping with weight control.  Decreasing your body fat.  Increasing your muscle strength.  Reducing stress and tension.  Improving your overall health. People with diabetes who exercise gain additional benefits because exercise:  Reduces appetite.  Improves the body's use of blood sugar (glucose).  Helps lower or control blood glucose.  Decreases blood pressure.  Helps control blood lipids (such as cholesterol and triglycerides).  Improves the body's use of the hormone insulin by:  Increasing the body's insulin sensitivity.  Reducing the body's insulin needs.  Decreases the risk for heart disease because exercising:  Lowers cholesterol and triglycerides levels.  Increases the levels of good cholesterol (such as high-density lipoproteins [HDL]) in the body.  Lowers blood glucose levels. YOUR ACTIVITY PLAN  Choose an activity that you enjoy, and set realistic goals. To exercise safely, you should begin practicing any new physical activity slowly, and gradually increase the intensity of the exercise over time. Your health care provider or diabetes educator can help create an activity plan that works for you. General recommendations  include:  Encouraging children to engage in at least 60 minutes of physical activity each day.  Stretching and performing strength training exercises, such as yoga or weight lifting, at least 2 times per week.  Performing a total of at least 150 minutes of moderate-intensity exercise each week, such as brisk walking or water aerobics.  Exercising at least 3 days per week, making sure you allow no more than 2 consecutive days to pass without exercising.  Avoiding long periods of inactivity (90 minutes or more). When you have to spend an extended period of time sitting down, take frequent breaks to walk or stretch. RECOMMENDATIONS FOR EXERCISING  WITH TYPE 1 OR TYPE 2 DIABETES   Check your blood glucose before exercising. If blood glucose levels are greater than 240 mg/dL, check for urine ketones. Do not exercise if ketones are present.  Avoid injecting insulin into areas of the body that are going to be exercised. For example, avoid injecting insulin into:  The arms when playing tennis.  The legs when jogging.  Keep a record of:  Food intake before and after you exercise.  Expected peak times of insulin action.  Blood glucose levels before and after you exercise.  The type and amount of exercise you have done.  Review your records with your health care provider. Your health care provider will help you to develop guidelines for adjusting food intake and insulin amounts before and after exercising.  If you take insulin or oral hypoglycemic agents, watch for signs and symptoms of hypoglycemia. They include:  Dizziness.  Shaking.  Sweating.  Chills.  Confusion.  Drink plenty of water while you exercise to prevent dehydration or heat stroke. Body water is lost during exercise and must be replaced.  Talk to your health care provider before starting an exercise program to make sure it is safe for you. Remember, almost any type of activity is better than none.   This  information is not intended to replace advice given to you by your health care provider. Make sure you discuss any questions you have with your health care provider.   Document Released: 10/27/2003 Document Revised: 12/21/2014 Document Reviewed: 01/13/2013 Elsevier Interactive Patient Education [employer]

## 2016-06-16 NOTE — Progress Notes (Signed)
Went over all discharge orders with pt. Aware he is to check his sugar before he ever gives himself an Insulin injection. Prescriptions given - says he has meter and strips at home. Pt aware he is to take his insulin 2 times daily with meals - breakfast and dinner. Wife in room listening to directions

## 2016-06-16 NOTE — Progress Notes (Signed)
Pt discharged per w/c accompanied by nurse tech, wife and mother Pt has all belongings including insulin starter kit and all d/c info Aware he is to make an appointment to see PCP within 1 week and start with NPH insulin at dinnertime tonight - other meds are due in am. Wife has prescriptions.

## 2016-06-16 NOTE — Progress Notes (Signed)
Pt says he has never injected himself with insulin before - was on pills at home. Instructed pt on how to inject - watched pt clean the site and inject properly. Had good technique - his father and wife watched pt inject himself. Pt's father said he used to do them for his wife. Encouraged pt to " own" his disease process and not rely on others to inject his insulin. Discussed alternate sites to inject including upper leg and stomach. Showed pt how to inject in arm using the doorway to hold the upper arm injection area forward. Went over many exit care notes with pt including both Regular and NPH insulins including action times and when to give, diabetes and sick day rules management, , DKA, including s/s of hyper & hypo glycemic symptoms, DM and food choices,DM and foot care, Diab & exercise, Blood glucose monitoring, Basic Carb counting for DM. Ordered home kit for pt. Went over how to draw up Insulin in the syringe.Pt able to demonstrate with teachback.

## 2016-06-18 ENCOUNTER — Telehealth: Payer: Self-pay | Admitting: Internal Medicine

## 2016-06-18 ENCOUNTER — Telehealth: Payer: Self-pay | Admitting: *Deleted

## 2016-06-18 NOTE — Telephone Encounter (Signed)
Transition Care Management Follow-up Telephone Call  Date discharged? 06/16/16    How have you been since you were released from the hospital? "good"   Do you understand why you were in the hospital? yes   Do you understand the discharge instructions? yes   Where were you discharged to? Home   Items Reviewed:  Medications reviewed: yes  Allergies reviewed: yes  Dietary changes reviewed: yes  Referrals reviewed: yes   Functional Questionnaire:   Activities of Daily Living (ADLs):   He states they are independent in the following: ambulation, bathing and hygiene, feeding, continence, grooming, toileting and dressing States they require assistance with the following: none     Any transportation issues/concerns?: no   Any patient concerns? yes, not able to afford medication   Confirmed importance and date/time of follow-up visits scheduled yes, 06/20/16 @1130   Provider Appointment booked with Dr.Paz.  Confirmed with patient if condition begins to worsen call PCP or go to the ER.  Patient was given the office number and encouraged to call back with question or concerns.  : yes

## 2016-06-18 NOTE — Telephone Encounter (Signed)
Please call the patient, was recently discharge from the hospital on insulin, needs a follow-up. Part of the problem is he does not have insurance, please encourage him to follow-up with this office anyway, the system offers help to patients in his situation. Also, offer him samples of insulin if available.

## 2016-06-19 NOTE — Telephone Encounter (Signed)
TCM/Hospital Follow-up call has been completed. Appointment scheduled for 06/20/16 at 11:30 AM w/ PCP.

## 2016-06-20 ENCOUNTER — Encounter: Payer: Self-pay | Admitting: Internal Medicine

## 2016-06-20 ENCOUNTER — Ambulatory Visit (INDEPENDENT_AMBULATORY_CARE_PROVIDER_SITE_OTHER): Payer: Self-pay | Admitting: Internal Medicine

## 2016-06-20 VITALS — BP 130/76 | HR 76 | Temp 98.3°F | Resp 14 | Ht 72.0 in | Wt 279.4 lb

## 2016-06-20 DIAGNOSIS — E1169 Type 2 diabetes mellitus with other specified complication: Secondary | ICD-10-CM

## 2016-06-20 DIAGNOSIS — E785 Hyperlipidemia, unspecified: Secondary | ICD-10-CM

## 2016-06-20 LAB — CBC WITH DIFFERENTIAL/PLATELET
BASOS ABS: 0.1 10*3/uL (ref 0.0–0.1)
Basophils Relative: 0.8 % (ref 0.0–3.0)
Eosinophils Absolute: 0.1 10*3/uL (ref 0.0–0.7)
Eosinophils Relative: 0.7 % (ref 0.0–5.0)
HCT: 26.8 % — ABNORMAL LOW (ref 39.0–52.0)
Hemoglobin: 8.7 g/dL — ABNORMAL LOW (ref 13.0–17.0)
LYMPHS ABS: 4.1 10*3/uL — AB (ref 0.7–4.0)
Lymphocytes Relative: 25.8 % (ref 12.0–46.0)
MCHC: 32.4 g/dL (ref 30.0–36.0)
MCV: 87.8 fl (ref 78.0–100.0)
MONO ABS: 1.8 10*3/uL — AB (ref 0.1–1.0)
Monocytes Relative: 11.4 % (ref 3.0–12.0)
NEUTROS PCT: 61.3 % (ref 43.0–77.0)
Neutro Abs: 9.6 10*3/uL — ABNORMAL HIGH (ref 1.4–7.7)
Platelets: 360 10*3/uL (ref 150.0–400.0)
RBC: 3.05 Mil/uL — AB (ref 4.22–5.81)
RDW: 16 % — ABNORMAL HIGH (ref 11.5–15.5)
WBC: 15.7 10*3/uL — ABNORMAL HIGH (ref 4.0–10.5)

## 2016-06-20 LAB — BASIC METABOLIC PANEL
BUN: 9 mg/dL (ref 6–23)
CALCIUM: 9.4 mg/dL (ref 8.4–10.5)
CO2: 33 mEq/L — ABNORMAL HIGH (ref 19–32)
Chloride: 93 mEq/L — ABNORMAL LOW (ref 96–112)
Creatinine, Ser: 0.92 mg/dL (ref 0.40–1.50)
GFR: 117.42 mL/min (ref >60.00)
GLUCOSE: 106 mg/dL — AB (ref 70–99)
Potassium: 3.6 mEq/L (ref 3.5–5.1)
Sodium: 133 mEq/L — ABNORMAL LOW (ref 135–145)

## 2016-06-20 MED ORDER — ONETOUCH ULTRASOFT LANCETS MISC: 12 refills | Status: DC

## 2016-06-20 MED ORDER — INSULIN NPH (HUMAN) (ISOPHANE) 100 UNIT/ML ~~LOC~~ SUSP: SUBCUTANEOUS | 1 refills | Status: DC

## 2016-06-20 MED ORDER — SITAGLIPTIN PHOS-METFORMIN HCL 50-1000 MG PO TABS: 1.0000 | ORAL_TABLET | Freq: Two times a day (BID) | ORAL | 6 refills | Status: DC

## 2016-06-20 MED ORDER — GLUCOSE BLOOD VI STRP: ORAL_STRIP | 12 refills | Status: DC

## 2016-06-20 NOTE — Progress Notes (Signed)
Pre visit review using our clinic review tool, if applicable. No additional management support is needed unless otherwise documented below in the visit note. 

## 2016-06-20 NOTE — Assessment & Plan Note (Signed)
Diabetes: Previously well-controlled, was admitted with a DKA due to inability to afford medications. Currently on 70/30 insulin 40 units twice a day but won't be able to afford. ( thus on 28 units of NPH BID).  CBG yesterday morning 169, last 250, this morning 81.  He was previously well controlled with oral medications, I think for now given recent DKA he needs to stay on insulin. Risk of death from DKA discuss. Plan: NPH insulin 32 units in the morning and 22 at night Go back on Janumet 50-1,000 twice a day. Coupon provided. He already got DM education of the hospital and is eating healthier. Monitor CBGs, see instructions Hyperlipidemia: Having cramps at the left leg since Lipitor restarted, hold for 2 weeks, then go back. If problems resurface let me know. RTC 2 months

## 2016-06-20 NOTE — Patient Instructions (Addendum)
GO TO THE LAB : Get the blood work     GO TO THE FRONT DESK Schedule your next appointment for a  routine checkup in 2 months   Insulin: 32 units 30 minutes before breakfast and 22 units 30 minutes before dinner  Go back on Janumet: The first week take only one tablet in the mornings, then take 1 tablet twice a day  Hold Lipitor for 2 weeks then go back on it. If the cramps come back ,  stay off Lipitor  . Diabetes: Check your blood sugar twice a day     at different times of the day  GOALS: Fasting before a meal 70- 130 2 hours after a meal less than 180 At bedtime 90-150 Call if consistently not at goal

## 2016-06-20 NOTE — Progress Notes (Signed)
Subjective:    Patient ID: Michael Willis, male    DOB: September 12, 1975, 40 y.o.   MRN: [id number]  DOS:  06/20/2016 Type of visit - description : Hospital follow-up Interval history: Was admitted 06/13/2016 for 3 days. The patient has diabetes, he lost his insurance and has not been able to get his medication. Presented to the ER with abdominal pain, polydipsia, polyuria, malaise and diarrhea. Upon presentation blood sugar was 761, sodium 129. He was DX with DKA and treated accordingly.   Review of Systems Since he left the hospital he is doing well. He is checking his CBGs. No nausea, vomiting, polydypsia or polyuria. He still has some abdominal pain. Also, since he left the hospital is having cramps of the left leg, thinks related to one of the medications  Past Medical History:  Diagnosis Date  . Diabetes (HCC) 01/05/2013  . Elevated LFTs 01/05/2013  . HTN (hypertension) 12/12/2012  . Hyperlipidemia 12/04/2013  . Obesity   . Tinea pedis, onychomycosis 01/05/2013    Past Surgical History:  Procedure Laterality Date  . NO PAST SURGERIES      Social History   Social History  . Marital status: Single    Spouse name: N/A  . Number of children: 5  . 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 Smoking status: Former Smoker    Years: 6.00    Types: Cigars    Quit date: 09/18/2000  . Smokeless tobacco: Never Used     Comment: smoke a few cigars off an on .   Marland Kitchen. Alcohol use Yes     Comment: socially  . Drug use: No  . Sexual activity: Not on file   Other Topics Concern  . Not on file   Social History Narrative   Has a GF, live together .   Exercise: none   Diet: regular        Medication List       Accurate as of 06/20/16  5:10 PM. Always use your most recent med list.          aspirin 81 MG EC tablet Take 1 tablet (81 mg total) by mouth daily.     atorvastatin 40 MG tablet Commonly known as:  LIPITOR Take 1 tablet (40 mg total) by mouth daily.   glucose blood test strip Check blood sugar three times daily   hydrochlorothiazide 12.5 MG capsule Commonly known as:  MICROZIDE Take 1 capsule (12.5 mg total) by mouth daily.   insulin aspart protamine- aspart (70-30) 100 UNIT/ML injection Commonly known as:  NOVOLOG MIX 70/30 Inject 0.4 mLs (40 Units total) into the skin 2 (two) times daily with a meal.   insulin NPH Human 100 UNIT/ML injection Commonly known as:  HUMULIN N,NOVOLIN N 32 units before breakfast, 22 units before dinner   INSULIN SYRINGE .5CC/28G 28G X 1/2" 0.5 ML Misc 1 Syringe by Does not apply route 2 (two) times daily.   lisinopril 5 MG tablet Commonly known as:  PRINIVIL,ZESTRIL Take 1 tablet (5 mg total) by mouth daily.   onetouch ultrasoft lancets Check blood sugar three times daily   sitaGLIPtin-metformin 50-1000 MG tablet Commonly known as:  JANUMET Take 1 tablet by mouth 2 (two) times daily with a meal.          Objective:   Physical Exam BP 130/76 (BP Location: Right Arm, Patient Position: Sitting,  Cuff Size: Normal)   Pulse 76   Temp 98.3 F (36.8 C) (Oral)   Resp 14   Ht 6' (1.829 m)   Wt 279 lb 6 oz (126.7 kg)   BMI 37.89 kg/m  General:   Well developed, well nourished . NAD.  HEENT:  Normocephalic . Face symmetric, atraumatic Lungs:  CTA B Normal respiratory effort, no intercostal retractions, no accessory muscle use. Heart: RRR,  no murmur.  No pretibial edema bilaterally  Skin: Not pale. Not jaundice Neurologic:  alert & oriented X3.  Speech normal, gait appropriate for age and unassisted Psych--  Cognition and judgment appear intact.  Cooperative with normal attention span and concentration.  Behavior appropriate. No anxious or depressed appearing.      Assessment & Plan:   Assessment DM 2014 HTN Hyperlipidemia Elevated LFTs Morbid  obesity Onychomycosis  PLAN: Diabetes: Previously well-controlled, was admitted with a DKA due to inability to afford medications. Currently on 70/30 insulin 40 units twice a day but won't be able to afford. ( thus on 28 units of NPH BID).  CBG yesterday morning 169, last 250, this morning 81.  He was previously well controlled with oral medications, I think for now given recent DKA he needs to stay on insulin. Risk of death from DKA discuss. Plan: NPH insulin 32 units in the morning and 22 at night Go back on Janumet 50-1,000 twice a day. Coupon provided. He already got DM education of the hospital and is eating healthier. Monitor CBGs, see instructions Hyperlipidemia: Having cramps at the left leg since Lipitor restarted, hold for 2 weeks, then go back. If problems resurface let me know. RTC 2 months

## 2016-06-21 ENCOUNTER — Telehealth: Payer: Self-pay | Admitting: Internal Medicine

## 2016-06-21 ENCOUNTER — Telehealth: Payer: Self-pay | Admitting: *Deleted

## 2016-06-21 NOTE — Telephone Encounter (Signed)
Seen yesterday

## 2016-06-21 NOTE — Telephone Encounter (Signed)
Called pt, he reports he is feeling better this morning. No further episodes episodes of dizziness/lightheadedness. Post-prandial CBG this morning 165. CBG 135 last night during episode of dizziness. He took insulin as directed this morning, did not take Janumet. Will restart Janumet tomorrow morning per wife (taking once daily for the first week per PCP). Advised pt to continue medications as directed and to continue checking CBGs at home and let us know if he experiences any further issues or any hypoglycemia and he verbalized understanding.

## 2016-06-21 NOTE — Telephone Encounter (Signed)
[facility] note received via fax  Call:   Date: 06/20/16 Time: 2315   Caller: Annabelle HarmanDana, spouse Return number: [phone]  Nurse: Leanord AsalGail Vandenberg, RN  Chief Complaint: Dizziness   Reason for call: Caller said her husband was seen today, was prescribed new medicine. Was in the shower and felt dizzy and lightheaded. New med is janumet. Also on insulin. bg 123-135.    Related visit to physician within the last 2 weeks: Yes  Guideline: Dizziness-Lightheadedness; Taking a medicine that could cause dizziness (e.g., blood pressure medications, diuretics)  Disposition: See Physician within 24 Hours

## 2016-06-21 NOTE — Telephone Encounter (Signed)
Labs reviewed, electrolytes are essentially normal but he has persistent leukocytosis and has developed anemia. Hemoglobin 8.7. I called the patient, a week ago he had mild abdominal pain which is not results. Denies nausea, vomiting, change in the color of the stools. Not taking NSAIDs. Plan:  Please add iron and ferritin (dx Anemia),  if needed call the patient for redraw. Further advise with results, most likely will need a GI referral.

## 2016-06-22 ENCOUNTER — Other Ambulatory Visit (INDEPENDENT_AMBULATORY_CARE_PROVIDER_SITE_OTHER): Payer: Self-pay

## 2016-06-22 DIAGNOSIS — D649 Anemia, unspecified: Secondary | ICD-10-CM

## 2016-06-22 LAB — IRON: Iron: 52 ug/dL (ref 42–165)

## 2016-06-22 NOTE — Telephone Encounter (Signed)
Add on faxed to main lab.

## 2016-06-22 NOTE — Telephone Encounter (Signed)
Received call from lab, able to add iron but not ferritin.

## 2016-06-25 ENCOUNTER — Telehealth: Payer: Self-pay | Admitting: Internal Medicine

## 2016-06-25 DIAGNOSIS — D649 Anemia, unspecified: Secondary | ICD-10-CM

## 2016-06-25 NOTE — Telephone Encounter (Signed)
LMOM informing Pt to return call.  

## 2016-06-25 NOTE — Telephone Encounter (Signed)
Unable to add ferritin. He has normocytic anemia, leukocytosis, anemia has developed rather quickly. No GI symptoms. Plan: Recheck a CBC. Iron panel, B12, folic acid, reticulocyte count, LFTs GI referral Dx anemia  Call patient and let him know

## 2016-06-27 NOTE — Telephone Encounter (Signed)
Have not received call back from Pt. Please advise.

## 2016-06-27 NOTE — Telephone Encounter (Signed)
Please call one more time, if unable to talk to him, send a letter.

## 2016-06-28 NOTE — Telephone Encounter (Signed)
LMOM informing Pt to return call.  

## 2016-07-02 ENCOUNTER — Encounter: Payer: Self-pay | Admitting: Gastroenterology

## 2016-07-02 NOTE — Telephone Encounter (Signed)
Have been unable to contact Pt. Letter printed and mailed to Pt regarding lab results. Instructed him to call office to schedule lab appt to have further work-up. Orders placed. Also, informed of recommended GI referral.

## 2016-07-04 ENCOUNTER — Telehealth: Payer: Self-pay | Admitting: Internal Medicine

## 2016-07-04 MED ORDER — HYDROCHLOROTHIAZIDE 12.5 MG PO CAPS: 12.5000 mg | ORAL_CAPSULE | Freq: Every day | ORAL | 5 refills | Status: DC

## 2016-07-04 MED ORDER — ATORVASTATIN CALCIUM 40 MG PO TABS: 40.0000 mg | ORAL_TABLET | Freq: Every day | ORAL | 5 refills | Status: DC

## 2016-07-04 MED ORDER — LISINOPRIL 5 MG PO TABS: 5.0000 mg | ORAL_TABLET | Freq: Every day | ORAL | 5 refills | Status: DC

## 2016-07-04 NOTE — Telephone Encounter (Signed)
Caller name: Relationship to patient: Self Can be reached:[phone]  Pharmacy:  [facility] [address] - [place] ([address]), [place] - [address] [phone] ([phone] (Fax)     Reason for call: Patient called stating that he needs ALL of his medications refilled before the Thanksgiving Holiday. Stated that he did not have his medications with him so he was not sure which ones he is out of so he just want to get refills on all of them. Plse adv

## 2016-07-04 NOTE — Telephone Encounter (Signed)
Janumet-refilled 06/20/2016 Humulin-refilled 06/20/2016 Novolog-refilled 06/16/2016 Rx[facility] for Lisinopril, HCTZ, [facility]

## 2016-07-28 ENCOUNTER — Telehealth: Payer: Self-pay | Admitting: Internal Medicine

## 2016-07-28 NOTE — Telephone Encounter (Addendum)
Please check on the patient, see last OC. See how he's doing. -If cost of medication is an issue, we could try to use insulin only regimen.  -I also will consider giving him samples of Lantus or similar medication.

## 2016-07-29 ENCOUNTER — Other Ambulatory Visit: Payer: Self-pay | Admitting: Internal Medicine

## 2016-07-30 MED ORDER — INSULIN NPH (HUMAN) (ISOPHANE) 100 UNIT/ML ~~LOC~~ SUSP: SUBCUTANEOUS | 1 refills | Status: DC

## 2016-07-30 NOTE — Telephone Encounter (Signed)
Seems  like things are going well. Okay to refill insulin. Recommend to go ahead and increase Janumet to twice a day. Follow-up next month as planned

## 2016-07-30 NOTE — Telephone Encounter (Signed)
Pt states he is doing well, no issues affording meds currently, but he appreciates anything PCP can do to cut costs. Fasting CBGs averaging 90s-100s, post-prandial CBGs averaging 100s-140s. He is still taking Janumet once daily. Please advise if he should increase to BID.  He has not restarted lipitor and has had no further leg cramping.  He requested refill of NPH insulin-medication filled to pharmacy as requested.  If any further instructions/changes, I will call the pt, otherwise he plans to follow-up in January as scheduled.

## 2016-07-30 NOTE — Telephone Encounter (Signed)
Rx denied, instructed pharmacy to have Pt contact office first. Pt due for repeat labs.

## 2016-07-30 NOTE — Telephone Encounter (Signed)
Pt notified of instructions and verbalized understanding. 

## 2016-08-29 ENCOUNTER — Ambulatory Visit: Payer: Self-pay | Admitting: Gastroenterology

## 2016-08-29 ENCOUNTER — Encounter: Payer: Self-pay | Admitting: Internal Medicine

## 2016-08-29 ENCOUNTER — Other Ambulatory Visit: Payer: Self-pay

## 2016-08-29 ENCOUNTER — Ambulatory Visit (INDEPENDENT_AMBULATORY_CARE_PROVIDER_SITE_OTHER): Payer: Self-pay | Admitting: Internal Medicine

## 2016-08-29 VITALS — BP 134/80 | HR 71 | Temp 98.1°F | Resp 14 | Ht 72.0 in | Wt 300.4 lb

## 2016-08-29 DIAGNOSIS — R945 Abnormal results of liver function studies: Secondary | ICD-10-CM

## 2016-08-29 DIAGNOSIS — R7989 Other specified abnormal findings of blood chemistry: Secondary | ICD-10-CM

## 2016-08-29 DIAGNOSIS — E1310 Other specified diabetes mellitus with ketoacidosis without coma: Secondary | ICD-10-CM

## 2016-08-29 DIAGNOSIS — D649 Anemia, unspecified: Secondary | ICD-10-CM

## 2016-08-29 DIAGNOSIS — E119 Type 2 diabetes mellitus without complications: Secondary | ICD-10-CM

## 2016-08-29 DIAGNOSIS — E1110 Type 2 diabetes mellitus with ketoacidosis without coma: Secondary | ICD-10-CM

## 2016-08-29 DIAGNOSIS — E785 Hyperlipidemia, unspecified: Secondary | ICD-10-CM

## 2016-08-29 DIAGNOSIS — I10 Essential (primary) hypertension: Secondary | ICD-10-CM

## 2016-08-29 LAB — VITAMIN B12: Vitamin B-12: 360 pg/mL (ref 211–911)

## 2016-08-29 LAB — CBC WITH DIFFERENTIAL/PLATELET
Basophils Absolute: 0.1 10*3/uL (ref 0.0–0.1)
Basophils Relative: 0.5 % (ref 0.0–3.0)
EOS ABS: 0.2 10*3/uL (ref 0.0–0.7)
EOS PCT: 1.9 % (ref 0.0–5.0)
HCT: 44 % (ref 39.0–52.0)
HEMOGLOBIN: 14.8 g/dL (ref 13.0–17.0)
LYMPHS ABS: 3.2 10*3/uL (ref 0.7–4.0)
Lymphocytes Relative: 27.4 % (ref 12.0–46.0)
MCHC: 33.6 g/dL (ref 30.0–36.0)
MCV: 83.9 fl (ref 78.0–100.0)
MONO ABS: 0.7 10*3/uL (ref 0.1–1.0)
Monocytes Relative: 6.2 % (ref 3.0–12.0)
NEUTROS ABS: 7.5 10*3/uL (ref 1.4–7.7)
NEUTROS PCT: 64 % (ref 43.0–77.0)
PLATELETS: 238 10*3/uL (ref 150.0–400.0)
RBC: 5.24 Mil/uL (ref 4.22–5.81)
RDW: 13.9 % (ref 11.5–15.5)
WBC: 11.7 10*3/uL — ABNORMAL HIGH (ref 4.0–10.5)

## 2016-08-29 LAB — BASIC METABOLIC PANEL
BUN: 17 mg/dL (ref 6–23)
CHLORIDE: 103 meq/L (ref 96–112)
CO2: 28 mEq/L (ref 19–32)
Calcium: 10.1 mg/dL (ref 8.4–10.5)
Creatinine, Ser: 0.96 mg/dL (ref 0.40–1.50)
GFR: 111.68 mL/min (ref >60.00)
GLUCOSE: 128 mg/dL — AB (ref 70–99)
POTASSIUM: 3.9 meq/L (ref 3.5–5.1)
SODIUM: 140 meq/L (ref 135–145)

## 2016-08-29 LAB — HEMOGLOBIN A1C: Hgb A1c MFr Bld: 5.5 % (ref 4.6–6.5)

## 2016-08-29 LAB — FOLATE: Folate: 11.9 ng/mL (ref >5.9)

## 2016-08-29 LAB — IRON: IRON: 78 ug/dL (ref 42–165)

## 2016-08-29 LAB — RETICULOCYTES
ABS Retic: 82880 cells/uL (ref 25000–90000)
RBC.: 5.18 MIL/uL (ref 4.20–5.80)
Retic Ct Pct: 1.6 %

## 2016-08-29 LAB — FERRITIN: FERRITIN: 183.8 ng/mL (ref 22.0–322.0)

## 2016-08-29 NOTE — Progress Notes (Signed)
Subjective:    Patient ID: Michael Willis, male    DOB: November 25, 1975, 41 y.o.   MRN: [id number]  DOS:  08/29/2016 Type of visit - description :  Follow-up Interval history: DM: Taking NPH as prescribed, on janumet , only one tablet daily. Blood sugars range between 90 and 120. He ran out of Janumet 3 days ago, blood sugar yesterday was 218. Afraid of taking Janumet twice a day, see HPI.  High cholesterol: had leg cramps, he held Lipitor and  felt better. Not taking any statin at this point HTN: Good med compliance, BP today is very good Anemia: Due for labs.  Wt Readings from Last 3 Encounters:  08/29/16 (!) 300 lb 6 oz (136.2 kg)  06/20/16 279 lb 6 oz (126.7 kg)  06/14/16 267 lb 13.7 oz (121.5 kg)     Review of Systems Denies nausea, vomiting, diarrhea. No blood in the stools or stomach pain No chest pain or difficulty breathing No lower extremity paresthesias  Past Medical History:  Diagnosis Date  . Diabetes (HCC) 01/05/2013  . Elevated LFTs 01/05/2013  . HTN (hypertension) 12/12/2012  . Hyperlipidemia 12/04/2013  . Obesity   . Tinea pedis, onychomycosis 01/05/2013    Past Surgical History:  Procedure Laterality Date  . NO PAST SURGERIES      Social History   Social History  . Marital status: Single    Spouse name: N/A  . Number of children: 5  . Years of education: N/A   Occupational History  . not working     Social History Main Topics  . Smoking status: Former Smoker    Years: 6.00    Types: Cigars    Quit date: 09/18/2000  . Smokeless tobacco: Never Used     Comment: smoke a few cigars off an on .   Marland Kitchen. Alcohol use Yes     Comment: socially  . Drug use: No  . Sexual activity: Not on file   Other Topics Concern  . Not on file   Social History Narrative   Has a GF, live together .   Exercise: none   Diet: regular      Allergies as of 08/29/2016   No Known Allergies     Medication List       Accurate as of 08/29/16  1:04 PM. Always use your  most recent med list.          aspirin 81 MG EC tablet Take 1 tablet (81 mg total) by mouth daily.   glucose blood test strip Check blood sugar three times daily   hydrochlorothiazide 12.5 MG capsule Commonly known as:  MICROZIDE Take 1 capsule (12.5 mg total) by mouth daily.   insulin NPH Human 100 UNIT/ML injection Commonly known as:  HUMULIN N,NOVOLIN N 32 units before breakfast, 22 units before dinner   INSULIN SYRINGE .5CC/28G 28G X 1/2" 0.5 ML Misc 1 Syringe by Does not apply route 2 (two) times daily.   lisinopril 5 MG tablet Commonly known as:  PRINIVIL,ZESTRIL Take 1 tablet (5 mg total) by mouth daily.   onetouch ultrasoft lancets Check blood sugar three times daily   sitaGLIPtin-metformin 50-1000 MG tablet Commonly known as:  JANUMET Take 1 tablet by mouth 2 (two) times daily with a meal.          Objective:   Physical Exam BP 134/80 (BP Location: Right Arm, Patient Position: Sitting, Cuff Size: Normal)   Pulse 71   Temp 98.1 F (36.7 C) (  Oral)   Resp 14   Ht 6' (1.829 m)   Wt (!) 300 lb 6 oz (136.2 kg)   SpO2 96%   BMI 40.74 kg/m  General:   Well developed, well nourished . NAD.  HEENT:  Normocephalic . Face symmetric, atraumatic Lungs:  CTA B Normal respiratory effort, no intercostal retractions, no accessory muscle use. Heart: RRR,  no murmur.  No pretibial edema bilaterally  DIABETIC FEET EXAM: No lower extremity edema Normal pedal pulses bilaterally Dystrophic nails Pinprick examination of the feet normal. Neurologic:  alert & oriented X3.  Speech normal, gait appropriate for age and unassisted Psych--  Cognition and judgment appear intact.  Cooperative with normal attention span and concentration.  Behavior appropriate. No anxious or depressed appearing.      Assessment & Plan:    Assessment DM 2014 HTN Hyperlipidemia Elevated LFTs Morbid obesity Onychomycosis  PLAN: DM: Currently on NPH 32 units a.m., 22 units p.m.  , takes Janumet only one tablet daily, afraid of taking Janumet twice a day b/c had a near syncope one time. CBGs ranged from 90-120. Foot exam normal. Plan: Continue NPH, increase Janumet to twice a day (pending labs) , check A1c HTN: Continue lisinopril and HCTZ. Check a BMP High cholesterol: Currently not taking Lipitor due to cramps. Recheck FLP on RTC Anemia: After last admission, Hg drop further to 8.7. Etiology unclear, rechecking labs today, has an appointment to see GI. Declined a flu shot RTC 3 months

## 2016-08-29 NOTE — Progress Notes (Signed)
Pre visit review using our clinic review tool, if applicable. No additional management support is needed unless otherwise documented below in the visit note. 

## 2016-08-29 NOTE — Patient Instructions (Signed)
GO TO THE LAB : Get the blood work     GO TO THE FRONT DESK Schedule your next appointment for a  checkup in 3 months  

## 2016-08-29 NOTE — Assessment & Plan Note (Addendum)
DM: Currently on NPH 32 units a.m., 22 units p.m. , takes Janumet only one tablet daily, afraid of taking Janumet twice a day b/c had a near syncope one time. CBGs ranged from 90-120. Foot exam normal. Plan: Continue NPH, increase Janumet to twice a day (pending labs) , check A1c HTN: Continue lisinopril and HCTZ. Check a BMP High cholesterol: Currently not taking Lipitor due to cramps. Recheck FLP on RTC Anemia: After last admission, Hg drop further to 8.7. Etiology unclear, rechecking labs today, has an appointment to see GI. Declined a flu shot RTC 3 months

## 2016-09-03 ENCOUNTER — Encounter: Payer: Self-pay | Admitting: Internal Medicine

## 2016-09-04 NOTE — Addendum Note (Signed)
Addended byConrad : Autymn Omlor D on: 09/04/2016 04:32 PM   Modules accepted: Orders

## 2016-10-08 ENCOUNTER — Telehealth: Payer: Self-pay | Admitting: Internal Medicine

## 2016-10-08 MED ORDER — GLUCOSE BLOOD VI STRP: ORAL_STRIP | 12 refills | Status: DC

## 2016-10-08 NOTE — Telephone Encounter (Signed)
Caller name: Relationship to patient: Self Can be reached: [phone] Pharmacy:  [facility] [address] - [place] ([address]), [place] - [address] [phone] (Phone) [phone] (Fax)     Reason for call: Patient needs Rx for One Touch Verio test strips

## 2016-10-08 NOTE — Telephone Encounter (Signed)
Rx sent 

## 2016-11-29 ENCOUNTER — Ambulatory Visit (INDEPENDENT_AMBULATORY_CARE_PROVIDER_SITE_OTHER): Payer: BLUE CROSS/BLUE SHIELD | Admitting: Internal Medicine

## 2016-11-29 ENCOUNTER — Encounter: Payer: Self-pay | Admitting: Internal Medicine

## 2016-11-29 VITALS — BP 128/80 | HR 77 | Temp 98.0°F | Resp 14 | Ht 72.0 in | Wt 311.5 lb

## 2016-11-29 DIAGNOSIS — I10 Essential (primary) hypertension: Secondary | ICD-10-CM

## 2016-11-29 DIAGNOSIS — Z114 Encounter for screening for human immunodeficiency virus [HIV]: Secondary | ICD-10-CM

## 2016-11-29 DIAGNOSIS — E785 Hyperlipidemia, unspecified: Secondary | ICD-10-CM

## 2016-11-29 DIAGNOSIS — E1310 Other specified diabetes mellitus with ketoacidosis without coma: Secondary | ICD-10-CM

## 2016-11-29 DIAGNOSIS — E1110 Type 2 diabetes mellitus with ketoacidosis without coma: Secondary | ICD-10-CM

## 2016-11-29 DIAGNOSIS — Z0000 Encounter for general adult medical examination without abnormal findings: Secondary | ICD-10-CM

## 2016-11-29 LAB — LIPID PANEL
CHOL/HDL RATIO: 5
Cholesterol: 248 mg/dL — ABNORMAL HIGH (ref 0–200)
HDL: 49.2 mg/dL (ref >39.00)
LDL Cholesterol: 160 mg/dL — ABNORMAL HIGH (ref 0–99)
NONHDL: 198.32
TRIGLYCERIDES: 192 mg/dL — AB (ref 0.0–149.0)
VLDL: 38.4 mg/dL (ref 0.0–40.0)

## 2016-11-29 LAB — IRON: Iron: 74 ug/dL (ref 42–165)

## 2016-11-29 LAB — CBC WITH DIFFERENTIAL/PLATELET
Basophils Absolute: 0.1 10*3/uL (ref 0.0–0.1)
Basophils Relative: 0.7 % (ref 0.0–3.0)
Eosinophils Absolute: 0.2 10*3/uL (ref 0.0–0.7)
Eosinophils Relative: 1.4 % (ref 0.0–5.0)
HCT: 44.3 % (ref 39.0–52.0)
Hemoglobin: 14.6 g/dL (ref 13.0–17.0)
Lymphocytes Relative: 25 % (ref 12.0–46.0)
Lymphs Abs: 3.2 10*3/uL (ref 0.7–4.0)
MCHC: 32.9 g/dL (ref 30.0–36.0)
MCV: 88.2 fl (ref 78.0–100.0)
Monocytes Absolute: 0.9 10*3/uL (ref 0.1–1.0)
Monocytes Relative: 6.8 % (ref 3.0–12.0)
Neutro Abs: 8.4 10*3/uL — ABNORMAL HIGH (ref 1.4–7.7)
Neutrophils Relative %: 66.1 % (ref 43.0–77.0)
Platelets: 216 10*3/uL (ref 150.0–400.0)
RBC: 5.03 Mil/uL (ref 4.22–5.81)
RDW: 13.2 % (ref 11.5–15.5)
WBC: 12.8 10*3/uL — ABNORMAL HIGH (ref 4.0–10.5)

## 2016-11-29 LAB — COMPREHENSIVE METABOLIC PANEL
ALT: 44 U/L (ref 0–53)
AST: 31 U/L (ref 0–37)
Albumin: 4.2 g/dL (ref 3.5–5.2)
Alkaline Phosphatase: 37 U/L — ABNORMAL LOW (ref 39–117)
BILIRUBIN TOTAL: 0.6 mg/dL (ref 0.2–1.2)
BUN: 14 mg/dL (ref 6–23)
CALCIUM: 9.8 mg/dL (ref 8.4–10.5)
CO2: 31 meq/L (ref 19–32)
Chloride: 103 mEq/L (ref 96–112)
Creatinine, Ser: 1.11 mg/dL (ref 0.40–1.50)
GFR: 94.33 mL/min (ref >60.00)
GLUCOSE: 110 mg/dL — AB (ref 70–99)
POTASSIUM: 3.7 meq/L (ref 3.5–5.1)
Sodium: 140 mEq/L (ref 135–145)
Total Protein: 7.5 g/dL (ref 6.0–8.3)

## 2016-11-29 LAB — FERRITIN: Ferritin: 175.2 ng/mL (ref 22.0–322.0)

## 2016-11-29 LAB — TSH: TSH: 1.23 u[IU]/mL (ref 0.35–4.50)

## 2016-11-29 LAB — HIV ANTIBODY (ROUTINE TESTING W REFLEX): HIV: NONREACTIVE

## 2016-11-29 LAB — HEMOGLOBIN A1C: Hgb A1c MFr Bld: 5.2 % (ref 4.6–6.5)

## 2016-11-29 MED ORDER — GLUCOSE BLOOD VI STRP: ORAL_STRIP | 12 refills | Status: DC

## 2016-11-29 NOTE — Patient Instructions (Addendum)
GO TO THE LAB : Get the blood work     GO TO THE FRONT DESK Schedule your next appointment for a  routine checkup in 4-5 months   Check the  blood pressure 2 or 3 times a month Be sure your blood pressure is between 110/65 and  135/85. If it is consistently higher or lower, let me know  Diabetes: Check your blood sugar  once a day   at different times of the day GOALS: Fasting before a meal 70- 130 2 hours after a meal less than 180 At bedtime 90-150 Call if consistently not at goal

## 2016-11-29 NOTE — Progress Notes (Signed)
Pre visit review using our clinic review tool, if applicable. No additional management support is needed unless otherwise documented below in the visit note. 

## 2016-11-29 NOTE — Assessment & Plan Note (Addendum)
Td 2014; pnm 23: Declined, explained the benefits --CCS: not indicated , no FH --prostate ca screening: not indicated, no FH --Discussed diet and exercise --Labs: CMP, FLP, CBC, A1c, TSH, iron, ferritin, HIV

## 2016-11-29 NOTE — Progress Notes (Signed)
Subjective:    Patient ID: Michael Willis, male    DOB: July 09, 1976, 41 y.o.   MRN: [id number]  DOS:  11/29/2016 Type of visit - description : cpx Interval history: no concerns  Wt Readings from Last 3 Encounters:  11/29/16 (!) 311 lb 8 oz (141.3 kg)  08/29/16 (!) 300 lb 6 oz (136.2 kg)  06/20/16 279 lb 6 oz (126.7 kg)     Review of Systems   A 14 point review of systems is negative    Past Medical History:  Diagnosis Date  . Diabetes (HCC) 01/05/2013  . Elevated LFTs 01/05/2013  . HTN (hypertension) 12/12/2012  . Hyperlipidemia 12/04/2013  . Obesity   . Tinea pedis, onychomycosis 01/05/2013    Past Surgical History:  Procedure Laterality Date  . NO PAST SURGERIES      Social History   Social History  . Marital status: Single    Spouse name: N/A  . Number of children: 5  . Years of education: N/A   Occupational History  . not working     Social History Main Topics  . Smoking status: Former Smoker    Years: 6.00    Types: Cigars    Quit date: 09/18/2000  . Smokeless tobacco: Never Used     Comment: smoke a few cigars off an on .   Marland Kitchen Alcohol use Yes     Comment: socially  . Drug use: No  . Sexual activity: Not on file   Other Topics Concern  . Not on file   Social History Narrative   Has a GF, live together .   Exercise: none   Diet: regular     Family History  Problem Relation Age of Onset  . CAD Mother 76    MI?  . Diabetes Neg Hx   . Stroke Neg Hx   . Colon cancer Neg Hx   . Prostate cancer Neg Hx     Allergies as of 11/29/2016   No Known Allergies     Medication List       Accurate as of 11/29/16  8:31 AM. Always use your most recent med list.          aspirin 81 MG EC tablet Take 1 tablet (81 mg total) by mouth daily.   glucose blood test strip Check blood sugar three times daily   hydrochlorothiazide 12.5 MG capsule Commonly known as:  MICROZIDE Take 1 capsule (12.5 mg total) by mouth daily.   insulin NPH Human 100 UNIT/ML  injection Commonly known as:  HUMULIN N,NOVOLIN N 25 units before breakfast, 15 units before dinner   INSULIN SYRINGE .5CC/28G 28G X 1/2" 0.5 ML Misc 1 Syringe by Does not apply route 2 (two) times daily.   lisinopril 5 MG tablet Commonly known as:  PRINIVIL,ZESTRIL Take 1 tablet (5 mg total) by mouth daily.   onetouch ultrasoft lancets Check blood sugar three times daily   sitaGLIPtin-metformin 50-1000 MG tablet Commonly known as:  JANUMET Take 1 tablet by mouth 2 (two) times daily with a meal.          Objective:   Physical Exam BP 128/80 (BP Location: Left Arm, Patient Position: Sitting, Cuff Size: Normal)   Pulse 77   Temp 98 F (36.7 C) (Oral)   Resp 14   Ht 6' (1.829 m)   Wt (!) 311 lb 8 oz (141.3 kg)   SpO2 98%   BMI 42.25 kg/m  General:   Well developed, morbidly  obese, NAD Neck: No  thyromegaly  HEENT:  Normocephalic . Face symmetric, atraumatic Lungs:  CTA B Normal respiratory effort, no intercostal retractions, no accessory muscle use. Heart: RRR,  no murmur.  No pretibial edema bilaterally  Abdomen:  Not distended, soft, non-tender. No rebound or rigidity.   Skin: Exposed areas without rash. Not pale. Not jaundice Neurologic:  alert & oriented X3.  Speech normal, gait appropriate for age and unassisted Strength symmetric and appropriate for age.  Psych: Cognition and judgment appear intact.  Cooperative with normal attention span and concentration.  Behavior appropriate. No anxious or depressed appearing.     Assessment & Plan:    Assessment DM 2014 HTN Hyperlipidemia Elevated LFTs Morbid obesity Onychomycosis  PLAN: DM: Last A1c 5.5, insulin decreased; current dose--->25 and 15 units. Takes Janumet every morning and sometimes in the afternoon. Will recheck a A1c, further advise with results. We are refilling the glucometer strips. Patient currently is not checking CBGs HTN: Continue lisinopril, HCTZ, rechecking labs. No ambulatory  BPs, BP today is very good. Hyperlipidemia: Diet control, check a FLP Morbid obesity: Refer to a nutritionist--declined . Has gained significant amount of weight lately. Anemia: Last year had transient anemia during an admission, etiology was unclear (dilutional?) but self resolved. Will recheck labs. RTC 4-5 months

## 2016-11-30 NOTE — Assessment & Plan Note (Signed)
DM: Last A1c 5.5, insulin decreased; current dose--->25 and 15 units. Takes Janumet every morning and sometimes in the afternoon. Will recheck a A1c, further advise with results. We are refilling the glucometer strips. Patient currently is not checking CBGs HTN: Continue lisinopril, HCTZ, rechecking labs. No ambulatory BPs, BP today is very good. Hyperlipidemia: Diet control, check a FLP Morbid obesity: Refer to a nutritionist--declined . Has gained significant amount of weight lately. Anemia: Last year had transient anemia during an admission, etiology was unclear (dilutional?) but self resolved. Will recheck labs. RTC 4-5 months

## 2016-12-03 MED ORDER — ATORVASTATIN CALCIUM 20 MG PO TABS: 20.0000 mg | ORAL_TABLET | Freq: Every day | ORAL | 3 refills | Status: DC

## 2016-12-03 NOTE — Addendum Note (Signed)
Addended byConrad [place] D on: 12/03/2016 01:33 PM   Modules accepted: Orders

## 2016-12-29 ENCOUNTER — Other Ambulatory Visit: Payer: Self-pay | Admitting: Internal Medicine

## 2017-02-12 ENCOUNTER — Telehealth: Payer: Self-pay | Admitting: Internal Medicine

## 2017-02-12 MED ORDER — SCOPOLAMINE 1 MG/3DAYS TD PT72: 1.0000 | MEDICATED_PATCH | TRANSDERMAL | 0 refills | Status: DC

## 2017-02-12 NOTE — Telephone Encounter (Signed)
We'll send scopolamine patch prescription, recommend to try ahead of his trip to be sure he does not have any side effects.

## 2017-02-12 NOTE — Telephone Encounter (Signed)
Spoke w/ Pt, informed that [employer] letter has been printed and placed at front desk for pick up. In regards to scopolamine patches Pt is going on 5 day cruise, he has never used patches before. Informed him I would send message to PCP.

## 2017-02-12 NOTE — Telephone Encounter (Signed)
Please advise 

## 2017-02-12 NOTE — Telephone Encounter (Signed)
Spoke w/ Pt, [facility], instructed him on use of medication and to try med several days before trip to make sure he doesn't have side effects. Pt verbalized understanding.

## 2017-02-12 NOTE — Telephone Encounter (Signed)
Caller name: Relationship to patient:Self Can be reached: [phone] [facility]acy:  [facility] [address] - [place] ([address]), [place] [address] [phone] (Phone) [phone])     Reason for call: Patient request a Rx for motion patches for sea sickness. Also request a letter from provider stating he is diabetic so that he can get access to free workouts at the [facility] [employer] [facility]

## 2017-02-12 NOTE — Telephone Encounter (Signed)
Please call  the patient:  -is he asking for the patch because he is going on a cruise or is because he is sick?.  -Has he ever uses the scopolamine patch before?. As far as the letter: To Whom It May Concern, Mr. Michael Willis is a patient of mine, he isb diabetic and  uses insulin.

## 2017-02-16 ENCOUNTER — Telehealth: Payer: Self-pay | Admitting: Internal Medicine

## 2017-02-16 NOTE — Telephone Encounter (Signed)
Call  patient, was recommended Lipitor 2 months ago, if he decided to take it needs an AST, ALT, FLP.

## 2017-02-18 NOTE — Telephone Encounter (Signed)
LMOM requesting call back

## 2017-02-19 NOTE — Telephone Encounter (Signed)
Letter printed and mailed to Pt to call office to schedule lab appt, fasting.

## 2017-05-01 ENCOUNTER — Telehealth: Payer: Self-pay | Admitting: Internal Medicine

## 2017-05-01 ENCOUNTER — Ambulatory Visit: Payer: BLUE CROSS/BLUE SHIELD | Admitting: Internal Medicine

## 2017-05-01 NOTE — Telephone Encounter (Signed)
Patient states he apologizes he missed he's 6:30am follow up appointment today, [facility], charge or no charge

## 2017-05-01 NOTE — Telephone Encounter (Signed)
No charge. 

## 2017-05-09 ENCOUNTER — Ambulatory Visit: Payer: BLUE CROSS/BLUE SHIELD | Admitting: Internal Medicine

## 2017-05-21 ENCOUNTER — Ambulatory Visit (INDEPENDENT_AMBULATORY_CARE_PROVIDER_SITE_OTHER): Payer: BLUE CROSS/BLUE SHIELD | Admitting: Internal Medicine

## 2017-05-21 ENCOUNTER — Encounter: Payer: Self-pay | Admitting: Internal Medicine

## 2017-05-21 VITALS — BP 134/78 | HR 86 | Temp 97.6°F | Resp 14 | Ht 72.0 in | Wt 322.2 lb

## 2017-05-21 DIAGNOSIS — E1110 Type 2 diabetes mellitus with ketoacidosis without coma: Secondary | ICD-10-CM

## 2017-05-21 DIAGNOSIS — E785 Hyperlipidemia, unspecified: Secondary | ICD-10-CM | POA: Diagnosis not present

## 2017-05-21 LAB — LDL CHOLESTEROL, DIRECT: Direct LDL: 139 mg/dL

## 2017-05-21 LAB — LIPID PANEL
CHOLESTEROL: 239 mg/dL — AB (ref 0–200)
HDL: 36.2 mg/dL — AB (ref >39.00)
NonHDL: 202.59
Total CHOL/HDL Ratio: 7
Triglycerides: 264 mg/dL — ABNORMAL HIGH (ref 0.0–149.0)
VLDL: 52.8 mg/dL — AB (ref 0.0–40.0)

## 2017-05-21 LAB — AST: AST: 31 U/L (ref 0–37)

## 2017-05-21 LAB — ALT: ALT: 56 U/L — AB (ref 0–53)

## 2017-05-21 LAB — HEMOGLOBIN A1C: HEMOGLOBIN A1C: 6.5 % (ref 4.6–6.5)

## 2017-05-21 NOTE — Progress Notes (Signed)
Pre visit review using our clinic review tool, if applicable. No additional management support is needed unless otherwise documented below in the visit note. 

## 2017-05-21 NOTE — Patient Instructions (Signed)
GO TO THE LAB : Get the blood work     GO TO THE FRONT DESK Schedule your next appointment for a  Check up in 3 months  

## 2017-05-21 NOTE — Progress Notes (Signed)
Subjective:    Patient ID: Michael Willis, male    DOB: 25-Apr-1976, 41 y.o.   MRN: [id number]  DOS:  05/21/2017 Type of visit - description : Routine visit Interval history: DM: Continue with the insulin, taking 25 units and 15 units. Ambulatory CBGs "normal", he couldn't tell me any readings. High cholesterol: Was recommended to start Lipitor, states he does not know if he takes it. "My wife arrange all the medications for me".  Review of Systems Denies any symptoms consistent with low sugar, no dizziness, fatigue, sweats, etc.    Past Medical History:  Diagnosis Date  . Diabetes (HCC) 01/05/2013  . Elevated LFTs 01/05/2013  . HTN (hypertension) 12/12/2012  . Hyperlipidemia 12/04/2013  . Obesity   . Tinea pedis, onychomycosis 01/05/2013    Past Surgical History:  Procedure Laterality Date  . NO PAST SURGERIES      Social History   Social History  . Marital status: Single    Spouse name: N/A  . Number of children: 5  . Years of education: N/A   Occupational History  . not working     Social History Main Topics  . Smoking status: Former Smoker    Years: 6.00    Types: Cigars    Quit date: 09/18/2000  . Smokeless tobacco: Never Used     Comment: smoke a few cigars off an on .   Marland Kitchen Alcohol use Yes     Comment: socially  . Drug use: No  . Sexual activity: Not on file   Other Topics Concern  . Not on file   Social History Narrative   Has a GF, live together .   Exercise: none   Diet: regular      Allergies as of 05/21/2017   No Known Allergies     Medication List       Accurate as of 05/21/17 11:59 PM. Always use your most recent med list.          aspirin 81 MG EC tablet Take 1 tablet (81 mg total) by mouth daily.   atorvastatin 20 MG tablet Commonly known as:  LIPITOR Take 1 tablet (20 mg total) by mouth at bedtime.   glucose blood test strip Check blood sugar three times daily   hydrochlorothiazide 12.5 MG capsule Commonly known as:   MICROZIDE Take 1 capsule (12.5 mg total) by mouth daily.   insulin NPH Human 100 UNIT/ML injection Commonly known as:  HUMULIN N,NOVOLIN N 25 units before breakfast, 15 units before dinner   INSULIN SYRINGE .5CC/28G 28G X 1/2" 0.5 ML Misc 1 Syringe by Does not apply route 2 (two) times daily.   lisinopril 5 MG tablet Commonly known as:  PRINIVIL,ZESTRIL Take 1 tablet (5 mg total) by mouth daily.   onetouch ultrasoft lancets Check blood sugar three times daily   sitaGLIPtin-metformin 50-1000 MG tablet Commonly known as:  JANUMET Take 1 tablet by mouth 2 (two) times daily with a meal.          Objective:   Physical Exam BP 134/78 (BP Location: Left Arm, Patient Position: Sitting, Cuff Size: Normal)   Pulse 86   Temp 97.6 F (36.4 C) (Oral)   Resp 14   Ht 6' (1.829 m)   Wt (!) 322 lb 4 oz (146.2 kg)   SpO2 98%   BMI 43.70 kg/m  General:   Well developed, well nourished . NAD.  HEENT:  Normocephalic . Face symmetric, atraumatic Lungs:  CTA B Normal  respiratory effort, no intercostal retractions, no accessory muscle use. Heart: RRR,  no murmur.  No pretibial edema bilaterally  Skin: Not pale. Not jaundice Neurologic:  alert & oriented X3.  Speech normal, gait appropriate for age and unassisted Psych--  Cognition and judgment appear intact.  Cooperative with normal attention span and concentration.  Behavior appropriate. No anxious or depressed appearing.      Assessment & Plan:    Assessment DM 2014 HTN Hyperlipidemia Elevated LFTs Morbid obesity Onychomycosis  PLAN: DM: Over controlled per last A1c , as advised to decrease insulin to 15 units and 7 units but  he still takes 25 units and 15 units. He also takes janumet  twice a day. Denies hypoglycemic symptoms. Plan: Check A1c, adjust medications if needed, he will need a insulin pen when we RF his meds High cholesterol: Was prescribed Lipitor based on last cholesterol panel, patient does not know if  he is taking lipitor. Will check labs, further advise with results Declined a flu shot. RTC 3 months

## 2017-05-22 NOTE — Assessment & Plan Note (Signed)
PLAN: DM: Over controlled per last A1c , as advised to decrease insulin to 15 units and 7 units but  he still takes 25 units and 15 units. He also takes janumet  twice a day. Denies hypoglycemic symptoms. Plan: Check A1c, adjust medications if needed, he will need a insulin pen when we RF his meds High cholesterol: Was prescribed Lipitor based on last cholesterol panel, patient does not know if he is taking lipitor. Will check labs, further advise with results Declined a flu shot. RTC 3 months

## 2017-05-24 MED ORDER — ATORVASTATIN CALCIUM 20 MG PO TABS: 20.0000 mg | ORAL_TABLET | Freq: Every day | ORAL | 3 refills | Status: DC

## 2017-05-24 NOTE — Addendum Note (Signed)
Addended byConrad [place]ax[place] D on: 05/24/2017 02:21 PM   Modules accepted: Orders

## 2017-06-08 ENCOUNTER — Other Ambulatory Visit: Payer: Self-pay | Admitting: Internal Medicine

## 2017-07-08 ENCOUNTER — Other Ambulatory Visit: Payer: Self-pay | Admitting: Internal Medicine

## 2017-08-22 ENCOUNTER — Other Ambulatory Visit: Payer: Self-pay | Admitting: Internal Medicine

## 2017-08-22 NOTE — Telephone Encounter (Signed)
Copied from CRM [id number]. Topic: Quick Communication - Rx Refill/Question >> Aug 22, 2017  5:24 PM [name], Michael B wrote: Pt called to get ALL his current Rx's filled, contact pt to advise or contact pharmacy

## 2017-08-23 ENCOUNTER — Telehealth: Payer: Self-pay | Admitting: Internal Medicine

## 2017-08-23 MED ORDER — CYCLOBENZAPRINE HCL 10 MG PO TABS: 10.0000 mg | ORAL_TABLET | Freq: Every evening | ORAL | 0 refills | Status: DC | PRN

## 2017-08-23 MED ORDER — LISINOPRIL 5 MG PO TABS: 5.0000 mg | ORAL_TABLET | Freq: Every day | ORAL | 1 refills | Status: DC

## 2017-08-23 MED ORDER — INSULIN NPH (HUMAN) (ISOPHANE) 100 UNIT/ML ~~LOC~~ SUSP: SUBCUTANEOUS | 1 refills | Status: DC

## 2017-08-23 MED ORDER — HYDROCHLOROTHIAZIDE 12.5 MG PO CAPS: 12.5000 mg | ORAL_CAPSULE | Freq: Every day | ORAL | 1 refills | Status: DC

## 2017-08-23 MED ORDER — SITAGLIPTIN PHOS-METFORMIN HCL 50-1000 MG PO TABS: 1.0000 | ORAL_TABLET | Freq: Two times a day (BID) | ORAL | 1 refills | Status: DC

## 2017-08-23 MED ORDER — ATORVASTATIN CALCIUM 20 MG PO TABS: 20.0000 mg | ORAL_TABLET | Freq: Every day | ORAL | 1 refills | Status: DC

## 2017-08-23 NOTE — Telephone Encounter (Signed)
Pt called- having muscle pain above his knee for several days. [facility], he does have 3 month follow-up scheduled for 09/03/2017.

## 2017-08-23 NOTE — Telephone Encounter (Signed)
Rxs sent

## 2017-08-23 NOTE — Telephone Encounter (Signed)
Spoke w/ Pt, informed him of recommendations. Pt denies severe symptoms but will seek treatment if he becomes worse. [facility]

## 2017-08-23 NOTE — Telephone Encounter (Signed)
Copied from CRM [phone]. Topic: Quick Communication - Rx Refill/Question >> Aug 23, 2017  2:51 PM Landry MellowFoltz, Melissa J wrote: Has the patient contacted their pharmacy? No.   (Agent: If no, request that the patient contact the pharmacy for the refill.)   Preferred Pharmacy (with phone number or street name): pt is having muscle pain leg leg above knee. If has been going on for few days. Pt would like med called in for this. [facility] [address]. Cb# [phone]   Agent: Please be advised that RX refills may take up to 3 business days. We ask that you follow-up with your pharmacy.

## 2017-08-23 NOTE — Telephone Encounter (Signed)
If he has severe symptoms, any rash or back pain needs to be seen. Otherwise recommend - OTC ibuprofen 200 mg: 2 tablets once or twice a day at most.  Take with food. -Also we could try Flexeril 10 mg 1 p.o. nightly as needed #20 no refills.

## 2017-08-27 ENCOUNTER — Ambulatory Visit: Payer: BLUE CROSS/BLUE SHIELD | Admitting: Internal Medicine

## 2017-09-03 ENCOUNTER — Ambulatory Visit: Payer: Self-pay | Admitting: Internal Medicine

## 2017-11-04 ENCOUNTER — Telehealth: Payer: Self-pay | Admitting: Internal Medicine

## 2017-11-04 NOTE — Telephone Encounter (Signed)
Copied from CRM [phone]. Topic: Quick Communication - Rx Refill/Question >> Nov 04, 2017  1:20 PM Oneal GroutSebastian, Jennifer S wrote: Medication:  sitaGLIPtin-metformin (JANUMET) 50-1000 MG tablet,  lisinopril (PRINIVIL,ZESTRIL) 5 MG tablet, hydrochlorothiazide (MICROZIDE) 12.5 MG capsule  Has the patient contacted their pharmacy? Yes.     (Agent: If no, request that the patient contact the pharmacy for the refill.)   Preferred Pharmacy (with phone number or street name): [facility] [address]    Agent: Please be advised that RX refills may take up to 3 business days. We ask that you follow-up with your pharmacy.

## 2017-11-04 NOTE — Telephone Encounter (Signed)
Also atorvastatin 20mg 

## 2017-11-05 NOTE — Telephone Encounter (Signed)
Called pt regarding his request for refills. He has an appointment coming up on Mar. 26, 2019. No answer, left message for him to give us a call back to assist him with his refills.

## 2017-11-12 ENCOUNTER — Ambulatory Visit: Payer: Self-pay | Admitting: Internal Medicine

## 2017-11-14 ENCOUNTER — Ambulatory Visit: Payer: Self-pay | Admitting: Internal Medicine

## 2017-11-15 ENCOUNTER — Ambulatory Visit: Payer: Managed Care, Other (non HMO) | Admitting: Internal Medicine

## 2017-11-15 ENCOUNTER — Encounter: Payer: Self-pay | Admitting: Internal Medicine

## 2017-11-15 VITALS — BP 128/78 | HR 79 | Temp 97.6°F | Resp 16 | Ht 72.0 in | Wt 310.5 lb

## 2017-11-15 DIAGNOSIS — Z794 Long term (current) use of insulin: Secondary | ICD-10-CM

## 2017-11-15 DIAGNOSIS — E119 Type 2 diabetes mellitus without complications: Secondary | ICD-10-CM

## 2017-11-15 DIAGNOSIS — B351 Tinea unguium: Secondary | ICD-10-CM

## 2017-11-15 DIAGNOSIS — D72829 Elevated white blood cell count, unspecified: Secondary | ICD-10-CM

## 2017-11-15 DIAGNOSIS — Z91013 Allergy to seafood: Secondary | ICD-10-CM | POA: Diagnosis not present

## 2017-11-15 DIAGNOSIS — E785 Hyperlipidemia, unspecified: Secondary | ICD-10-CM

## 2017-11-15 DIAGNOSIS — I10 Essential (primary) hypertension: Secondary | ICD-10-CM

## 2017-11-15 LAB — CBC WITH DIFFERENTIAL/PLATELET
BASOS PCT: 0.4 % (ref 0.0–3.0)
Basophils Absolute: 0.1 10*3/uL (ref 0.0–0.1)
EOS ABS: 0.3 10*3/uL (ref 0.0–0.7)
Eosinophils Relative: 2.3 % (ref 0.0–5.0)
HCT: 43.8 % (ref 39.0–52.0)
Hemoglobin: 14.3 g/dL (ref 13.0–17.0)
Lymphocytes Relative: 28.4 % (ref 12.0–46.0)
Lymphs Abs: 3.4 10*3/uL (ref 0.7–4.0)
MCHC: 32.6 g/dL (ref 30.0–36.0)
MCV: 87.6 fl (ref 78.0–100.0)
MONO ABS: 0.9 10*3/uL (ref 0.1–1.0)
Monocytes Relative: 7.6 % (ref 3.0–12.0)
NEUTROS ABS: 7.3 10*3/uL (ref 1.4–7.7)
Neutrophils Relative %: 61.3 % (ref 43.0–77.0)
PLATELETS: 241 10*3/uL (ref 150.0–400.0)
RBC: 5 Mil/uL (ref 4.22–5.81)
RDW: 13.1 % (ref 11.5–15.5)
WBC: 11.9 10*3/uL — ABNORMAL HIGH (ref 4.0–10.5)

## 2017-11-15 LAB — LIPID PANEL
CHOLESTEROL: 181 mg/dL (ref 0–200)
HDL: 37.3 mg/dL — AB (ref >39.00)
NonHDL: 143.96
Total CHOL/HDL Ratio: 5
Triglycerides: 226 mg/dL — ABNORMAL HIGH (ref 0.0–149.0)
VLDL: 45.2 mg/dL — AB (ref 0.0–40.0)

## 2017-11-15 LAB — COMPREHENSIVE METABOLIC PANEL
ALBUMIN: 4 g/dL (ref 3.5–5.2)
ALK PHOS: 48 U/L (ref 39–117)
ALT: 47 U/L (ref 0–53)
AST: 29 U/L (ref 0–37)
BUN: 14 mg/dL (ref 6–23)
CALCIUM: 9.6 mg/dL (ref 8.4–10.5)
CO2: 33 mEq/L — ABNORMAL HIGH (ref 19–32)
Chloride: 100 mEq/L (ref 96–112)
Creatinine, Ser: 0.97 mg/dL (ref 0.40–1.50)
GFR: 109.68 mL/min (ref >60.00)
Glucose, Bld: 208 mg/dL — ABNORMAL HIGH (ref 70–99)
POTASSIUM: 3.9 meq/L (ref 3.5–5.1)
Sodium: 139 mEq/L (ref 135–145)
TOTAL PROTEIN: 7.5 g/dL (ref 6.0–8.3)
Total Bilirubin: 0.5 mg/dL (ref 0.2–1.2)

## 2017-11-15 LAB — LDL CHOLESTEROL, DIRECT: LDL DIRECT: 105 mg/dL

## 2017-11-15 LAB — HEMOGLOBIN A1C: Hgb A1c MFr Bld: 7.6 % — ABNORMAL HIGH (ref 4.6–6.5)

## 2017-11-15 MED ORDER — INSULIN NPH (HUMAN) (ISOPHANE) 100 UNIT/ML ~~LOC~~ SUSP: SUBCUTANEOUS | 1 refills | Status: DC

## 2017-11-15 NOTE — Progress Notes (Signed)
Pre visit review using our clinic review tool, if applicable. No additional management support is needed unless otherwise documented below in the visit note. 

## 2017-11-15 NOTE — Progress Notes (Signed)
Subjective:    Patient ID: Michael Willis, male    DOB: 10-Mar-1976, 42 y.o.   MRN: [id number]  DOS:  11/15/2017 Type of visit - description : f/u Interval history: High chol: Reports good compliance with medications DM: Reports good compliance with insulin, no ambulatory CBGs, no symptoms of low blood sugar.  Diet has been extremely good in the last 3 months. Seafood allergy?  Twice in the past he ate a mix of seafood on the next day his eye was puffy. Denies tongue or lip swelling.  No difficulty breathing or wheezing.  No itching or rash. HTN: No ambulatory BPs Complained of his snoring, all his life, occasionally feels a sleepy.  + Witnessed apnea episodes by wife.  Wt Readings from Last 3 Encounters:  11/15/17 (!) 310 lb 8 oz (140.8 kg)  05/21/17 (!) 322 lb 4 oz (146.2 kg)  11/29/16 (!) 311 lb 8 oz (141.3 kg)     Review of Systems See  above, denies chest pain or difficulty breathing.  Past Medical History:  Diagnosis Date  . Diabetes (HCC) 01/05/2013  . Elevated LFTs 01/05/2013  . HTN (hypertension) 12/12/2012  . Hyperlipidemia 12/04/2013  . Obesity   . Tinea pedis, onychomycosis 01/05/2013    Past Surgical History:  Procedure Laterality Date  . NO PAST SURGERIES      Social History   Socioeconomic History  . Marital status: Single    Spouse name: Not on file  . Number of children: 5  . Years of education: Not on file  . Highest education level: Not on file  Occupational History  . 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 Financial resource strain: Not on file  . Food insecurity:    Worry: Not on file    Inability: Not on file  . Transportation needs:    Medical: Not on file    Non-medical: Not on file  Tobacco Use  . Smoking status: Former Smoker    Years: 6.00    Types: Cigars    Last attempt to quit: 09/18/2000    Years since quitting: 17.1  . Smokeless tobacco: Never Used  . Tobacco comment: smoke a few cigars off an on .   Substance and Sexual  Activity  . Alcohol use: Yes    Comment: socially  . Drug use: No  . Sexual activity: Not on file  Lifestyle  . Physical activity:    Days per week: Not on file    Minutes per session: Not on file  . Stress: Not on file  Relationships  . Social connections:    Talks on phone: Not on file    Gets together: Not on file    Attends religious service: Not on file    Active member of club or organization: Not on file    Attends meetings of clubs or organizations: Not on file    Relationship status: Not on file  . Intimate partner violence:    Fear of current or ex partner: Not on file    Emotionally abused: Not on file    Physically abused: Not on file    Forced sexual activity: Not on file  Other Topics Concern  . Not on file  Social History Narrative   Has a GF, live together .   Exercise: none   Diet: regular      Allergies as of 11/15/2017   No Known Allergies     Medication List  Accurate as of 11/15/17 11:59 PM. Always use your most recent med list.          aspirin 81 MG EC tablet Take 1 tablet (81 mg total) by mouth daily.   atorvastatin 20 MG tablet Commonly known as:  LIPITOR Take 1 tablet (20 mg total) by mouth at bedtime.   glucose blood test strip Check blood sugar three times daily   hydrochlorothiazide 12.5 MG capsule Commonly known as:  MICROZIDE Take 1 capsule (12.5 mg total) by mouth daily.   insulin NPH Human 100 UNIT/ML injection Commonly known as:  HUMULIN N,NOVOLIN N 25 units before breakfast, 15 units before dinner   INSULIN SYRINGE .5CC/28G 28G X 1/2" 0.5 ML Misc 1 Syringe by Does not apply route 2 (two) times daily.   lisinopril 5 MG tablet Commonly known as:  PRINIVIL,ZESTRIL Take 1 tablet (5 mg total) by mouth daily.   onetouch ultrasoft lancets Check blood sugar three times daily   sitaGLIPtin-metformin 50-1000 MG tablet Commonly known as:  JANUMET Take 1 tablet by mouth 2 (two) times daily with a meal.            Objective:   Physical Exam BP 128/78 (BP Location: Left Arm, Patient Position: Sitting, Cuff Size: Normal)   Pulse 79   Temp 97.6 F (36.4 C) (Oral)   Resp 16   Ht 6' (1.829 m)   Wt (!) 310 lb 8 oz (140.8 kg)   SpO2 97%   BMI 42.11 kg/m  General:   Well developed, well nourished . NAD.  HEENT:  Normocephalic . Face symmetric, atraumatic Lungs:  CTA B Normal respiratory effort, no intercostal retractions, no accessory muscle use. Heart: RRR,  no murmur.  No pretibial edema bilaterally  DIABETIC FEET EXAM: No lower extremity edema Normal pedal pulses bilaterally Skin dry, scaly throughout the feet.  + Maceration between toes.  Nails are thick Pinprick examination of the feet normal. Neurologic:  alert & oriented X3.  Speech normal, gait appropriate for age and unassisted Psych--  Cognition and judgment appear intact.  Cooperative with normal attention span and concentration.  Behavior appropriate. No anxious or depressed appearing.      Assessment & Plan:   Assessment DM 2014 HTN Hyperlipidemia Elevated LFTs Morbid obesity Onychomycosis Snoring: suspect OSA, saw pulmonary 2014, did not purse sleep study  PLAN: DM: Well-controlled with insulin NPH 25 units a.m., 15 units p.m.  No ambulatory CBGs.  Will print a RX for NPH so far he has been paying out of pocket and wonders if his insurance will cover.  Also continue Janumet. Check A1c.  Feet exam with no neuropathy. HTN: On HCTZ and lisinopril, check a CMP Hyperlipidemia: On Lipitor, check a FLP Snoring: Long h/o snoring, less noticeable since he has lost weight, wife has noticed apneic episodes.  Epworth scale 4 which is negative. I still suspect OSA, he actually saw pulmonary 2014, did not purse a sleep study . We talk about a referral again to further eval, they elected to wait, hopefully with continue weight loss snoring will continue improving. Onychomycosis: Diffuse disease, I also suspect tinea pedis.  Send  nail KOH and culture.  Treat with oral meds if appropriate. Seafood allergy?  Refer to an allergist Elevated WBC: Recheck, refer to hematology? RTC 4 months

## 2017-11-15 NOTE — Patient Instructions (Signed)
GO TO THE LAB : Get the blood work     GO TO THE FRONT DESK Schedule your next appointment for a   routine checkup in 4 months 

## 2017-11-16 NOTE — Assessment & Plan Note (Addendum)
DM: Well-controlled with insulin NPH 25 units a.m., 15 units p.m.  No ambulatory CBGs.  Will print a RX for NPH so far he has been paying out of pocket and wonders if his insurance will cover.  Also continue Janumet. Check A1c.  Feet exam with no neuropathy. HTN: On HCTZ and lisinopril, check a CMP Hyperlipidemia: On Lipitor, check a FLP Snoring: Long h/o snoring, less noticeable since he has lost weight, wife has noticed apneic episodes.  Epworth scale 4 which is negative. I still suspect OSA, he actually saw pulmonary 2014, did not purse a sleep study . We talk about a referral again to further eval, they elected to wait, hopefully with continue weight loss snoring will continue improving. Onychomycosis: Diffuse disease, I also suspect tinea pedis.  Send nail KOH and culture.  Treat with oral meds if appropriate. Seafood allergy?  Refer to an allergist Elevated WBC: Recheck, refer to hematology? RTC 4 months

## 2017-12-03 LAB — CULT, FUNGUS, SKIN,HAIR,NAIL W/KOH
MICRO NUMBER:: 90393967
SPECIMEN QUALITY: ADEQUATE

## 2017-12-03 NOTE — Addendum Note (Signed)
Addended byConrad Glouster: Lady Wisham D on: 12/03/2017 10:57 AM   Modules accepted: Orders

## 2017-12-23 ENCOUNTER — Telehealth: Payer: Self-pay | Admitting: Internal Medicine

## 2017-12-23 DIAGNOSIS — B351 Tinea unguium: Secondary | ICD-10-CM

## 2017-12-23 NOTE — Telephone Encounter (Signed)
Patient's wife Michael Willis called back to discuss lab results that were mailed and received. Results given per notes Dr. Drue Novel on 11/20/17 and 12/03/17, she verbalized understanding. She asks why the increase insulin dosages. I advised of the A1C results and they are not in range, so increasing the insulin dosages will likely bring the results in range and to monitor his diet, she verbalized understanding. She asks about the Lamisil, I explained he will take it for 1 month, and in 3 weeks get labs. I explained LFT's because this medicine can affect the liver enzymes. If there are no changes to his liver enzymes, then he will get a prescription for 3 months. She verbalized understanding. She asked about the hematology referral and would like for them to call back, if they have tried to call. I advised this would be sent to Dr. Drue Novel. Lab appointment scheduled for 01/15/18 at 1010.

## 2017-12-23 NOTE — Telephone Encounter (Signed)
Hematology [facility] on 12/16/2017, they will need to call them back to schedule.

## 2017-12-24 MED ORDER — TERBINAFINE HCL 250 MG PO TABS: 250.0000 mg | ORAL_TABLET | Freq: Every day | ORAL | 0 refills | Status: DC

## 2017-12-24 NOTE — Addendum Note (Signed)
Addended byConrad [place] D on: 12/24/2017 12:54 PM   Modules accepted: Orders

## 2017-12-24 NOTE — Telephone Encounter (Signed)
Notes recorded by Wanda Plump, MD on 11/20/2017 at 5:40 PM EDT Advised patient: Needs increase insulin to: 32 units a.m., 18 units p.m., watch for low sugar symptoms. Cholesterol is well controlled Continue with elevated white count, please arrange a referral to see hematology. Preliminary testing showing a fungal infection, start Lamisil 250 mg 1 p.o. daily #30, no refills. LFTs in 3 weeks. Please arrange. If LFTs okay will continue Lamisil for a total of 3 months. If Lamisil is not available will try a different medication.

## 2017-12-24 NOTE — Telephone Encounter (Signed)
Wife called - she is asking if the lamisil medication is oral, and when it will be sent to [facility] [address]  cb is [phone]

## 2017-12-24 NOTE — Telephone Encounter (Signed)
Lamisil is an oral medication. Pt's wife was informed that yesterday via telephone. See below. Lamisil  1 tab daily #30 and 0rf sent to [facility]. Hepatic function panel ordered to be completed in 3 weeks. Lab appt scheduled for 01/15/2018.

## 2018-01-15 ENCOUNTER — Other Ambulatory Visit (INDEPENDENT_AMBULATORY_CARE_PROVIDER_SITE_OTHER): Payer: Managed Care, Other (non HMO)

## 2018-01-15 DIAGNOSIS — B351 Tinea unguium: Secondary | ICD-10-CM | POA: Diagnosis not present

## 2018-01-15 LAB — HEPATIC FUNCTION PANEL
ALBUMIN: 4.1 g/dL (ref 3.5–5.2)
ALT: 42 U/L (ref 0–53)
AST: 20 U/L (ref 0–37)
Alkaline Phosphatase: 49 U/L (ref 39–117)
BILIRUBIN DIRECT: 0.1 mg/dL (ref 0.0–0.3)
TOTAL PROTEIN: 7.4 g/dL (ref 6.0–8.3)
Total Bilirubin: 0.6 mg/dL (ref 0.2–1.2)

## 2018-01-16 MED ORDER — TERBINAFINE HCL 250 MG PO TABS: 250.0000 mg | ORAL_TABLET | Freq: Every day | ORAL | 1 refills | Status: DC

## 2018-01-16 NOTE — Addendum Note (Signed)
Addended byConrad [place] D on: 01/16/2018 03:27 PM   Modules accepted: Orders

## 2018-01-21 ENCOUNTER — Other Ambulatory Visit: Payer: Self-pay | Admitting: Family

## 2018-01-21 DIAGNOSIS — D72829 Elevated white blood cell count, unspecified: Secondary | ICD-10-CM

## 2018-01-22 ENCOUNTER — Inpatient Hospital Stay: Payer: Managed Care, Other (non HMO) | Attending: Family | Admitting: Family

## 2018-01-22 ENCOUNTER — Inpatient Hospital Stay: Payer: Managed Care, Other (non HMO)

## 2018-01-27 ENCOUNTER — Other Ambulatory Visit: Payer: Self-pay | Admitting: Internal Medicine

## 2018-02-04 ENCOUNTER — Ambulatory Visit (INDEPENDENT_AMBULATORY_CARE_PROVIDER_SITE_OTHER): Payer: Managed Care, Other (non HMO) | Admitting: Family Medicine

## 2018-02-04 ENCOUNTER — Encounter: Payer: Self-pay | Admitting: Family Medicine

## 2018-02-04 VITALS — BP 132/66 | HR 83 | Temp 98.2°F | Ht 72.0 in | Wt 312.0 lb

## 2018-02-04 DIAGNOSIS — H02843 Edema of right eye, unspecified eyelid: Secondary | ICD-10-CM

## 2018-02-04 NOTE — Progress Notes (Signed)
Michael Willis - 42 y.o. male MRN [id number]  Date of birth: [birth year]  SUBJECTIVE:  Including CC & ROS.  Chief Complaint  Patient presents with  . Insect Bite    Michael Willis is a 42 y.o. male that is here today for an evaluation of an insect bite. He was mowing his yard last night, he got stung by a bee on his head. Admits to ongoing swelling on his head and forehead. He took benadryl last night, swelling has not improved. Denies trouble breathing or chest pains.  He denies any shortness of breath or trouble breathing.  He is having some eye swelling on his right eye.   Review of Systems  Constitutional: Negative for fever.  HENT: Negative for trouble swallowing.   Eyes: Negative for photophobia.  Respiratory: Negative for cough and shortness of breath.   Cardiovascular: Negative for chest pain.  Gastrointestinal: Negative for abdominal pain.    HISTORY: Past Medical, Surgical, Social, and Family History Reviewed & Updated per EMR.   Pertinent Historical Findings include:  Past Medical History:  Diagnosis Date  . Diabetes (HCC) 01/05/2013  . Elevated LFTs 01/05/2013  . HTN (hypertension) 12/12/2012  . Hyperlipidemia 12/04/2013  . Obesity   . Tinea pedis, onychomycosis 01/05/2013    Past Surgical History:  Procedure Laterality Date  . NO PAST SURGERIES      No Known Allergies  Family History  Problem Relation Age of Onset  . CAD Mother 7       MI?  . Diabetes Neg Hx   . Stroke Neg Hx   . Colon cancer Neg Hx   . Prostate cancer Neg Hx      Social History   Socioeconomic History  . Marital status: Single    Spouse name: Not on file  . Number of children: 5  . Years of education: Not on file  . Highest education level: Not on file  Occupational History  . 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 Financial resource strain: Not on file  . Food insecurity:    Worry: Not on file    Inability: Not on file  . Transportation needs:    Medical: Not on file   Non-medical: Not on file  Tobacco Use  . Smoking status: Former Smoker    Years: 6.00    Types: Cigars    Last attempt to quit: 09/18/2000    Years since quitting: 17.3  . Smokeless tobacco: Never Used  . Tobacco comment: smoke a few cigars off an on .   Substance and Sexual Activity  . Alcohol use: Yes    Comment: socially  . Drug use: No  . Sexual activity: Not on file  Lifestyle  . Physical activity:    Days per week: Not on file    Minutes per session: Not on file  . Stress: Not on file  Relationships  . Social connections:    Talks on phone: Not on file    Gets together: Not on file    Attends religious service: Not on file    Active member of club or organization: Not on file    Attends meetings of clubs or organizations: Not on file    Relationship status: Not on file  . Intimate partner violence:    Fear of current or ex partner: Not on file    Emotionally abused: Not on file    Physically abused: Not on file    Forced sexual activity:  Not on file  Other Topics Concern  . Not on file  Social History Narrative   Has a GF, live together .   Exercise: none   Diet: regular     PHYSICAL EXAM:  VS: BP 132/66 (BP Location: Left Arm, Patient Position: Sitting, Cuff Size: Large)   Pulse 83   Temp 98.2 F (36.8 C) (Oral)   Ht 6' (1.829 m)   Wt (!) 312 lb (141.5 kg)   SpO2 96%   BMI 42.31 kg/m  Physical Exam Gen: NAD, alert, cooperative with exam, well-appearing ENT: normal lips, normal nasal mucosa,  Eye: normal EOM, normal conjunctiva, periorbital edema around the right eye CV:  no edema, +2 pedal pulses   Resp: no accessory muscle use, non-labored,  Skin: no rashes, no areas of induration  Neuro: normal tone, normal sensation to touch Psych:  normal insight, alert and oriented MSK: Normal gait, normal strength     ASSESSMENT & PLAN:   Swelling of right eyelid Occurring after an insect sting. No signs of anaphylaxis  - counseled on conservative  care - if no improvement consider steroids.  - given indications to seek immediate care

## 2018-02-04 NOTE — Assessment & Plan Note (Signed)
Occurring after an insect sting. No signs of anaphylaxis  - counseled on conservative care - if no improvement consider steroids.  - given indications to seek immediate care

## 2018-02-04 NOTE — Patient Instructions (Signed)
Nice to meet you  Please take a Claritin, Allegra, or Zyrtec in the morning and taken Benadryl at night. If your symptoms are not improving then I can send in some steroids. If you notice any throat closure or trouble breathing you need to seek immediate care.

## 2018-03-18 ENCOUNTER — Encounter: Payer: Self-pay | Admitting: Internal Medicine

## 2018-03-18 ENCOUNTER — Ambulatory Visit (INDEPENDENT_AMBULATORY_CARE_PROVIDER_SITE_OTHER): Payer: Managed Care, Other (non HMO) | Admitting: Internal Medicine

## 2018-03-18 VITALS — BP 126/84 | HR 87 | Temp 97.7°F | Resp 16 | Ht 72.0 in | Wt 309.4 lb

## 2018-03-18 DIAGNOSIS — E1110 Type 2 diabetes mellitus with ketoacidosis without coma: Secondary | ICD-10-CM | POA: Diagnosis not present

## 2018-03-18 DIAGNOSIS — D72829 Elevated white blood cell count, unspecified: Secondary | ICD-10-CM

## 2018-03-18 DIAGNOSIS — Z09 Encounter for follow-up examination after completed treatment for conditions other than malignant neoplasm: Secondary | ICD-10-CM | POA: Diagnosis not present

## 2018-03-18 LAB — COMPREHENSIVE METABOLIC PANEL
ALBUMIN: 4.1 g/dL (ref 3.5–5.2)
ALK PHOS: 50 U/L (ref 39–117)
ALT: 50 U/L (ref 0–53)
AST: 26 U/L (ref 0–37)
BILIRUBIN TOTAL: 0.6 mg/dL (ref 0.2–1.2)
BUN: 15 mg/dL (ref 6–23)
CALCIUM: 9.9 mg/dL (ref 8.4–10.5)
CO2: 32 meq/L (ref 19–32)
Chloride: 101 mEq/L (ref 96–112)
Creatinine, Ser: 1.03 mg/dL (ref 0.40–1.50)
GFR: 102.17 mL/min (ref >60.00)
Glucose, Bld: 211 mg/dL — ABNORMAL HIGH (ref 70–99)
Potassium: 4 mEq/L (ref 3.5–5.1)
Sodium: 138 mEq/L (ref 135–145)
TOTAL PROTEIN: 7.2 g/dL (ref 6.0–8.3)

## 2018-03-18 NOTE — Patient Instructions (Addendum)
GO TO THE LAB : Get the blood work     GO TO THE FRONT DESK Schedule your next appointment for a checkup in 4 weeks  Increase your insulin to 32 units with breakfast on 18 units with dinner  Check your blood sugars once or twice a day, call with readings in 2 weeks  Start taking Lamisil daily for the nail infection

## 2018-03-18 NOTE — Progress Notes (Signed)
CMPPre visit review using our clinic review tool, if applicable. No additional management support is needed unless otherwise documented below in the visit note.

## 2018-03-18 NOTE — Progress Notes (Signed)
Subjective:    Patient ID: Michael Willis, male    DOB: [birth year], 42 y.o.   MRN: [id number]  DOS:  03/18/2018 Type of visit - description : rov Interval history: Since the last office visit, he has not changed his insulin   Review of Systems Denies chest pain difficulty breathing No rash, nausea, vomiting or diarrhea Past Medical History:  Diagnosis Date  . Diabetes (HCC) 01/05/2013  . Elevated LFTs 01/05/2013  . HTN (hypertension) 12/12/2012  . Hyperlipidemia 12/04/2013  . Obesity   . Tinea pedis, onychomycosis 01/05/2013    Past Surgical History:  Procedure Laterality Date  . NO PAST SURGERIES      Social History   Socioeconomic History  . Marital status: Single    Spouse name: Not on file  . Number of children: 5  . Years of education: Not on file  . Highest education level: Not on file  Occupational History  . Occupation: not working   Engineer, productionocial Needs  . Financial resource strain: Not on file  . Food insecurity:    Worry: Not on file    Inability: Not on file  . Transportation needs:    Medical: Not on file    Non-medical: Not on file  Tobacco Use  . Smoking status: Former Smoker    Years: 6.00    Types: Cigars    Last attempt to quit: 09/18/2000    Years since quitting: 17.5  . Smokeless tobacco: Never Used  . Tobacco comment: smoke a few cigars off an on .   Substance and Sexual Activity  . Alcohol use: Yes    Comment: socially  . Drug use: No  . Sexual activity: Not on file  Lifestyle  . Physical activity:    Days per week: Not on file    Minutes per session: Not on file  . Stress: Not on file  Relationships  . Social connections:    Talks on phone: Not on file    Gets together: Not on file    Attends religious service: Not on file    Active member of club or organization: Not on file    Attends meetings of clubs or organizations: Not on file    Relationship status: Not on file  . Intimate partner violence:    Fear of current or ex partner:  Not on file    Emotionally abused: Not on file    Physically abused: Not on file    Forced sexual activity: Not on file  Other Topics Concern  . Not on file  Social History Narrative   Has a GF, live together .   Exercise: none   Diet: regular      Allergies as of 03/18/2018   No Known Allergies     Medication List        Accurate as of 03/18/18 11:59 PM. Always use your most recent med list.          aspirin 81 MG EC tablet Take 1 tablet (81 mg total) by mouth daily.   atorvastatin 20 MG tablet Commonly known as:  LIPITOR Take 1 tablet (20 mg total) by mouth at bedtime.   glucose blood test strip Check blood sugar three times daily   hydrochlorothiazide 12.5 MG capsule Commonly known as:  MICROZIDE Take 1 capsule (12.5 mg total) by mouth daily.   insulin NPH Human 100 UNIT/ML injection Commonly known as:  HUMULIN N,NOVOLIN N 32 units before breakfast, 18 units before dinner  INSULIN SYRINGE .5CC/28G 28G X 1/2" 0.5 ML Misc 1 Syringe by Does not apply route 2 (two) times daily.   lisinopril 5 MG tablet Commonly known as:  PRINIVIL,ZESTRIL Take 1 tablet (5 mg total) by mouth daily.   onetouch ultrasoft lancets Check blood sugar three times daily   sitaGLIPtin-metformin 50-1000 MG tablet Commonly known as:  JANUMET Take 1 tablet by mouth 2 (two) times daily with a meal.   terbinafine 250 MG tablet Commonly known as:  LAMISIL Take 1 tablet (250 mg total) by mouth daily.          Objective:   Physical Exam BP 126/84 (BP Location: Left Arm, Patient Position: Sitting, Cuff Size: Normal)   Pulse 87   Temp 97.7 F (36.5 C) (Oral)   Resp 16   Ht 6' (1.829 m)   Wt (!) 309 lb 6 oz (140.3 kg)   SpO2 96%   BMI 41.96 kg/m  General:   Well developed, NAD, see BMI.  HEENT:  Normocephalic . Face symmetric, atraumatic Lungs:  CTA B Normal respiratory effort, no intercostal retractions, no accessory muscle use. Heart: RRR,  no murmur.  No pretibial  edema bilaterally  Skin: Not pale. Not jaundice Neurologic:  alert & oriented X3.  Speech normal, gait appropriate for age and unassisted Psych--  Cognition and judgment appear intact.  Cooperative with normal attention span and concentration.  Behavior appropriate. No anxious or depressed appearing.      Assessment & Plan:  Assessment DM 2014 HTN Hyperlipidemia Elevated LFTs Morbid obesity Onychomycosis Snoring: suspect OSA, saw pulmonary 2014, did not purse sleep study  PLAN: DM: Last A1c was 7.6, NPH Increased from 25 and 15 units to 32 on 18 units but the patient did not proceed with the changes, states simply "keep forgetting" to take more.  Checks CBGs but doesn't remember any readings. Plan: Go ahead with NPH 32 on 18 units, record CBGs, call with readings in 2 weeks Onychomycosis: Documented with culture, rec proceed with Lamisil which was already sent, LFTs 1 month after his start Lamisil. Increase WBCs: Hematology order additional labs, will proceed with them today. RTC 4 weeks

## 2018-03-19 LAB — CBC (INCLUDES DIFF/PLT) WITH PATHOLOGIST REVIEW
BASOS ABS: 54 {cells}/uL (ref 0–200)
Basophils Relative: 0.5 %
EOS PCT: 1.9 %
Eosinophils Absolute: 203 cells/uL (ref 15–500)
HCT: 42.6 % (ref 38.5–50.0)
HEMOGLOBIN: 14.4 g/dL (ref 13.2–17.1)
LYMPHS ABS: 2932 {cells}/uL (ref 850–3900)
MCH: 28.3 pg (ref 27.0–33.0)
MCHC: 33.8 g/dL (ref 32.0–36.0)
MCV: 83.9 fL (ref 80.0–100.0)
MPV: 11.3 fL (ref 7.5–12.5)
Monocytes Relative: 7.4 %
NEUTROS PCT: 62.8 %
Neutro Abs: 6720 cells/uL (ref 1500–7800)
Platelets: 216 10*3/uL (ref 140–400)
RBC: 5.08 10*6/uL (ref 4.20–5.80)
RDW: 12 % (ref 11.0–15.0)
Total Lymphocyte: 27.4 %
WBC mixed population: 792 cells/uL (ref 200–950)
WBC: 10.7 10*3/uL (ref 3.8–10.8)

## 2018-03-19 LAB — LACTATE DEHYDROGENASE: LDH: 194 U/L (ref 100–220)

## 2018-03-19 NOTE — Assessment & Plan Note (Signed)
DM: Last A1c was 7.6, NPH Increased from 25 and 15 units to 32 on 18 units but the patient did not proceed with the changes, states simply "keep forgetting" to take more.  Checks CBGs but doesn't remember any readings. Plan: Go ahead with NPH 32 on 18 units, record CBGs, call with readings in 2 weeks Onychomycosis: Documented with culture, rec proceed with Lamisil which was already sent, LFTs 1 month after his start Lamisil. Increase WBCs: Hematology order additional labs, will proceed with them today. RTC 4 weeks

## 2018-04-12 ENCOUNTER — Other Ambulatory Visit: Payer: Self-pay | Admitting: Internal Medicine

## 2018-04-12 DIAGNOSIS — I10 Essential (primary) hypertension: Secondary | ICD-10-CM

## 2018-04-17 ENCOUNTER — Telehealth: Payer: Self-pay | Admitting: Internal Medicine

## 2018-04-17 MED ORDER — LISINOPRIL 5 MG PO TABS: 5.0000 mg | ORAL_TABLET | Freq: Every day | ORAL | 5 refills | Status: DC

## 2018-04-17 MED ORDER — HYDROCHLOROTHIAZIDE 12.5 MG PO CAPS: 12.5000 mg | ORAL_CAPSULE | Freq: Every day | ORAL | 5 refills | Status: DC

## 2018-04-17 NOTE — Addendum Note (Signed)
Addended by: Valentina GuMELLEN, Isha Seefeld R on: 04/17/2018 01:15 PM   Modules accepted: Orders

## 2018-04-17 NOTE — Telephone Encounter (Signed)
Pt/spouse called in to follow up on refill request for medications requested. Advised per chart, showing Rx's were sent in to pharmacy and confirmed by pharmacy. Pt/spouse says that pharmacy is stating that they have not received. Pt would like to know if someone could contact pharmacy and assist further.   Confirmed pharmacy with pt/spouse, they are stating that the pharmacy information is correct.

## 2018-04-17 NOTE — Telephone Encounter (Signed)
Copied from CRM [id number]. Topic: Quick Communication - Rx Refill/Question >> Apr 17, 2018 12:49 PM Jonette EvaBarksdale, Harvey B wrote: Pharmacy says they never got the request from office  Medication: hydrochlorothiazide (MICROZIDE) 12.5 MG capsule [[id number]]  lisinopril (PRINIVIL,ZESTRIL) 5 MG tablet [[id number]][[id number]]   Has the patient contacted their pharmacy? Yes.   (Agent: If no, request that the patient contact the pharmacy for the refill.) (Agent: If yes, when and what did the pharmacy advise?)  Preferred Pharmacy (with phone number or street name): [facility]  Agent: Please be advised that RX refills may take up to 3 business days. We ask that you follow-up with your pharmacy.

## 2018-04-17 NOTE — Telephone Encounter (Signed)
Rx for HCTZ and lisinopril resent to [facility] on [address] as pharmacist stated that they did not receive the rx requests originally sent by author on 8/26. Chartered loss adjusterAuthor routed to [place], CMA to make sure pt. Received.

## 2018-04-23 NOTE — Telephone Encounter (Signed)
rx sent 04/18/18

## 2018-09-14 ENCOUNTER — Other Ambulatory Visit: Payer: Self-pay | Admitting: Internal Medicine

## 2018-10-20 ENCOUNTER — Telehealth: Payer: Self-pay | Admitting: Internal Medicine

## 2018-10-20 NOTE — Telephone Encounter (Signed)
Copied from CRM [id number]. Topic: Quick Communication - See Telephone Encounter >> Oct 20, 2018  4:39 PM Terisa Starr wrote: CRM for notification. See Telephone encounter for: 10/20/18.  Patient's wife is calling to see if she can get some samples for JANUMET 50-1000 MG tablet until they their new insurance plan. He has been two weeks without it

## 2018-10-20 NOTE — Telephone Encounter (Signed)
Spoke w/ Pt- informed that office does not have samples. Also informed that he is overdue for visit- he previously cancelled or no showed appts after sending medications to pharmacy informed that we will be unable to do that again. He verbalized understanding and hung up.

## 2018-10-23 ENCOUNTER — Emergency Department (HOSPITAL_COMMUNITY): Payer: Self-pay

## 2018-10-23 ENCOUNTER — Emergency Department (HOSPITAL_COMMUNITY)
Admission: EM | Admit: 2018-10-23 | Discharge: 2018-10-23 | Disposition: A | Discharge status: Home / Self Care | Payer: Self-pay | Attending: Emergency Medicine | Admitting: Emergency Medicine

## 2018-10-23 ENCOUNTER — Other Ambulatory Visit: Payer: Self-pay

## 2018-10-23 ENCOUNTER — Other Ambulatory Visit: Payer: Self-pay | Admitting: Internal Medicine

## 2018-10-23 ENCOUNTER — Encounter (HOSPITAL_COMMUNITY): Payer: Self-pay

## 2018-10-23 DIAGNOSIS — J111 Influenza due to unidentified influenza virus with other respiratory manifestations: Secondary | ICD-10-CM | POA: Insufficient documentation

## 2018-10-23 DIAGNOSIS — J101 Influenza due to other identified influenza virus with other respiratory manifestations: Secondary | ICD-10-CM

## 2018-10-23 DIAGNOSIS — Z87891 Personal history of nicotine dependence: Secondary | ICD-10-CM | POA: Insufficient documentation

## 2018-10-23 DIAGNOSIS — Z79899 Other long term (current) drug therapy: Secondary | ICD-10-CM | POA: Insufficient documentation

## 2018-10-23 DIAGNOSIS — I10 Essential (primary) hypertension: Secondary | ICD-10-CM | POA: Insufficient documentation

## 2018-10-23 DIAGNOSIS — E119 Type 2 diabetes mellitus without complications: Secondary | ICD-10-CM | POA: Insufficient documentation

## 2018-10-23 DIAGNOSIS — Z794 Long term (current) use of insulin: Secondary | ICD-10-CM | POA: Insufficient documentation

## 2018-10-23 DIAGNOSIS — Z7982 Long term (current) use of aspirin: Secondary | ICD-10-CM | POA: Insufficient documentation

## 2018-10-23 LAB — CBC WITH DIFFERENTIAL/PLATELET
Abs Immature Granulocytes: 0.14 10*3/uL — ABNORMAL HIGH (ref 0.00–0.07)
BASOS PCT: 1 %
Basophils Absolute: 0.1 10*3/uL (ref 0.0–0.1)
Eosinophils Absolute: 0.1 10*3/uL (ref 0.0–0.5)
Eosinophils Relative: 1 %
HCT: 45.3 % (ref 39.0–52.0)
Hemoglobin: 15.2 g/dL (ref 13.0–17.0)
Immature Granulocytes: 1 %
Lymphocytes Relative: 8 %
Lymphs Abs: 0.8 10*3/uL (ref 0.7–4.0)
MCH: 28.4 pg (ref 26.0–34.0)
MCHC: 33.6 g/dL (ref 30.0–36.0)
MCV: 84.5 fL (ref 80.0–100.0)
Monocytes Absolute: 0.9 10*3/uL (ref 0.1–1.0)
Monocytes Relative: 8 %
NEUTROS ABS: 8.9 10*3/uL — AB (ref 1.7–7.7)
NEUTROS PCT: 81 %
Platelets: 155 10*3/uL (ref 150–400)
RBC: 5.36 MIL/uL (ref 4.22–5.81)
RDW: 11.9 % (ref 11.5–15.5)
WBC: 10.8 10*3/uL — ABNORMAL HIGH (ref 4.0–10.5)
nRBC: 0 % (ref 0.0–0.2)

## 2018-10-23 LAB — URINALYSIS, ROUTINE W REFLEX MICROSCOPIC
BACTERIA UA: NONE SEEN
Bilirubin Urine: NEGATIVE
Glucose, UA: >=500 mg/dL — AB
Hgb urine dipstick: NEGATIVE
Ketones, ur: 5 mg/dL — AB
Leukocytes,Ua: NEGATIVE
Nitrite: NEGATIVE
Protein, ur: NEGATIVE mg/dL
Specific Gravity, Urine: 1.015 (ref 1.005–1.030)
pH: 7 (ref 5.0–8.0)

## 2018-10-23 LAB — BASIC METABOLIC PANEL
Anion gap: 14 (ref 5–15)
BUN: 9 mg/dL (ref 6–20)
CO2: 25 mmol/L (ref 22–32)
Calcium: 9.4 mg/dL (ref 8.9–10.3)
Chloride: 98 mmol/L (ref 98–111)
Creatinine, Ser: 1.12 mg/dL (ref 0.61–1.24)
GFR calc Af Amer: >60 mL/min (ref >60)
GFR calc non Af Amer: >60 mL/min (ref >60)
GLUCOSE: 289 mg/dL — AB (ref 70–99)
Potassium: 4 mmol/L (ref 3.5–5.1)
Sodium: 137 mmol/L (ref 135–145)

## 2018-10-23 LAB — CBG MONITORING, ED
Glucose-Capillary: 285 mg/dL — ABNORMAL HIGH (ref 70–99)
Glucose-Capillary: 250 mg/dL — ABNORMAL HIGH (ref 70–99)
Glucose-Capillary: 242 mg/dL — ABNORMAL HIGH (ref 70–99)

## 2018-10-23 LAB — INFLUENZA PANEL BY PCR (TYPE A & B)
Influenza A By PCR: POSITIVE — AB
Influenza B By PCR: NEGATIVE

## 2018-10-23 MED ORDER — SITAGLIPTIN PHOS-METFORMIN HCL 50-1000 MG PO TABS: 1.0000 | ORAL_TABLET | Freq: Two times a day (BID) | ORAL | 0 refills | Status: DC

## 2018-10-23 MED ORDER — SODIUM CHLORIDE 0.9 % IV BOLUS
500.0000 mL | Freq: Once | INTRAVENOUS | Status: AC
  Administered 2018-10-23: 500 mL via INTRAVENOUS

## 2018-10-23 MED ORDER — LISINOPRIL 10 MG PO TABS
5.0000 mg | ORAL_TABLET | Freq: Every day | ORAL | Status: DC
  Administered 2018-10-23: 5 mg via ORAL
  Filled 2018-10-23: qty 1

## 2018-10-23 MED ORDER — IBUPROFEN 400 MG PO TABS
600.0000 mg | ORAL_TABLET | Freq: Once | ORAL | Status: AC
  Administered 2018-10-23: 600 mg via ORAL
  Filled 2018-10-23: qty 1

## 2018-10-23 MED ORDER — HYDROCHLOROTHIAZIDE 12.5 MG PO CAPS
12.5000 mg | ORAL_CAPSULE | Freq: Every day | ORAL | Status: DC
  Administered 2018-10-23: 12.5 mg via ORAL
  Filled 2018-10-23: qty 1

## 2018-10-23 MED ORDER — OSELTAMIVIR PHOSPHATE 75 MG PO CAPS: 75.0000 mg | ORAL_CAPSULE | Freq: Two times a day (BID) | ORAL | 0 refills | Status: DC

## 2018-10-23 MED ORDER — OSELTAMIVIR PHOSPHATE 75 MG PO CAPS
75.0000 mg | ORAL_CAPSULE | Freq: Once | ORAL | Status: AC
  Administered 2018-10-23: 75 mg via ORAL
  Filled 2018-10-23: qty 1

## 2018-10-23 MED ORDER — SODIUM CHLORIDE 0.9 % IV BOLUS
1000.0000 mL | Freq: Once | INTRAVENOUS | Status: AC
  Administered 2018-10-23: 1000 mL via INTRAVENOUS

## 2018-10-23 MED ORDER — INSULIN ASPART 100 UNIT/ML ~~LOC~~ SOLN
6.0000 [IU] | Freq: Once | SUBCUTANEOUS | Status: AC
  Administered 2018-10-23: 6 [IU] via SUBCUTANEOUS

## 2018-10-23 MED ORDER — ACETAMINOPHEN 325 MG PO TABS
650.0000 mg | ORAL_TABLET | Freq: Once | ORAL | Status: AC
  Administered 2018-10-23: 650 mg via ORAL
  Filled 2018-10-23: qty 2

## 2018-10-23 NOTE — ED Notes (Signed)
Patient transported to X-ray 

## 2018-10-23 NOTE — ED Provider Notes (Signed)
  Physical Exam  BP (!) 175/95   Pulse (!) 122   Temp (!) 100.6 F (38.1 C) (Oral)   Resp 16   Ht 5\' 10"  (1.778 m)   Wt (!) 136.5 kg   SpO2 95%   BMI 43.19 kg/m   Physical Exam Vitals signs and nursing note reviewed.  Constitutional:      General: He is not in acute distress.    Appearance: He is well-developed. He is not diaphoretic.  HENT:     Head: Normocephalic and atraumatic.  Eyes:     General: No scleral icterus.    Conjunctiva/sclera: Conjunctivae normal.  Neck:     Musculoskeletal: Normal range of motion.  Pulmonary:     Effort: Pulmonary effort is normal. No respiratory distress.  Skin:    Findings: No rash.  Neurological:     Mental Status: He is alert.     ED Course/Procedures     Procedures  MDM  Care handed off from previous provider PA couture.  Please see their note for further detail.  Briefly, patient is a 43 year old male with past medical history of diabetes, hypertension, hyperlipidemia, obesity presenting to the ED with a chief complaint of dry cough since last night.  Denies chest pain or shortness of breath.  He also states that he ran out of his Janumet 2 weeks ago.  Wife reports frequent urination since then.  Lab work here is reassuring.  He is positive for flu A.  5 ketones noted in urine, CBG elevated in 200s.  Patient persistently tachycardic despite defervescence.  Plan is to give fluids until tachycardia improves.  6:58 PM Patient tachycardic to 110s.  States that he feels well and is requesting discharge home.  He continues to deny chest pain, shortness of breath.  He is unsure if his heart rate has always been high.  He states that "I always felt fine, I just had a cough, that's all."  Have provided a refill for Janumet but there are concerned because patient does not currently have health insurance and cannot afford the medications as it is worth $500.  I have consulted case management who will help with the patient. He is otherwise stable  for discharge home with Tamiflu and PCP follow-up.   Patient is hemodynamically stable, in NAD, and able to ambulate in the ED. Evaluation does not show pathology that would require ongoing emergent intervention or inpatient treatment. I explained the diagnosis to the patient. Pain has been managed and has no complaints prior to discharge. Patient is comfortable with above plan and is stable for discharge at this time. All questions were answered prior to disposition. Strict return precautions for returning to the ED were discussed. Encouraged follow up with PCP.    Portions of this note were generated with Scientist, clinical (histocompatibility and immunogenetics). Dictation errors may occur despite best attempts at proofreading.      Dietrich Pates, PA-C 10/23/18 1859    Maia Plan, MD 10/24/18 1120

## 2018-10-23 NOTE — Discharge Instructions (Addendum)
You were given a prescription for Tamiflu.  Be aware that this medication may make you have nausea, vomiting, abdominal pain, or diarrhea.  This medication can also cause mood derangement. If you experience any side effects that you cannot tolerate, then you should stop taking the medication. Please make sure to take stay hydrated while you are taking this medication.  You should follow up with your primary healthcare provider within the next 3-5 days for reevaluation.  You will need to return to the emergency department immediately if you experience any of the following symptoms:  Difficulty breathing or shortness or breath Pain or pressure in the chest or abdomen Sudden dizziness Confusion Severe or persistent vomiting Flu-like symptoms that improve but then return with fever or worse cough  You should stay home for at least 24 hours after your fever is gone except to get medical care or other necessities. Your fever should be gone without the need to use a fever-reducing medicine, such as Tylenol or Motrin. Until then, you should stay home from work, school, travel, shopping, social events, and public gatherings.  Stay away from others as much as possible to keep from infecting them. If you must leave home, for example to get medical care, wear a facemask if you have one, or cover coughs and sneezes with a tissue. Wash your hands often to keep from spreading flu to others  

## 2018-10-23 NOTE — ED Notes (Signed)
Pt given three cups (8oz) water. Encouraged to drink.

## 2018-10-23 NOTE — ED Provider Notes (Signed)
[facility] EMERGENCY DEPARTMENT Provider Note   CSN: [id number] Arrival date & time: 10/23/18  1247    History   Chief Complaint Chief Complaint  Patient presents with  . Cough    dry cough non productive    HPI Michael Willis is a 43 y.o. male.     HPI   Pt is a 43 y/o male with a h/o diabetes, elevated LFTs, HTN, HLD, obesity, who presents to the ED today for evaluation of a dry cough that began last night. The patients denies sob or chest pain. No rhinorrhea, congestion, or sore throat. Has not taken anything for his sypmtoms today.    He denies chest pain or pain with inspiration. Denies BLE swelling.  States he ran out of his Janumet 2 weeks ago.  His wife does note that he has been urinating more frequently since he ran out of Janumet.  He reports sick contacts with similar sxs. Did not get flu shot.   Past Medical History:  Diagnosis Date  . Diabetes (HCC) 01/05/2013  . Elevated LFTs 01/05/2013  . HTN (hypertension) 12/12/2012  . Hyperlipidemia 12/04/2013  . Obesity   . Tinea pedis, onychomycosis 01/05/2013    Patient Active Problem List   Diagnosis Date Noted  . Swelling of right eyelid 02/04/2018  . Diabetic ketoacidosis without coma associated with type 2 diabetes mellitus (HCC)   . Elevated troponin   . Hyperkalemia   . PCP NOTES >>>>>>>>>>>>>>>>>>>>.Marland Kitchen 12/14/2015  . Hyperlipidemia 12/04/2013  . Tinea pedis, onychomycosis, feet deformity 01/05/2013  . Elevated LFTs 01/05/2013  . Annual physical exam 12/12/2012  . OSA (obstructive sleep apnea) 12/12/2012  . HTN (hypertension) 12/12/2012    Past Surgical History:  Procedure Laterality Date  . NO PAST SURGERIES          Home Medications    Prior to Admission medications   Medication Sig Start Date End Date Taking? Authorizing Provider  aspirin EC 81 MG EC tablet Take 1 tablet (81 mg total) by mouth daily. 06/17/16   Lonia Blood, MD  atorvastatin (LIPITOR) 20 MG tablet Take  1 tablet (20 mg total) by mouth at bedtime. 09/15/18   Wanda Plump, MD  glucose blood test strip Check blood sugar three times daily 11/29/16   Wanda Plump, MD  hydrochlorothiazide (MICROZIDE) 12.5 MG capsule Take 1 capsule (12.5 mg total) by mouth daily. 04/17/18   Wanda Plump, MD  insulin NPH Human (HUMULIN N,NOVOLIN N) 100 UNIT/ML injection 32 units before breakfast, 18 units before dinner 12/03/17   Wanda Plump, MD  INSULIN SYRINGE .5CC/28G 28G X 1/2" 0.5 ML MISC 1 Syringe by Does not apply route 2 (two) times daily. 06/16/16   Lonia Blood, MD  Lancets [facility] ULTRASOFT) lancets Check blood sugar three times daily 06/20/16   Wanda Plump, MD  lisinopril (PRINIVIL,ZESTRIL) 5 MG tablet Take 1 tablet (5 mg total) by mouth daily. 04/17/18   Wanda Plump, MD  oseltamivir (TAMIFLU) 75 MG capsule Take 1 capsule (75 mg total) by mouth every 12 (twelve) hours. 10/23/18   Ranesha Val S, PA-C  sitaGLIPtin-metformin (JANUMET) 50-1000 MG tablet Take 1 tablet by mouth 2 (two) times daily with a meal for 30 days. 10/23/18 11/22/18  Bayler Gehrig S, PA-C  terbinafine (LAMISIL) 250 MG tablet Take 1 tablet (250 mg total) by mouth daily. 01/16/18   Wanda Plump, MD    Family History Family History  Problem Relation Age  of Onset  . CAD Mother 67       MI?  . Diabetes Neg Hx   . Stroke Neg Hx   . Colon cancer Neg Hx   . Prostate cancer Neg Hx     Social History Social History   Tobacco Use  . Smoking status: Former Smoker    Years: 6.00    Types: Cigars    Last attempt to quit: 09/18/2000    Years since quitting: 18.1  . Smokeless tobacco: Never Used  . Tobacco comment: smoke a few cigars off an on .   Substance Use Topics  . Alcohol use: Yes    Comment: socially  . Drug use: No     Allergies   Patient has no known allergies.   Review of Systems Review of Systems  Constitutional: Positive for fatigue and fever.  HENT: Negative for ear pain and sore throat.   Eyes: Negative for visual  disturbance.  Respiratory: Positive for cough. Negative for shortness of breath.   Cardiovascular: Negative for chest pain.  Gastrointestinal: Negative for abdominal pain, constipation, diarrhea, nausea and vomiting.  Genitourinary: Negative for dysuria and hematuria.  Musculoskeletal: Negative for back pain.  Skin: Negative for rash.  Neurological: Negative for headaches.  All other systems reviewed and are negative.  Physical Exam Updated Vital Signs BP (!) 175/95   Pulse (!) 122   Temp (!) 100.6 F (38.1 C) (Oral)   Resp 16   Ht 5\' 10"  (1.778 m)   Wt (!) 136.5 kg   SpO2 95%   BMI 43.19 kg/m   Physical Exam Vitals signs and nursing note reviewed.  Constitutional:      Appearance: He is well-developed. He is obese.  HENT:     Head: Normocephalic and atraumatic.     Mouth/Throat:     Mouth: Mucous membranes are moist.     Pharynx: No oropharyngeal exudate or posterior oropharyngeal erythema.  Eyes:     Conjunctiva/sclera: Conjunctivae normal.  Neck:     Musculoskeletal: Neck supple.  Cardiovascular:     Rate and Rhythm: Regular rhythm. Tachycardia present.     Pulses: Normal pulses.     Heart sounds: Normal heart sounds. No murmur.  Pulmonary:     Effort: Pulmonary effort is normal. No respiratory distress.     Breath sounds: Normal breath sounds. No stridor. No wheezing or rhonchi.  Abdominal:     General: Bowel sounds are normal.     Palpations: Abdomen is soft.     Tenderness: There is no abdominal tenderness.  Musculoskeletal:        General: No tenderness.     Right lower leg: No edema.     Left lower leg: No edema.  Skin:    General: Skin is warm and dry.  Neurological:     Mental Status: He is alert.  Psychiatric:        Mood and Affect: Mood normal.      ED Treatments / Results  Labs (all labs ordered are listed, but only abnormal results are displayed) Labs Reviewed  CBC WITH DIFFERENTIAL/PLATELET - Abnormal; Notable for the following  components:      Result Value   WBC 10.8 (*)    Neutro Abs 8.9 (*)    Abs Immature Granulocytes 0.14 (*)    All other components within normal limits  BASIC METABOLIC PANEL - Abnormal; Notable for the following components:   Glucose, Bld 289 (*)    All other components  within normal limits  INFLUENZA PANEL BY PCR (TYPE A & B) - Abnormal; Notable for the following components:   Influenza A By PCR POSITIVE (*)    All other components within normal limits  URINALYSIS, ROUTINE W REFLEX MICROSCOPIC - Abnormal; Notable for the following components:   Glucose, UA >=500 (*)    Ketones, ur 5 (*)    All other components within normal limits  CBG MONITORING, ED - Abnormal; Notable for the following components:   Glucose-Capillary 285 (*)    All other components within normal limits    EKG EKG Interpretation  Date/Time:  Thursday October 23 2018 13:01:33 EST Ventricular Rate:  120 PR Interval:    QRS Duration: 68 QT Interval:  296 QTC Calculation: 419 R Axis:   65 Text Interpretation:  Sinus tachycardia Borderline T wave abnormalities Baseline wander in lead(s) V1 when compared to priorm similar sinus tacycardia.  No STEMI Confirmed by Theda Belfast ([id number]) on 10/23/2018 1:14:56 PM   Radiology Dg Chest 2 View  Result Date: 10/23/2018 CLINICAL DATA:  Cough EXAM: CHEST - 2 VIEW COMPARISON:  06/13/2016 FINDINGS: The heart size and mediastinal contours are within normal limits. Both lungs are clear. The visualized skeletal structures are unremarkable. IMPRESSION: No active cardiopulmonary disease. Electronically Signed   By: Jasmine Pang M.D.   On: 10/23/2018 14:58    Procedures Procedures (including critical care time)  Medications Ordered in ED Medications  hydrochlorothiazide (MICROZIDE) capsule 12.5 mg (12.5 mg Oral Given 10/23/18 1616)  lisinopril (PRINIVIL,ZESTRIL) tablet 5 mg (5 mg Oral Given 10/23/18 1612)  insulin aspart (novoLOG) injection 6 Units (has no administration in time  range)  sodium chloride 0.9 % bolus 1,000 mL (has no administration in time range)  acetaminophen (TYLENOL) tablet 650 mg (650 mg Oral Given 10/23/18 1403)  sodium chloride 0.9 % bolus 500 mL (0 mLs Intravenous Stopped 10/23/18 1610)  oseltamivir (TAMIFLU) capsule 75 mg (75 mg Oral Given 10/23/18 1502)  ibuprofen (ADVIL,MOTRIN) tablet 600 mg (600 mg Oral Given 10/23/18 1615)  sodium chloride 0.9 % bolus 500 mL (500 mLs Intravenous New Bag/Given 10/23/18 1619)     Initial Impression / Assessment and Plan / ED Course  I have reviewed the triage vital signs and the nursing notes.  Pertinent labs & imaging results that were available during my care of the patient were reviewed by me and considered in my medical decision making (see chart for details).       Final Clinical Impressions(s) / ED Diagnoses   Final diagnoses:  Influenza A   Presenting with cough and fever that began yesterday.  No shortness of breath or chest pain.  No other URI symptoms.  Does have recent sick contacts with similar symptoms.  He does also note that he ran out of Janumet 2 weeks ago and has had increased frequency with urination since then.  No other diet GI or GU symptoms.  Arrives tachycardic and febrile.  Elevated blood pressure.  Satting well on room air without respiratory distress.  No tachypnea.  Speaking full sentences.  Lung some clear to auscultation bilaterally.  Abdomen soft and nontender.  CBC with mild leukocytosis.  No anemia. BMP with elevated blood glucose at 289 but no elevated anion gap.  CO2 is normal.  Electrolytes are normal. UA glucosuria and very mild ketonuria Based on labs and UA, do not think that patient is in DKA at this time. Influenza testing is positive for flu a, he was given a dose of  Tamiflu in the ED.  Sinus tachycardia Borderline T wave abnormalities Baseline wander in lead(s) V1 when compared to priorm similar sinus tacycardia.  No STEMI Confirmed by Theda Belfastegeler, Chris ([id number]) on  10/23/2018 1:14:56 PM  CXR does not show any evidence of pneumonia.  Pt given tylenol and IVF and on re-eval he feels what improved but he remains tachycardic.  He continues to deny any shortness of breath or chest pain.  His temperature was repeated at 100.6.  Will give dose of ibuprofen and additional fluid bolus.  Patient is tolerating p.o. and has had several glasses of water.  Will give additional p.o. hydration and reassess.  [facility], PA-C with plan to follow-up on repeat vital signs after fluids.  If tachycardia resolves, feel the patient is safe for discharge home with Rx for Tamiflu and close follow-up with his PCP.  ED Discharge Orders         Ordered    sitaGLIPtin-metformin (JANUMET) 50-1000 MG tablet  2 times daily with meals     10/23/18 1656    oseltamivir (TAMIFLU) 75 MG capsule  Every 12 hours     10/23/18 1656           Yareli Carthen S, PA-C 10/23/18 1656    Tegeler, Canary Brimhristopher J, MD 10/24/18 [phone]

## 2018-10-23 NOTE — ED Triage Notes (Signed)
Pt started a dry cough yesterday evening, continued into today, cough getting worse, progressed to shallow rapid breathing. Cough nonproductive.

## 2018-10-23 NOTE — Progress Notes (Signed)
ED NCM spoke to pt and wife at bedside. Pt currently is not working and without insurance coverage. Wife states pt was on her insurance but he signed up for Medicaid which was on [employer] She has cancelled and plans to put him back on her Rosann Auerbach which should take effect in 1-2 weeks. Provided pt with Match for $3 copay and once per year use. Pt has PCP, PAZ, JOSE E. Wife states pt has his other meds at home. Isidoro Donning RN CCM Case Mgmt phone [phone]

## 2018-10-23 NOTE — ED Notes (Signed)
Patient verbalizes understanding of discharge instructions. Opportunity for questioning and answers were provided. Armband removed by staff, pt discharged from ED.  

## 2018-12-03 ENCOUNTER — Ambulatory Visit (INDEPENDENT_AMBULATORY_CARE_PROVIDER_SITE_OTHER): Payer: Medicaid Other | Admitting: Internal Medicine

## 2018-12-03 ENCOUNTER — Other Ambulatory Visit: Payer: Self-pay

## 2018-12-03 DIAGNOSIS — I10 Essential (primary) hypertension: Secondary | ICD-10-CM

## 2018-12-03 DIAGNOSIS — E119 Type 2 diabetes mellitus without complications: Secondary | ICD-10-CM

## 2018-12-03 DIAGNOSIS — E1159 Type 2 diabetes mellitus with other circulatory complications: Secondary | ICD-10-CM

## 2018-12-03 MED ORDER — SITAGLIPTIN PHOS-METFORMIN HCL 50-1000 MG PO TABS: 1.0000 | ORAL_TABLET | Freq: Two times a day (BID) | ORAL | 6 refills | Status: DC

## 2018-12-03 NOTE — Progress Notes (Signed)
Subjective:    Patient ID: Michael Willis, male    DOB: 1975/11/25, 43 y.o.   MRN: [id number]  DOS:  12/03/2018 Type of visit - description: Virtual Visit via Video Note  I connected with@ on 12/03/18 at  3:40 PM EDT by a video enabled telemedicine application and verified that I am speaking with the correct person using two identifiers.   THIS ENCOUNTER IS A VIRTUAL VISIT DUE TO COVID-19 - PATIENT WAS NOT SEEN IN THE OFFICE. PATIENT HAS CONSENTED TO VIRTUAL VISIT / TELEMEDICINE VISIT   Location of patient: home  Location of provider: office  I discussed the limitations of evaluation and management by telemedicine and the availability of in person appointments. The patient expressed understanding and agreed to proceed.  History of Present Illness: Follow-up The patient went to the ER with influenza a few weeks ago, he is completely back to normal. DM: Good compliance with NPH and Janumet.  He just ran out of Janumet yesterday and needs a refill. 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    Review of Systems  Denies fever chills No chest pain no difficulty breathing Past Medical History:  Diagnosis Date  . Diabetes (HCC) 01/05/2013  . Elevated LFTs 01/05/2013  . HTN (hypertension) 12/12/2012  . Hyperlipidemia 12/04/2013  . Obesity   . Tinea pedis, onychomycosis 01/05/2013    Past Surgical History:  Procedure Laterality Date  . NO PAST SURGERIES      Social History   Socioeconomic History  . Marital status: Single    Spouse name: Not on file  . Number of children: 5  . Years of education: Not on file  . Highest education level: Not on file  Occupational History  . Occupation: not working   Engineer, productionocial Needs  . Financial resource strain: Not on file  . Food insecurity:    Worry: Not on file   Inability: Not on file  . Transportation needs:    Medical: Not on file    Non-medical: Not on file  Tobacco Use  . Smoking status: Former Smoker    Years: 6.00    Types: Cigars    Last attempt to quit: 09/18/2000    Years since quitting: 18.2  . Smokeless tobacco: Never Used  . Tobacco comment: smoke a few cigars off an on .   Substance and Sexual Activity  . Alcohol use: Yes    Comment: socially  . Drug use: No  . Sexual activity: Not on file  Lifestyle  . Physical activity:    Days per week: Not on file    Minutes per session: Not on file  . Stress: Not on file  Relationships  . Social connections:    Talks on phone: Not on file    Gets together: Not on file    Attends religious service: Not on file    Active member of club or organization: Not on file    Attends meetings of clubs or organizations: Not on file    Relationship status: Not on file  . Intimate partner violence:    Fear of current or ex partner: Not on file    Emotionally abused: Not on file    Physically abused: Not on file    Forced sexual activity: Not on file  Other Topics Concern  . Not on file  Social History Narrative   Has a GF, live together .   Exercise: none   Diet: regular      Allergies as of 12/03/2018   No Known Allergies     Medication List       Accurate as of December 03, 2018  3:39 PM. Always use your most recent med list.        aspirin 81 MG EC tablet Take 1 tablet (81 mg total) by mouth daily.   atorvastatin 20 MG tablet Commonly known as:  LIPITOR Take 1 tablet (20 mg total) by mouth at bedtime.   glucose blood test strip Check blood sugar three times daily   hydrochlorothiazide 25 MG tablet Commonly known as:  HYDRODIURIL Take 25 mg by mouth daily.   hydrochlorothiazide 12.5 MG capsule Commonly known as:  MICROZIDE Take 1 capsule (12.5 mg total) by mouth daily.   INSULIN SYRINGE .5CC/28G 28G X 1/2" 0.5 ML Misc 1 Syringe by Does not apply route 2 (two) times  daily.   lisinopril 5 MG tablet Commonly known as:  PRINIVIL,ZESTRIL Take 1 tablet (5 mg total) by mouth daily.   onetouch ultrasoft lancets Check blood sugar three times daily   oseltamivir 75 MG capsule Commonly known as:  TAMIFLU Take 1 capsule (75 mg total) by mouth every 12 (twelve) hours.   sitaGLIPtin-metformin 50-1000 MG tablet Commonly known as:  Janumet Take 1 tablet by mouth 2 (two) times daily with a meal for 30 days.   terbinafine 250 MG tablet Commonly known as:  LamISIL Take 1 tablet (250 mg total) by mouth daily.           Objective:   Physical Exam There were no vitals taken for this visit. This is a video visit, alert oriented x3 his is vide in no apparent distress    Assessment     Assessment DM 2014 HTN Hyperlipidemia Elevated LFTs Morbid obesity Onychomycosis Snoring: suspect OSA, saw pulmonary 2014, did not purse sleep study  PLAN: DM: Currently on NPH 31 units in the morning and 16 at night.  Also on Janumet.  States that he now has insurance and will be able to afford it.  States that this medication makes a difference, when he ran out of Janumet he has polyuria.  Refill sent. Could not tell me his CBG readings. HTN: Currently on lisinopril hydrochlorothiazide.  Reports ambulatory BPs are very good but could not give me any readings.  BMP on the ER few weeks ago satisfactory Hyperlipidemia: On Lipitor COVID-19: Strongly recommend him to follow all the CDC guidelines, he is high risk due to medical problems including obesity.. Onychomycosis: Reports he took Lamisil and nails look better. Plan: Ideally, I would check a A1c but I do not like him to come to the office at this point. RTC 2 months, my staff will call and make an appointment.  We will do labs then.    I discussed the assessment and treatment plan with the patient. The patient was provided an opportunity to ask questions and all were answered. The patient agreed with the plan and  demonstrated an understanding of the instructions.   The patient was advised to call back or seek an in-person evaluation if the symptoms worsen or if the condition fails to improve as anticipated.

## 2018-12-04 DIAGNOSIS — E119 Type 2 diabetes mellitus without complications: Secondary | ICD-10-CM | POA: Insufficient documentation

## 2018-12-04 NOTE — Assessment & Plan Note (Signed)
DM: Currently on NPH 31 units in the morning and 16 at night.  Also on Janumet.  States that he now has insurance and will be able to afford it.  States that this medication makes a difference, when he ran out of Janumet he has polyuria.  Refill sent. Could not tell me his CBG readings. HTN: Currently on lisinopril hydrochlorothiazide.  Reports ambulatory BPs are very good but could not give me any readings.  BMP on the ER few weeks ago satisfactory Hyperlipidemia: On Lipitor COVID-19: Strongly recommend him to follow all the CDC guidelines, he is high risk due to medical problems including obesity.. Onychomycosis: Reports he took Lamisil and nails look better. Plan: Ideally, I would check a A1c but I do not like him to come to the office at this point. RTC 2 months, my staff will call and make an appointment.  We will do labs then.

## 2018-12-15 ENCOUNTER — Other Ambulatory Visit: Payer: Self-pay | Admitting: Internal Medicine

## 2018-12-15 DIAGNOSIS — I10 Essential (primary) hypertension: Secondary | ICD-10-CM

## 2019-03-09 ENCOUNTER — Other Ambulatory Visit: Payer: Self-pay

## 2019-03-09 DIAGNOSIS — I10 Essential (primary) hypertension: Secondary | ICD-10-CM

## 2019-03-09 MED ORDER — HYDROCHLOROTHIAZIDE 12.5 MG PO CAPS: 12.5000 mg | ORAL_CAPSULE | Freq: Every day | ORAL | 0 refills | Status: DC

## 2019-03-09 MED ORDER — LISINOPRIL 5 MG PO TABS: 5.0000 mg | ORAL_TABLET | Freq: Every day | ORAL | 0 refills | Status: DC

## 2019-04-22 ENCOUNTER — Other Ambulatory Visit: Payer: Self-pay | Admitting: Internal Medicine

## 2019-04-22 DIAGNOSIS — I10 Essential (primary) hypertension: Secondary | ICD-10-CM

## 2019-05-07 ENCOUNTER — Other Ambulatory Visit: Payer: Self-pay

## 2019-05-08 ENCOUNTER — Ambulatory Visit: Payer: Managed Care, Other (non HMO) | Admitting: Internal Medicine

## 2019-05-08 ENCOUNTER — Encounter: Payer: Self-pay | Admitting: Internal Medicine

## 2019-05-08 ENCOUNTER — Other Ambulatory Visit: Payer: Self-pay

## 2019-05-08 VITALS — BP 180/105 | HR 83 | Temp 96.9°F | Resp 16 | Ht 72.0 in | Wt 311.2 lb

## 2019-05-08 DIAGNOSIS — I10 Essential (primary) hypertension: Secondary | ICD-10-CM

## 2019-05-08 DIAGNOSIS — E785 Hyperlipidemia, unspecified: Secondary | ICD-10-CM

## 2019-05-08 DIAGNOSIS — E119 Type 2 diabetes mellitus without complications: Secondary | ICD-10-CM | POA: Diagnosis not present

## 2019-05-08 MED ORDER — LOSARTAN POTASSIUM-HCTZ 100-12.5 MG PO TABS: 1.0000 | ORAL_TABLET | Freq: Every day | ORAL | 3 refills | Status: DC

## 2019-05-08 MED ORDER — JANUMET 50-1000 MG PO TABS: 1.0000 | ORAL_TABLET | Freq: Two times a day (BID) | ORAL | 3 refills | Status: DC

## 2019-05-08 MED ORDER — ATORVASTATIN CALCIUM 20 MG PO TABS: 20.0000 mg | ORAL_TABLET | Freq: Every day | ORAL | 6 refills | Status: DC

## 2019-05-08 MED ORDER — INSULIN NPH (HUMAN) (ISOPHANE) 100 UNIT/ML ~~LOC~~ SUSP: SUBCUTANEOUS | 3 refills | Status: DC

## 2019-05-08 NOTE — Progress Notes (Signed)
Pre visit review using our clinic review tool, if applicable. No additional management support is needed unless otherwise documented below in the visit note. 

## 2019-05-08 NOTE — Patient Instructions (Addendum)
Per our records you are due for an eye exam. Please contact your eye doctor to schedule an appointment. Please have them send copies of your office visit notes to [place]. Our fax number is [phone].   GO TO THE FRONT DESK Schedule your next appointment   for a checkup in 3 weeks, fasting   =================  Diabetes: Check your blood sugar  once or twice a day    at different times of the day  GOALS: Fasting before a meal 70- 130 2 hours after a meal less than 180  avoid long term complications.   Check the  blood pressure 2 or 3 times a  Week  BP GOAL is between 110/65 and  135/85. If it is consistently higher or lower, let me know   ==================== Stop lisinopril Stop hydrochlorothiazide  Blood pressure medicines losartan HCT 100-12.5 mg 1 tablet daily  Cholesterol medication: Atorvastatin 1 every night  Diabetes medicines: Insulin twice a day Janumet twice a day  Take aspirin daily

## 2019-05-08 NOTE — Progress Notes (Signed)
Subjective:    Patient ID: Michael Willis, male    DOB: [birth year], 43 y.o.   MRN: [id number]  DOS:  05/08/2019 Type of visit - description: Follow-up HTN: Good med compliance, reports ambulatory BPs are okay but could not tell me any readings DM: Good med compliance, ambulatory CBGs are "good".  Could not tell me any readings except that sometimes it is in the 200s High cholesterol: Taking Lipitor?  Review of Systems Diet is reportedly good, he is active at work Denies fever chills No chest pain no difficulty breathing No nausea, vomiting, diarrhea Mild snoring, denies fatigue or feeling sleepy No lower extremity paresthesias  Past Medical History:  Diagnosis Date  . Diabetes (HCC) 01/05/2013  . Elevated LFTs 01/05/2013  . HTN (hypertension) 12/12/2012  . Hyperlipidemia 12/04/2013  . Obesity   . Tinea pedis, onychomycosis 01/05/2013    Past Surgical History:  Procedure Laterality Date  . NO PAST SURGERIES      Social History   Socioeconomic History  . Marital status: Single    Spouse name: Not on file  . Number of children: 5  . Years of education: Not on file  . Highest education level: Not on file  Occupational History  . Occupation: not working   Engineer, productionocial Needs  . Financial resource strain: Not on file  . Food insecurity    Worry: Not on file    Inability: Not on file  . Transportation needs    Medical: Not on file    Non-medical: Not on file  Tobacco Use  . Smoking status: Former Smoker    Years: 6.00    Types: Cigars    Quit date: 09/18/2000    Years since quitting: 18.6  . Smokeless tobacco: Never Used  . Tobacco comment: smoke a few cigars off an on .   Substance and Sexual Activity  . Alcohol use: Yes    Comment: socially  . Drug use: No  . Sexual activity: Not on file  Lifestyle  . Physical activity    Days per week: Not on file    Minutes per session: Not on file  . Stress: Not on file  Relationships  . Social Musicianconnections    Talks on phone:  Not on file    Gets together: Not on file    Attends religious service: Not on file    Active member of club or organization: Not on file    Attends meetings of clubs or organizations: Not on file    Relationship status: Not on file  . Intimate partner violence    Fear of current or ex partner: Not on file    Emotionally abused: Not on file    Physically abused: Not on file    Forced sexual activity: Not on file  Other Topics Concern  . Not on file  Social History Narrative   Has a GF, live together .   Exercise: none   Diet: regular      Allergies as of 05/08/2019   No Known Allergies     Medication List       Accurate as of May 08, 2019  9:31 AM. If you have any questions, ask your nurse or doctor.        STOP taking these medications   hydrochlorothiazide 12.5 MG capsule Commonly known as: MICROZIDE Stopped by: Willow OraJose Paz, MD   lisinopril 5 MG tablet Commonly known as: ZESTRIL Stopped by: Willow OraJose Paz, MD     TAKE  these medications   aspirin 81 MG EC tablet Take 1 tablet (81 mg total) by mouth daily.   atorvastatin 20 MG tablet Commonly known as: LIPITOR Take 1 tablet (20 mg total) by mouth at bedtime.   glucose blood test strip Check blood sugar three times daily   insulin NPH Human 100 UNIT/ML injection Commonly known as: NOVOLIN N 31 units before breakfast, 16 units before dinner   INSULIN SYRINGE .5CC/28G 28G X 1/2" 0.5 ML Misc 1 Syringe by Does not apply route 2 (two) times daily.   Janumet 50-1000 MG tablet Generic drug: sitaGLIPtin-metformin Take 1 tablet by mouth 2 (two) times daily with a meal.   losartan-hydrochlorothiazide 100-12.5 MG tablet Commonly known as: HYZAAR Take 1 tablet by mouth daily. Started by: Kathlene November, MD   onetouch ultrasoft lancets Check blood sugar three times daily           Objective:   Physical Exam BP (!) 188/99 (BP Location: Left Arm, Patient Position: Sitting, Cuff Size: Normal)   Pulse 83   Temp (!)  96.9 F (36.1 C) (Temporal)   Resp 16   Ht 6' (1.829 m)   Wt (!) 311 lb 4 oz (141.2 kg)   SpO2 91%   BMI 42.21 kg/m  General:   Well developed, NAD, BMI noted. HEENT:  Normocephalic . Face symmetric, atraumatic Lungs:  CTA B Normal respiratory effort, no intercostal retractions, no accessory muscle use. Heart: RRR,  no murmur.  No pretibial edema bilaterally  Skin: Not pale. Not jaundice DM foot exam: Good pedal pulses, no edema, pinprick examination normal, nails dystrophic Neurologic:  alert & oriented X3.  Speech normal, gait appropriate for age and unassisted Psych--  Cognition and judgment appear intact.  Cooperative with normal attention span and concentration.  Behavior appropriate. No anxious or depressed appearing.      Assessment      Assessment DM 2014 HTN Hyperlipidemia Elevated LFTs Morbid obesity Onychomycosis Snoring: suspect OSA, saw pulmonary 2014, did not purse sleep study  PLAN: DM: Good compliance with NPH and Janumet.  Ambulatory CBGs 200?  See HPI. Plan: Continue present care, check A1c on RTC HTN: On lisinopril and HCTZ, ambulatory BPs are "okay" but he could not tell me any reading.  BP today is elevated, I rechecked it myself 180/105. Plan: Switch to Hyzaar 100- 12.51 tablet daily.  Monitor BPs.  Needs prompt follow-up to be sure BP is getting back to normal with new medications.  Follow-up 3 weeks High cholesterol: Compliance with Lipitor?.  Prescription sent, encourage compliance. Snoring: Mild, no fatigue, no feeling sleepy. Med compliance: I extensively discussed his medication list, encourage compliance, encouraged to check BPs and CBGs in the ambulatory setting and write it down.  Bring the log. Declined flu shot. RTC 3 weeks   Today, I spent more than 25   min with the patient: >50% of the time counseling regards importance of medication compliance, going over each medication on his list, discussing importance of checking ambulatory  BPs - CBGs

## 2019-05-09 NOTE — Assessment & Plan Note (Signed)
DM: Good compliance with NPH and Janumet.  Ambulatory CBGs 200?  See HPI. Plan: Continue present care, check A1c on RTC HTN: On lisinopril and HCTZ, ambulatory BPs are "okay" but he could not tell me any reading.  BP today is elevated, I rechecked it myself 180/105. Plan: Switch to Hyzaar 100- 12.51 tablet daily.  Monitor BPs.  Needs prompt follow-up to be sure BP is getting back to normal with new medications.  Follow-up 3 weeks High cholesterol: Compliance with Lipitor?.  Prescription sent, encourage compliance. Snoring: Mild, no fatigue, no feeling sleepy. Med compliance: I extensively discussed his medication list, encourage compliance, encouraged to check BPs and CBGs in the ambulatory setting and write it down.  Bring the log. Declined flu shot. RTC 3 weeks

## 2019-05-28 ENCOUNTER — Other Ambulatory Visit: Payer: Self-pay

## 2019-05-29 ENCOUNTER — Encounter: Payer: Self-pay | Admitting: Internal Medicine

## 2019-05-29 ENCOUNTER — Ambulatory Visit (INDEPENDENT_AMBULATORY_CARE_PROVIDER_SITE_OTHER): Payer: Managed Care, Other (non HMO) | Admitting: Internal Medicine

## 2019-05-29 ENCOUNTER — Other Ambulatory Visit: Payer: Self-pay

## 2019-05-29 VITALS — BP 185/102 | HR 83 | Temp 95.8°F | Resp 16 | Ht 72.0 in | Wt 313.5 lb

## 2019-05-29 DIAGNOSIS — E119 Type 2 diabetes mellitus without complications: Secondary | ICD-10-CM

## 2019-05-29 DIAGNOSIS — I10 Essential (primary) hypertension: Secondary | ICD-10-CM

## 2019-05-29 DIAGNOSIS — E785 Hyperlipidemia, unspecified: Secondary | ICD-10-CM | POA: Diagnosis not present

## 2019-05-29 LAB — COMPREHENSIVE METABOLIC PANEL
ALT: 75 U/L — ABNORMAL HIGH (ref 0–53)
AST: 39 U/L — ABNORMAL HIGH (ref 0–37)
Albumin: 4.1 g/dL (ref 3.5–5.2)
Alkaline Phosphatase: 43 U/L (ref 39–117)
BUN: 14 mg/dL (ref 6–23)
CO2: 32 mEq/L (ref 19–32)
Calcium: 9.5 mg/dL (ref 8.4–10.5)
Chloride: 101 mEq/L (ref 96–112)
Creatinine, Ser: 1.1 mg/dL (ref 0.40–1.50)
GFR: 88.59 mL/min (ref >60.00)
Glucose, Bld: 157 mg/dL — ABNORMAL HIGH (ref 70–99)
Potassium: 3.8 mEq/L (ref 3.5–5.1)
Sodium: 140 mEq/L (ref 135–145)
Total Bilirubin: 0.5 mg/dL (ref 0.2–1.2)
Total Protein: 6.9 g/dL (ref 6.0–8.3)

## 2019-05-29 LAB — CBC WITH DIFFERENTIAL/PLATELET
Basophils Absolute: 0 10*3/uL (ref 0.0–0.1)
Basophils Relative: 0.3 % (ref 0.0–3.0)
Eosinophils Absolute: 0.2 10*3/uL (ref 0.0–0.7)
Eosinophils Relative: 2.2 % (ref 0.0–5.0)
HCT: 41.2 % (ref 39.0–52.0)
Hemoglobin: 13.7 g/dL (ref 13.0–17.0)
Lymphocytes Relative: 29.2 % (ref 12.0–46.0)
Lymphs Abs: 2.9 10*3/uL (ref 0.7–4.0)
MCHC: 33.4 g/dL (ref 30.0–36.0)
MCV: 88.4 fl (ref 78.0–100.0)
Monocytes Absolute: 0.9 10*3/uL (ref 0.1–1.0)
Monocytes Relative: 9.1 % (ref 3.0–12.0)
Neutro Abs: 5.8 10*3/uL (ref 1.4–7.7)
Neutrophils Relative %: 59.2 % (ref 43.0–77.0)
Platelets: 201 10*3/uL (ref 150.0–400.0)
RBC: 4.66 Mil/uL (ref 4.22–5.81)
RDW: 13.3 % (ref 11.5–15.5)
WBC: 9.8 10*3/uL (ref 4.0–10.5)

## 2019-05-29 LAB — LIPID PANEL
Cholesterol: 180 mg/dL (ref 0–200)
HDL: 37.3 mg/dL — ABNORMAL LOW (ref >39.00)
NonHDL: 143.19
Total CHOL/HDL Ratio: 5
Triglycerides: 218 mg/dL — ABNORMAL HIGH (ref 0.0–149.0)
VLDL: 43.6 mg/dL — ABNORMAL HIGH (ref 0.0–40.0)

## 2019-05-29 LAB — HEMOGLOBIN A1C: Hgb A1c MFr Bld: 7.9 % — ABNORMAL HIGH (ref 4.6–6.5)

## 2019-05-29 LAB — LDL CHOLESTEROL, DIRECT: Direct LDL: 114 mg/dL

## 2019-05-29 MED ORDER — AMLODIPINE BESYLATE 10 MG PO TABS: 10.0000 mg | ORAL_TABLET | Freq: Every day | ORAL | 3 refills | Status: DC

## 2019-05-29 MED ORDER — BLOOD GLUCOSE METER KIT: PACK | 0 refills | Status: DC

## 2019-05-29 NOTE — Progress Notes (Signed)
Pre visit review using our clinic review tool, if applicable. No additional management support is needed unless otherwise documented below in the visit note. 

## 2019-05-29 NOTE — Progress Notes (Signed)
Subjective:    Patient ID: Michael Willis, male    DOB: 10/19/1975, 43 y.o.   MRN: [id number]  DOS:  05/29/2019 Type of visit - description: Follow-up Reports he is taking his medications as recommended No ambulatory BPs No ambulatory CBGs   Wt Readings from Last 3 Encounters:  05/29/19 (!) 313 lb 8 oz (142.2 kg)  05/08/19 (!) 311 lb 4 oz (141.2 kg)  10/23/18 (!) 301 lb (136.5 kg)     Review of Systems Denies chest pain difficulty breathing Reports that he has changed his diet "I am not eating bread, sometimes I skip a meal". He remains active at work.  Past Medical History:  Diagnosis Date  . Diabetes ([place]) 01/05/2013  . Elevated LFTs 01/05/2013  . HTN (hypertension) 12/12/2012  . Hyperlipidemia 12/04/2013  . Obesity   . Tinea pedis, onychomycosis 01/05/2013    Past Surgical History:  Procedure Laterality Date  . NO PAST SURGERIES      Social History   Socioeconomic History  . Marital status: Single    Spouse name: Not on file  . Number of children: 5  . Years of education: Not on file  . Highest education level: Not on file  Occupational History  . 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 Financial resource strain: Not on file  . Food insecurity    Worry: Not on file    Inability: Not on file  . Transportation needs    Medical: Not on file    Non-medical: Not on file  Tobacco Use  . Smoking status: Former Smoker    Years: 6.00    Types: Cigars    Quit date: 09/18/2000    Years since quitting: 18.7  . Smokeless tobacco: Never Used  . Tobacco comment: smoke a few cigars off an on .   Substance and Sexual Activity  . Alcohol use: Yes    Comment: socially  . Drug use: No  . Sexual activity: Not on file  Lifestyle  . Physical activity    Days per week: Not on file    Minutes per session: Not on file  . Stress: Not on file  Relationships  . Social Herbalist on phone: Not on file    Gets together: Not on file    Attends religious  service: Not on file    Active member of club or organization: Not on file    Attends meetings of clubs or organizations: Not on file    Relationship status: Not on file  . Intimate partner violence    Fear of current or ex partner: Not on file    Emotionally abused: Not on file    Physically abused: Not on file    Forced sexual activity: Not on file  Other Topics Concern  . Not on file  Social History Narrative   Has a GF, live together .   Exercise: none   Diet: regular      Allergies as of 05/29/2019   No Known Allergies     Medication List       Accurate as of May 29, 2019  8:41 AM. If you have any questions, ask your nurse or doctor.        aspirin 81 MG EC tablet Take 1 tablet (81 mg total) by mouth daily.   atorvastatin 20 MG tablet Commonly known as: LIPITOR Take 1 tablet (20 mg total) by mouth at bedtime.   glucose blood  test strip Check blood sugar three times daily   insulin NPH Human 100 UNIT/ML injection Commonly known as: NOVOLIN N 31 units before breakfast, 16 units before dinner   INSULIN SYRINGE .5CC/28G 28G X 1/2" 0.5 ML Misc 1 Syringe by Does not apply route 2 (two) times daily.   Janumet 50-1000 MG tablet Generic drug: sitaGLIPtin-metformin Take 1 tablet by mouth 2 (two) times daily with a meal.   losartan-hydrochlorothiazide 100-12.5 MG tablet Commonly known as: HYZAAR Take 1 tablet by mouth daily.   onetouch ultrasoft lancets Check blood sugar three times daily           Objective:   Physical Exam BP (!) 185/102 (BP Location: Left Arm, Patient Position: Sitting, Cuff Size: Normal)   Pulse 83   Temp (!) 95.8 F (35.4 C) (Temporal)   Resp 16   Ht 6' (1.829 m)   Wt (!) 313 lb 8 oz (142.2 kg)   SpO2 100%   BMI 42.52 kg/m  General:   Well developed, NAD, BMI noted. HEENT:  Normocephalic . Face symmetric, atraumatic Lungs:  CTA B Normal respiratory effort, no intercostal retractions, no accessory muscle use. Heart:  RRR,  no murmur.  No pretibial edema bilaterally  Skin: Not pale. Not jaundice Neurologic:  alert & oriented X3.  Speech normal, gait appropriate for age and unassisted Psych--  Cognition and judgment appear intact.  Cooperative with normal attention span and concentration.  Behavior appropriate. No anxious or depressed appearing.      Assessment    Assessment DM 2014 HTN Hyperlipidemia Elevated LFTs Morbid obesity Onychomycosis Snoring: suspect OSA, saw pulmonary 2014, did not purse sleep study  PLAN: DM: Reports good compliance with Janumet, insulin.  Check A1c.  I strongly recommend to go to the wellness clinic to help him lose weight (contact numbers provided), if he is not better , will need endocrinology referral.  He said he probably will not do that. Recommend to check CBGs, printed prescription for supplies provided HTN: Reports good compliance with Hyzaar, no ambulatory BPs despite my advice (see last AVS), BP today is elevated, add amlodipine.  Check CMP and CBC  High cholesterol: Reports taking Lipitor, check FLP Flu shot: Declined Compliance: Strongly recommend to follow my advice, explained him the importance of get his blood sugar and BP under control.  He is frustrated about the fact that needs more meds, however I explained that most patients need multiple medicines to control BPs and CBGs.   Explained that weight gain in the last few months has to do with increasing BP. He shook his head, was very skeptical of my recommendations RTC 6 weeks  Today, I spent more than  25  min with the patient: >50% of the time counseling regards importance of BP, DM control, risk associated with good control.  Also discussing the role of lifestyle in controlling his  medical problems.

## 2019-05-29 NOTE — Patient Instructions (Addendum)
Per our records you are due for an eye exam. Please contact your eye doctor to schedule an appointment. Please have them send copies of your office visit notes to [place]. Our fax number is [phone].   GO TO THE LAB : Get the blood work     GO TO THE FRONT DESK Schedule your next appointment   in 6 weeks    Continue the same medications and add amlodipine 10 mg 1 tablet every day.  This is a blood pressure medication  Check the  blood pressure 2 times a week  BP GOAL is between 110/65 and  135/85. If it is consistently higher or lower, let me know   Diabetes: Check your blood sugar 2 times a day Check your blood sugar  at different times of the day GOALS: Fasting before a meal 70- 130 2 hours after a meal less than 180

## 2019-05-31 NOTE — Assessment & Plan Note (Signed)
DM: Reports good compliance with Janumet, insulin.  Check A1c.  I strongly recommend to go to the wellness clinic to help him lose weight (contact numbers provided), if he is not better , will need endocrinology referral.  He said he probably will not do that. Recommend to check CBGs, printed prescription for supplies provided HTN: Reports good compliance with Hyzaar, no ambulatory BPs despite my advice (see last AVS), BP today is elevated, add amlodipine.  Check CMP and CBC  High cholesterol: Reports taking Lipitor, check FLP Flu shot: Declined Compliance: Strongly recommend to follow my advice, explained him the importance of get his blood sugar and BP under control.  He is frustrated about the fact that needs more meds, however I explained that most patients need multiple medicines to control BPs and CBGs.   Explained that weight gain in the last few months has to do with increasing BP. He shook his head, was very skeptical of my recommendations RTC 6 weeks

## 2019-06-04 NOTE — Addendum Note (Signed)
Addended byDamita Dunnings D on: 06/04/2019 04:35 PM   Modules accepted: Orders

## 2019-07-08 ENCOUNTER — Other Ambulatory Visit: Payer: Self-pay

## 2019-07-09 ENCOUNTER — Ambulatory Visit: Payer: Managed Care, Other (non HMO) | Admitting: Internal Medicine

## 2019-07-09 ENCOUNTER — Encounter: Payer: Self-pay | Admitting: Internal Medicine

## 2019-07-09 VITALS — BP 146/92 | HR 90 | Temp 97.1°F | Resp 18 | Ht 72.0 in | Wt 310.0 lb

## 2019-07-09 DIAGNOSIS — I10 Essential (primary) hypertension: Secondary | ICD-10-CM

## 2019-07-09 DIAGNOSIS — Z0100 Encounter for examination of eyes and vision without abnormal findings: Secondary | ICD-10-CM

## 2019-07-09 DIAGNOSIS — E119 Type 2 diabetes mellitus without complications: Secondary | ICD-10-CM | POA: Diagnosis not present

## 2019-07-09 NOTE — Progress Notes (Signed)
Pre visit review using our clinic review tool, if applicable. No additional management support is needed unless otherwise documented below in the visit note. 

## 2019-07-09 NOTE — Patient Instructions (Addendum)
Per our records you are due for an eye exam. Please contact your eye doctor to schedule an appointment. Please have them send copies of your office visit notes to [place]. Our fax number is [phone].    GO TO THE FRONT DESK Schedule your next appointment   For a check up in 2 months

## 2019-07-09 NOTE — Progress Notes (Signed)
Subjective:    Patient ID: Michael Willis, male    DOB: 09-06-1975, 43 y.o.   MRN: [id number]  DOS:  07/09/2019 Type of visit - description: Routine office visit Since the last time he was here, he is doing better with diet. He is checking his blood sugar randomly and they are around 135, 165.  Mostly in the 160s.  These numbers are both in the morning or afternoon.  Wt Readings from Last 3 Encounters:  07/09/19 (!) 310 lb (140.6 kg)  05/29/19 (!) 313 lb 8 oz (142.2 kg)  05/08/19 (!) 311 lb 4 oz (141.2 kg)     Review of Systems No chest pain, difficulty breathing.  No edema No nausea, vomiting, diarrhea No visual disturbances No low CBGs symptoms  Past Medical History:  Diagnosis Date  . Diabetes ([place]) 01/05/2013  . Elevated LFTs 01/05/2013  . HTN (hypertension) 12/12/2012  . Hyperlipidemia 12/04/2013  . Obesity   . Tinea pedis, onychomycosis 01/05/2013    Past Surgical History:  Procedure Laterality Date  . NO PAST SURGERIES      Social History   Socioeconomic History  . Marital status: Married    Spouse name: Not on file  . Number of children: 5  . Years of education: Not on file  . Highest education level: Not on file  Occupational History  . Occupation: yard work  Scientific laboratory technician  . Financial resource strain: Not on file  . Food insecurity    Worry: Not on file    Inability: Not on file  . Transportation needs    Medical: Not on file    Non-medical: Not on file  Tobacco Use  . Smoking status: Former Smoker    Years: 6.00    Types: Cigars    Quit date: 09/18/2000    Years since quitting: 18.8  . Smokeless tobacco: Never Used  . Tobacco comment: smoke a few cigars off an on .   Substance and Sexual Activity  . Alcohol use: Yes    Comment: socially  . Drug use: No  . Sexual activity: Not on file  Lifestyle  . Physical activity    Days per week: Not on file    Minutes per session: Not on file  . Stress: Not on file  Relationships  . Social  Herbalist on phone: Not on file    Gets together: Not on file    Attends religious service: Not on file    Active member of club or organization: Not on file    Attends meetings of clubs or organizations: Not on file    Relationship status: Not on file  . Intimate partner violence    Fear of current or ex partner: Not on file    Emotionally abused: Not on file    Physically abused: Not on file    Forced sexual activity: Not on file  Other Topics Concern  . Not on file  Social History Narrative   Married , household: pt, wife, wife's son   Exercise: none   Diet: regular      Allergies as of 07/09/2019   No Known Allergies     Medication List       Accurate as of July 09, 2019 11:59 PM. If you have any questions, ask your nurse or doctor.        amLODipine 10 MG tablet Commonly known as: NORVASC Take 1 tablet (10 mg total) by mouth daily.  aspirin 81 MG EC tablet Take 1 tablet (81 mg total) by mouth daily.   atorvastatin 20 MG tablet Commonly known as: LIPITOR Take 1 tablet (20 mg total) by mouth at bedtime.   blood glucose meter kit and supplies Dispense based on patient and insurance preference. Check blood sugar three times daily. DX: E11.9   glucose blood test strip Check blood sugar three times daily   insulin NPH Human 100 UNIT/ML injection Commonly known as: NOVOLIN N 36 units before breakfast, 21 units before dinner What changed: additional instructions Changed by: Kathlene November, MD   INSULIN SYRINGE .5CC/28G 28G X 1/2" 0.5 ML Misc 1 Syringe by Does not apply route 2 (two) times daily.   Janumet 50-1000 MG tablet Generic drug: sitaGLIPtin-metformin Take 1 tablet by mouth 2 (two) times daily with a meal.   losartan-hydrochlorothiazide 100-12.5 MG tablet Commonly known as: HYZAAR Take 1 tablet by mouth daily.   onetouch ultrasoft lancets Check blood sugar three times daily           Objective:   Physical Exam BP (!) 146/92 (BP  Location: Left Arm, Patient Position: Sitting, Cuff Size: Large)   Pulse 90   Temp (!) 97.1 F (36.2 C) (Temporal)   Resp 18   Ht 6' (1.829 m)   Wt (!) 310 lb (140.6 kg)   SpO2 98%   BMI 42.04 kg/m  General:   Well developed, NAD, BMI noted. HEENT:  Normocephalic . Face symmetric, atraumatic Lungs:  CTA B Normal respiratory effort, no intercostal retractions, no accessory muscle use. Heart: RRR,  no murmur.  No pretibial edema bilaterally  Skin: Not pale. Not jaundice Neurologic:  alert & oriented X3.  Speech normal, gait appropriate for age and unassisted Psych--  Cognition and judgment appear intact.  Cooperative with normal attention span and concentration.  Behavior appropriate. No anxious or depressed appearing.      Assessment     Assessment DM 2014 HTN Hyperlipidemia Elevated LFTs Morbid obesity Onychomycosis Snoring: suspect OSA, saw pulmonary 2014, did not purse sleep study  PLAN: DM: Since the last visit he is doing better with diet, has lost 3 pounds. Praised! He remains active, doing yard work. Continue Janumet. NPH: Increase from 31/16 to 36/21.  Low CBGs symptoms reviewed. Refer to ophthalmology. HTN: Not at goal, recheck on RTC High cholesterol: Continue Lipitor, not at goal, recheck on RTC Flu shot: Strongly declined RTC 2 months

## 2019-07-11 NOTE — Assessment & Plan Note (Signed)
DM: Since the last visit he is doing better with diet, has lost 3 pounds. Praised! He remains active, doing yard work. Continue Janumet. NPH: Increase from 31/16 to 36/21.  Low CBGs symptoms reviewed. Refer to ophthalmology. HTN: Not at goal, recheck on RTC High cholesterol: Continue Lipitor, not at goal, recheck on RTC Flu shot: Strongly declined RTC 2 months

## 2019-08-27 ENCOUNTER — Ambulatory Visit: Payer: Managed Care, Other (non HMO) | Admitting: Endocrinology

## 2019-09-07 ENCOUNTER — Ambulatory Visit: Payer: Managed Care, Other (non HMO) | Admitting: Internal Medicine

## 2019-10-03 ENCOUNTER — Other Ambulatory Visit: Payer: Self-pay | Admitting: Internal Medicine

## 2019-10-05 ENCOUNTER — Ambulatory Visit: Payer: Self-pay | Admitting: Internal Medicine

## 2019-10-28 ENCOUNTER — Other Ambulatory Visit: Payer: Self-pay

## 2019-10-29 ENCOUNTER — Ambulatory Visit: Payer: Self-pay | Admitting: Internal Medicine

## 2019-11-02 ENCOUNTER — Ambulatory Visit: Payer: Self-pay | Admitting: Internal Medicine

## 2019-11-02 DIAGNOSIS — Z0289 Encounter for other administrative examinations: Secondary | ICD-10-CM

## 2019-11-12 ENCOUNTER — Other Ambulatory Visit: Payer: Self-pay | Admitting: Internal Medicine

## 2019-11-15 IMAGING — DX DG CHEST 2V
2 series · 2 of 2 positions shown · non-contrast
Comparison: 06/13/2016

CLINICAL DATA: Cough

EXAM:
CHEST - 2 VIEW

[chest pa]
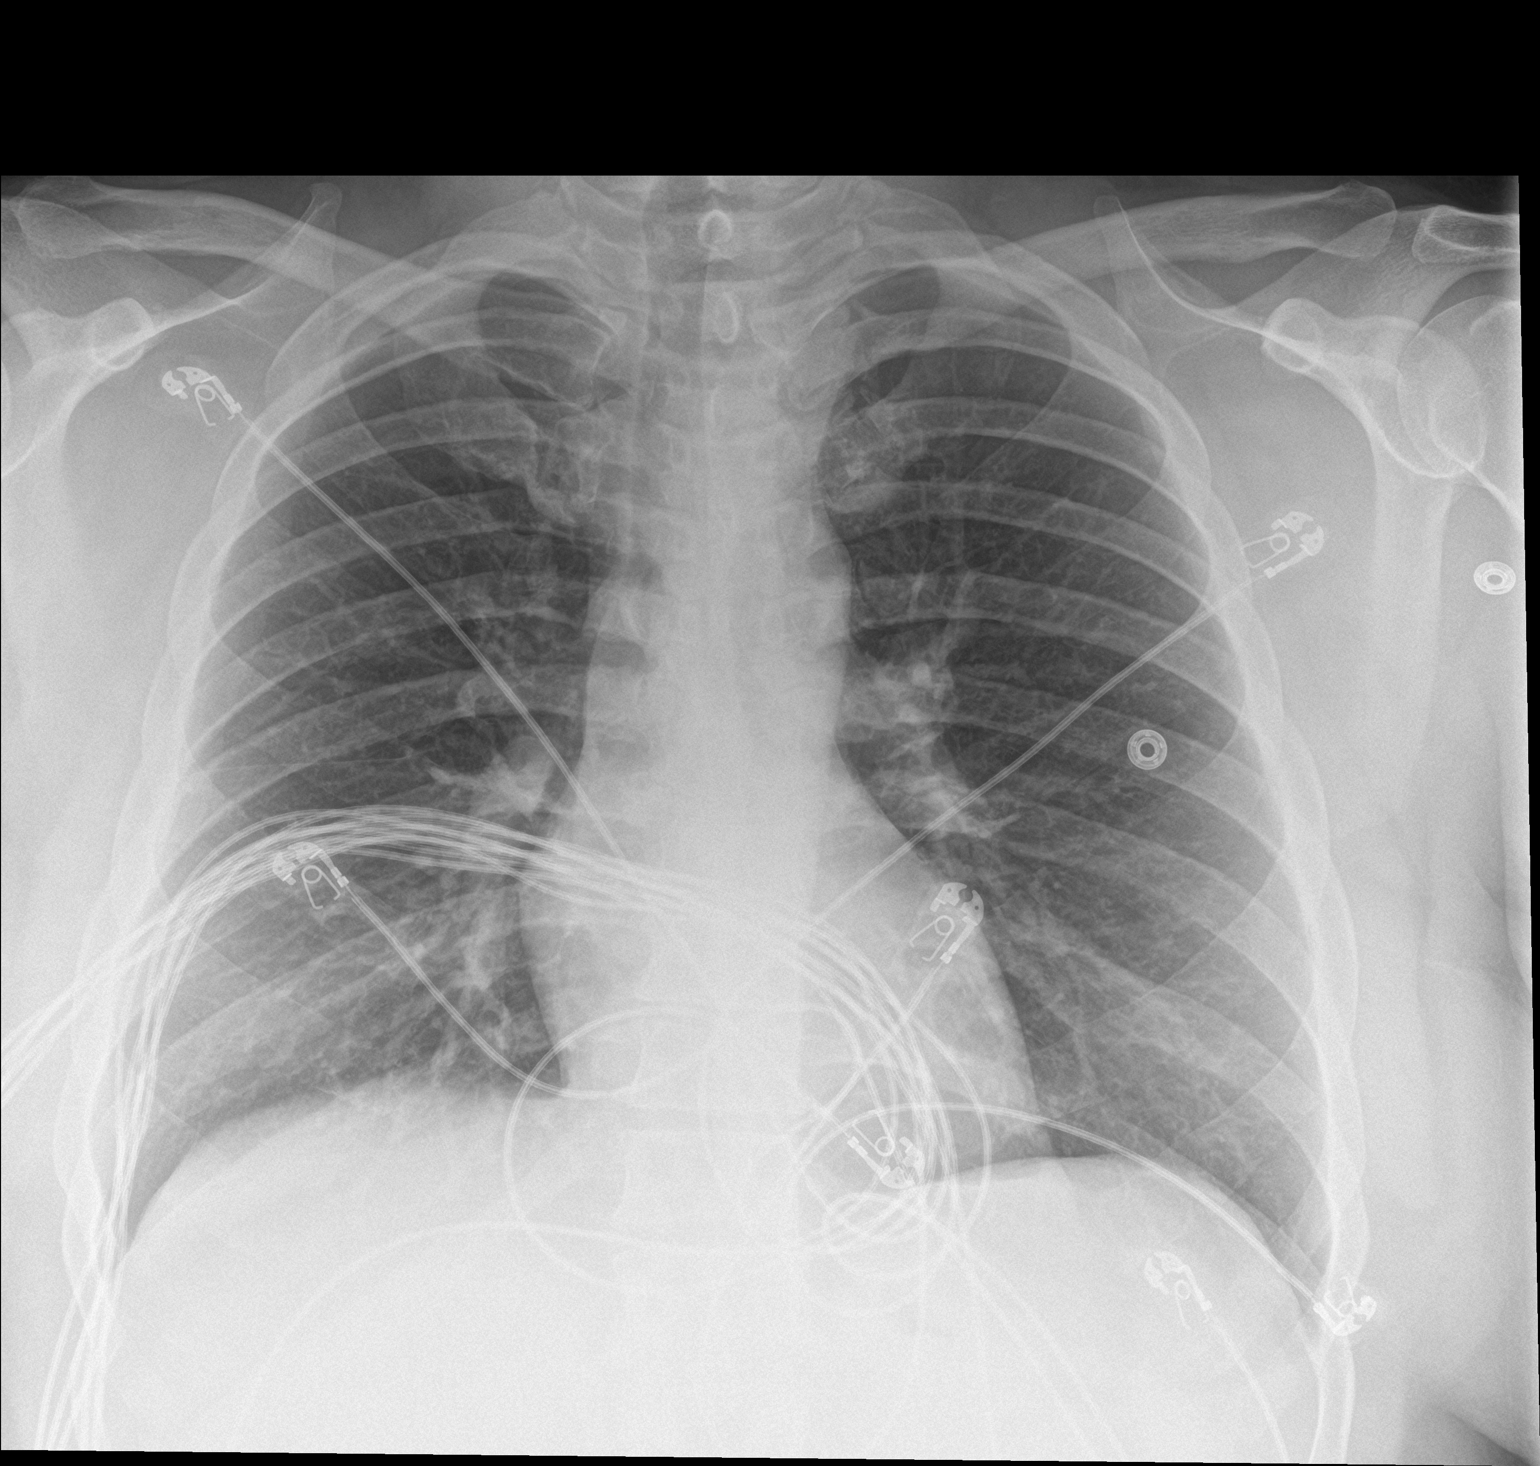

[chest lat]
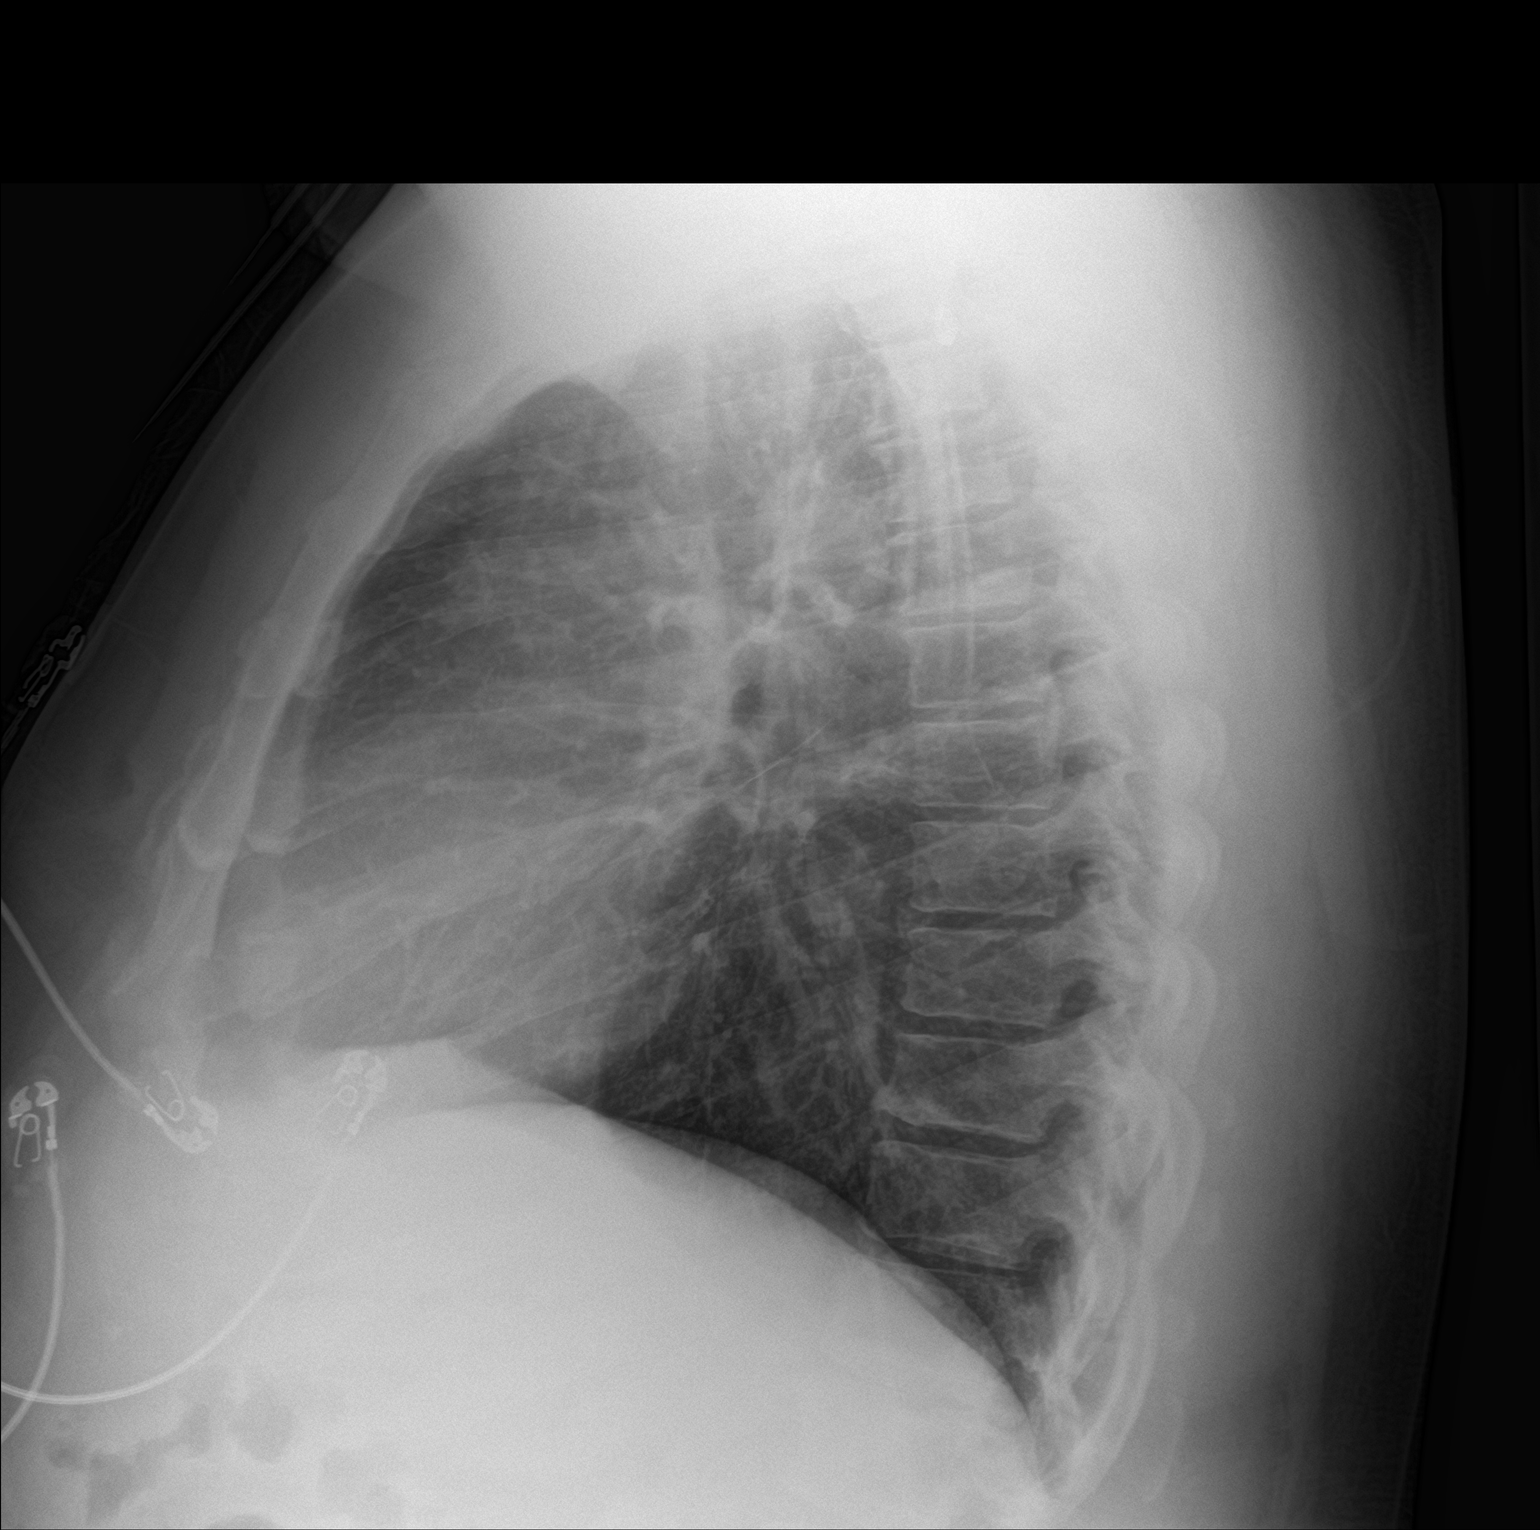

[2 of 2 positions shown; findings below may reference images not displayed]

FINDINGS: The heart size and mediastinal contours are within normal limits.
Both lungs are clear. The visualized skeletal structures are
unremarkable.
IMPRESSION: No active cardiopulmonary disease.

## 2020-01-04 ENCOUNTER — Other Ambulatory Visit: Payer: Self-pay | Admitting: Internal Medicine

## 2020-02-05 ENCOUNTER — Encounter: Payer: Self-pay | Admitting: Family Medicine

## 2020-02-05 ENCOUNTER — Other Ambulatory Visit: Payer: Self-pay

## 2020-02-05 ENCOUNTER — Ambulatory Visit: Payer: BC Managed Care – PPO | Admitting: Family Medicine

## 2020-02-05 ENCOUNTER — Other Ambulatory Visit (HOSPITAL_COMMUNITY)
Admission: RE | Admit: 2020-02-05 | Discharge: 2020-02-05 | Disposition: A | Discharge status: Home / Self Care | Payer: BC Managed Care – PPO | Source: Ambulatory Visit | Attending: Family Medicine | Admitting: Family Medicine

## 2020-02-05 VITALS — BP 168/102 | HR 89 | Temp 97.6°F | Ht 72.0 in | Wt 295.6 lb

## 2020-02-05 DIAGNOSIS — Z7721 Contact with and (suspected) exposure to potentially hazardous body fluids: Secondary | ICD-10-CM

## 2020-02-05 DIAGNOSIS — Z113 Encounter for screening for infections with a predominantly sexual mode of transmission: Secondary | ICD-10-CM | POA: Diagnosis not present

## 2020-02-05 DIAGNOSIS — Z114 Encounter for screening for human immunodeficiency virus [HIV]: Secondary | ICD-10-CM | POA: Diagnosis not present

## 2020-02-05 DIAGNOSIS — Z202 Contact with and (suspected) exposure to infections with a predominantly sexual mode of transmission: Secondary | ICD-10-CM | POA: Diagnosis not present

## 2020-02-05 DIAGNOSIS — I10 Essential (primary) hypertension: Secondary | ICD-10-CM | POA: Diagnosis not present

## 2020-02-05 MED ORDER — METRONIDAZOLE 250 MG PO TABS: ORAL_TABLET | ORAL | 0 refills | Status: DC

## 2020-02-05 NOTE — Progress Notes (Signed)
Established Patient Office Visit  Subjective:  Patient ID: Michael Willis, male    DOB: [birth year]  Age: 44 y.o. MRN: [id number]  CC:  Chief Complaint  Patient presents with  . Exposure to STD    Possible exposure to STD patient found out today from a partner he had unprotected intercourse with.     HPI Michael Willis presents for evaluation after body fluid exposure. Sexual partner informed him that she tested positive for trichomonas. Patient is currently having no symptoms and denies discharge burning rash fever or chills. Currently seeing Michael Willis for his hypertension diabetes and elevated cholesterol. Patient made the decision to discontinue all medications himself. He has been losing weight and actually feels better he tells me.  Past Medical History:  Diagnosis Date  . Diabetes ([place]) 01/05/2013  . Elevated LFTs 01/05/2013  . HTN (hypertension) 12/12/2012  . Hyperlipidemia 12/04/2013  . Obesity   . Tinea pedis, onychomycosis 01/05/2013    Past Surgical History:  Procedure Laterality Date  . NO PAST SURGERIES      Family History  Problem Relation Age of Onset  . CAD Mother 3       MI?  . Diabetes Neg Hx   . Stroke Neg Hx   . Colon cancer Neg Hx   . Prostate cancer Neg Hx     Social History   Socioeconomic History  . Marital status: Married    Spouse name: Not on file  . Number of children: 5  . Years of education: Not on file  . Highest education level: Not on file  Occupational History  . Occupation: yard work  Tobacco Use  . Smoking status: Former Smoker    Years: 6.00    Types: Cigars    Quit date: 09/18/2000    Years since quitting: 19.3  . Smokeless tobacco: Never Used  . Tobacco comment: smoke a few cigars off an on .   Substance and Sexual Activity  . Alcohol use: Yes    Comment: socially  . Drug use: No  . Sexual activity: Not on file  Other Topics Concern  . Not on file  Social History Narrative   Married , household: pt, wife, wife's son    Exercise: none   Diet: regular   Social Determinants of Health   Financial Resource Strain:   . Difficulty of Paying Living Expenses:   Food Insecurity:   . Worried About Charity fundraiser in the Last Year:   . Arboriculturist in the Last Year:   Transportation Needs:   . Film/video editor (Medical):   Marland Kitchen Lack of Transportation (Non-Medical):   Physical Activity:   . Days of Exercise per Week:   . Minutes of Exercise per Session:   Stress:   . Feeling of Stress :   Social Connections:   . Frequency of Communication with Friends and Family:   . Frequency of Social Gatherings with Friends and Family:   . Attends Religious Services:   . Active Member of Clubs or Organizations:   . Attends Archivist Meetings:   Marland Kitchen Marital Status:   Intimate Partner Violence:   . Fear of Current or Ex-Partner:   . Emotionally Abused:   Marland Kitchen Physically Abused:   . Sexually Abused:     Outpatient Medications Prior to Visit  Medication Sig Dispense Refill  . blood glucose meter kit and supplies Dispense based on patient and insurance preference. Check blood sugar  three times daily. DX: E11.9 1 each 0  . glucose blood test strip Check blood sugar three times daily 300 each 12  . Lancets (ONETOUCH ULTRASOFT) lancets Check blood sugar three times daily 300 each 12  . amLODipine (NORVASC) 10 MG tablet Take 1 tablet (10 mg total) by mouth daily. (Patient not taking: Reported on 02/05/2020) 30 tablet 0  . aspirin EC 81 MG EC tablet Take 1 tablet (81 mg total) by mouth daily. (Patient not taking: Reported on 02/05/2020)    . atorvastatin (LIPITOR) 20 MG tablet Take 1 tablet (20 mg total) by mouth at bedtime. (Patient not taking: Reported on 02/05/2020) 30 tablet 6  . insulin NPH Human (NOVOLIN N) 100 UNIT/ML injection 36 units before breakfast, 21 units before dinner (Patient not taking: Reported on 02/05/2020) 10 mL 3  . INSULIN SYRINGE .5CC/28G 28G X 1/2" 0.5 ML MISC 1 Syringe by Does not apply  route 2 (two) times daily. (Patient not taking: Reported on 02/05/2020) 200 each 1  . losartan-hydrochlorothiazide (HYZAAR) 100-12.5 MG tablet Take 1 tablet by mouth daily. (Patient not taking: Reported on 02/05/2020) 30 tablet 1  . sitaGLIPtin-metformin (JANUMET) 50-1000 MG tablet Take 1 tablet by mouth 2 (two) times daily with a meal. (Patient not taking: Reported on 02/05/2020) 60 tablet 3   No facility-administered medications prior to visit.    No Known Allergies  ROS Review of Systems  Constitutional: Negative.   Eyes: Negative for photophobia and visual disturbance.  Respiratory: Negative.   Cardiovascular: Negative.   Gastrointestinal: Negative.   Endocrine: Negative for polyphagia and polyuria.  Genitourinary: Negative for difficulty urinating, discharge, dysuria, frequency, genital sores, hematuria and urgency.  Skin: Negative for pallor and rash.  Neurological: Negative for headaches.  Hematological: Does not bruise/bleed easily.  Psychiatric/Behavioral: Negative.       Objective:    Physical Exam Constitutional:      General: He is not in acute distress.    Appearance: Normal appearance. He is obese. He is not ill-appearing, toxic-appearing or diaphoretic.  HENT:     Head: Normocephalic and atraumatic.  Eyes:     General: No scleral icterus.       Right eye: No discharge.        Left eye: No discharge.     Conjunctiva/sclera: Conjunctivae normal.  Pulmonary:     Effort: Pulmonary effort is normal.  Abdominal:     Hernia: There is no hernia in the left inguinal area or right inguinal area.  Genitourinary:    Penis: Circumcised. No hypospadias, erythema, tenderness, discharge, swelling or lesions.      Testes:        Right: Mass, tenderness or swelling not present. Right testis is descended.        Left: Mass, tenderness or swelling not present. Left testis is descended.     Epididymis:     Right: Not inflamed or enlarged.     Left: Not inflamed or enlarged.    Lymphadenopathy:     Lower Body: No right inguinal adenopathy. No left inguinal adenopathy.  Neurological:     Mental Status: He is alert and oriented to person, place, and time.  Psychiatric:        Mood and Affect: Mood normal.        Behavior: Behavior normal.     BP (!) 168/102   Pulse 89   Temp 97.6 F (36.4 C) (Tympanic)   Ht 6' (1.829 m)   Wt 295 lb 9.6 oz (  134.1 kg)   SpO2 96%   BMI 40.09 kg/m  Wt Readings from Last 3 Encounters:  02/05/20 295 lb 9.6 oz (134.1 kg)  07/09/19 (!) 310 lb (140.6 kg)  05/29/19 (!) 313 lb 8 oz (142.2 kg)     Health Maintenance Due  Topic Date Due  . Hepatitis C Screening  Never done  . OPHTHALMOLOGY EXAM  Never done  . COVID-19 Vaccine (1) Never done  . HEMOGLOBIN A1C  11/27/2019    There are no preventive care reminders to display for this patient.  Lab Results  Component Value Date   TSH 1.23 11/29/2016   Lab Results  Component Value Date   WBC 9.8 05/29/2019   HGB 13.7 05/29/2019   HCT 41.2 05/29/2019   MCV 88.4 05/29/2019   PLT 201.0 05/29/2019   Lab Results  Component Value Date   NA 140 05/29/2019   K 3.8 05/29/2019   CO2 32 05/29/2019   GLUCOSE 157 (H) 05/29/2019   BUN 14 05/29/2019   CREATININE 1.10 05/29/2019   BILITOT 0.5 05/29/2019   ALKPHOS 43 05/29/2019   AST 39 (H) 05/29/2019   ALT 75 (H) 05/29/2019   PROT 6.9 05/29/2019   ALBUMIN 4.1 05/29/2019   CALCIUM 9.5 05/29/2019   ANIONGAP 14 10/23/2018   GFR 88.59 05/29/2019   Lab Results  Component Value Date   CHOL 180 05/29/2019   Lab Results  Component Value Date   HDL 37.30 (L) 05/29/2019   Lab Results  Component Value Date   LDLCALC 160 (H) 11/29/2016   Lab Results  Component Value Date   TRIG 218.0 (H) 05/29/2019   Lab Results  Component Value Date   CHOLHDL 5 05/29/2019   Lab Results  Component Value Date   HGBA1C 7.9 (H) 05/29/2019      Assessment & Plan:   Problem List Items Addressed This Visit      Cardiovascular and  Mediastinum   Essential hypertension - Primary     Other   History of exposure to blood or body fluid   Relevant Medications   metroNIDAZOLE (FLAGYL) 250 MG tablet   Other Relevant Orders   RPR   HIV Antibody (routine testing w rflx)   Urine cytology ancillary only      Meds ordered this encounter  Medications  . metroNIDAZOLE (FLAGYL) 250 MG tablet    Sig: Take 4 tablets at once with food. No alcohol within 24 hours of taking this medicine.    Dispense:  4 tablet    Refill:  0    Follow-up: Return Follow up with Michael Willis ASAP for blood pressure and diabetes..  Given information on safe sex and preventing hypertension.  Libby Maw, MD

## 2020-02-05 NOTE — Patient Instructions (Addendum)
Preventing Hypertension Hypertension, commonly called high blood pressure, is when the force of blood pumping through the arteries is too strong. Arteries are blood vessels that carry blood from the heart throughout the body. Over time, hypertension can damage the arteries and decrease blood flow to important parts of the body, including the brain, heart, and kidneys. Often, hypertension does not cause symptoms until blood pressure is very high. For this reason, it is important to have your blood pressure checked on a regular basis. Hypertension can often be prevented with diet and lifestyle changes. If you already have hypertension, you can control it with diet and lifestyle changes, as well as medicine. What nutrition changes can be made? Maintain a healthy diet. This includes:  Eating less salt (sodium). Ask your health care provider how much sodium is safe for you to have. The general recommendation is to consume less than 1 tsp (2,300 mg) of sodium a day. ? Do not add salt to your food. ? Choose low-sodium options when grocery shopping and eating out.  Limiting fats in your diet. You can do this by eating low-fat or fat-free dairy products and by eating less red meat.  Eating more fruits, vegetables, and whole grains. Make a goal to eat: ? 1-2 cups of fresh fruits and vegetables each day. ? 3-4 servings of whole grains each day.  Avoiding foods and beverages that have added sugars.  Eating fish that contain healthy fats (omega-3 fatty acids), such as mackerel or salmon. If you need help putting together a healthy eating plan, try the DASH diet. This diet is high in fruits, vegetables, and whole grains. It is low in sodium, red meat, and added sugars. DASH stands for Dietary Approaches to Stop Hypertension. What lifestyle changes can be made?   Lose weight if you are overweight. Losing just 3?5% of your body weight can help prevent or control hypertension. ? For example, if your present  weight is 200 lb (91 kg), a loss of 3-5% of your weight means losing 6-10 lb (2.7-4.5 kg). ? Ask your health care provider to help you with a diet and exercise plan to safely lose weight.  Get enough exercise. Do at least 150 minutes of moderate-intensity exercise each week. ? You could do this in short exercise sessions several times a day, or you could do longer exercise sessions a few times a week. For example, you could take a brisk 10-minute walk or bike ride, 3 times a day, for 5 days a week.  Find ways to reduce stress, such as exercising, meditating, listening to music, or taking a yoga class. If you need help reducing stress, ask your health care provider.  Do not smoke. This includes e-cigarettes. Chemicals in tobacco and nicotine products raise your blood pressure each time you smoke. If you need help quitting, ask your health care provider.  Avoid alcohol. If you drink alcohol, limit alcohol intake to no more than 1 drink a day for nonpregnant women and 2 drinks a day for men. One drink equals 12 oz of beer, 5 oz of wine, or 1 oz of hard liquor. Why are these changes important? Diet and lifestyle changes can help you prevent hypertension, and they may make you feel better overall and improve your quality of life. If you have hypertension, making these changes will help you control it and help prevent major complications, such as:  Hardening and narrowing of arteries that supply blood to: ? Your heart. This can cause a heart  attack. ? Your brain. This can cause a stroke. ? Your kidneys. This can cause kidney failure.  Stress on your heart muscle, which can cause heart failure. What can I do to lower my risk?  Work with your health care provider to make a hypertension prevention plan that works for you. Follow your plan and keep all follow-up visits as told by your health care provider.  Learn how to check your blood pressure at home. Make sure that you know your personal target  blood pressure, as told by your health care provider. How is this treated? In addition to diet and lifestyle changes, your health care provider may recommend medicines to help lower your blood pressure. You may need to try a few different medicines to find what works best for you. You also may need to take more than one medicine. Take over-the-counter and prescription medicines only as told by your health care provider. Where to find support Your health care provider can help you prevent hypertension and help you keep your blood pressure at a healthy level. Your local hospital or your community may also provide support services and prevention programs. The American Heart Association offers an online support network at: CheapBootlegs.com.cy Where to find more information Learn more about hypertension from:  [place], [facility], and Blood Institute: ElectronicHangman.is  Centers for Disease Control and Prevention: https://ingram.com/  American Academy of Family Physicians: http://familydoctor.org/familydoctor/en/diseases-conditions/high-blood-pressure.printerview.all.html Learn more about the DASH diet from:  [place] [place]: https://www.reyes.com/ Contact a health care provider if:  You think you are having a reaction to medicines you have taken.  You have recurrent headaches or feel dizzy.  You have swelling in your ankles.  You have trouble with your vision. Summary  Hypertension often does not cause any symptoms until blood pressure is very high. It is important to get your blood pressure checked regularly.  Diet and lifestyle changes are the most important steps in preventing hypertension.  By keeping your blood pressure in a healthy range, you can prevent complications like heart attack, heart failure, stroke, and kidney failure.  Work with your health care  provider to make a hypertension prevention plan that works for you. This information is not intended to replace advice given to you by your health care provider. Make sure you discuss any questions you have with your health care provider. Document Revised: 11/28/2018 Document Reviewed: 04/16/2016 Elsevier Patient Education  [place] Sex Practicing safe sex means taking steps before and during sex to reduce your risk of:  Getting an STI (sexually transmitted infection).  Giving your partner an STI.  Unwanted or unplanned pregnancy. How can I practice safe sex?     Ways you can practice safe sex  Limit your sexual partners to only one partner who is having sex with only you.  Avoid using alcohol and drugs before having sex. Alcohol and drugs can affect your judgment.  Before having sex with a new partner: ? Talk to your partner about past partners, past STIs, and drug use. ? Get screened for STIs and discuss the results with your partner. Ask your partner to get screened, too.  Check your body regularly for sores, blisters, rashes, or unusual discharge. If you notice any of these problems, visit your health care provider.  Avoid sexual contact if you have symptoms of an infection or you are being treated for an STI.  While having sex, use a condom. Make sure to: ? Use a condom every  time you have vaginal, oral, or anal sex. Both females and males should wear condoms during oral sex. ? Keep condoms in place from the beginning to the end of sexual activity. ? Use a latex condom, if possible. Latex condoms offer the best protection. ? Use only water-based lubricants with a condom. Using petroleum-based lubricants or oils will weaken the condom and increase the chance that it will break. Ways your health care provider can help you practice safe sex  See your health care provider for regular screenings, exams, and tests for STIs.  Talk with your health care provider  about what kind of birth control (contraception) is best for you.  Get vaccinated against hepatitis B and human papillomavirus (HPV).  If you are at risk of being infected with HIV (human immunodeficiency virus), talk with your health care provider about taking a prescription medicine to prevent HIV infection. You are at risk for HIV if you: ? Are a man who has sex with other men. ? Are sexually active with more than one partner. ? Take drugs by injection. ? Have a sex partner who has HIV. ? Have unprotected sex. ? Have sex with someone who has sex with both men and women. ? Have had an STI. Follow these instructions at home:  Take over-the-counter and prescription medicines as told by your health care provider.  Keep all follow-up visits as told by your health care provider. This is important. Where to find more information  Centers for Disease Control and Prevention: LessFurniture.be  Planned Parenthood: https://www.plannedparenthood.org/  Office on Women's Health: EmploymentTracking.tn Summary  Practicing safe sex means taking steps before and during sex to reduce your risk of STIs, giving your partner STIs, and having an unwanted or unplanned pregnancy.  Before having sex with a new partner, talk to your partner about past partners, past STIs, and drug use.  Use a condom every time you have vaginal, oral, or anal sex. Both females and males should wear condoms during oral sex.  Check your body regularly for sores, blisters, rashes, or unusual discharge. If you notice any of these problems, visit your health care provider.  See your health care provider for regular screenings, exams, and tests for STIs. This information is not intended to replace advice given to you by your health care provider. Make sure you discuss any questions you have with your health care provider. Document Revised: 11/28/2018  Document Reviewed: 05/19/2018 Elsevier Patient Education  2020 [employer].

## 2020-02-08 LAB — URINE CYTOLOGY ANCILLARY ONLY
Chlamydia: NEGATIVE
Comment: NEGATIVE
Comment: NEGATIVE
Comment: NORMAL
Neisseria Gonorrhea: NEGATIVE
Trichomonas: NEGATIVE

## 2020-02-08 LAB — HIV ANTIBODY (ROUTINE TESTING W REFLEX): HIV 1&2 Ab, 4th Generation: NONREACTIVE

## 2020-02-08 LAB — RPR: RPR Ser Ql: NONREACTIVE

## 2020-03-01 ENCOUNTER — Encounter: Payer: Self-pay | Admitting: Internal Medicine

## 2020-10-27 ENCOUNTER — Encounter: Payer: Self-pay | Admitting: Internal Medicine

## 2021-10-19 ENCOUNTER — Encounter: Payer: Self-pay | Admitting: Internal Medicine

## 2022-11-29 ENCOUNTER — Encounter: Payer: Self-pay | Admitting: Internal Medicine

## 2023-01-28 ENCOUNTER — Encounter (HOSPITAL_COMMUNITY): Payer: Self-pay | Admitting: *Deleted

## 2023-01-28 ENCOUNTER — Other Ambulatory Visit: Payer: Self-pay

## 2023-01-28 ENCOUNTER — Emergency Department (HOSPITAL_COMMUNITY): Payer: Medicaid Other

## 2023-01-28 ENCOUNTER — Inpatient Hospital Stay (HOSPITAL_COMMUNITY)
Admission: EM | Admit: 2023-01-28 | Discharge: 2023-02-01 | DRG: 286 | Disposition: A | Discharge status: Home / Self Care | Payer: Medicaid Other | Attending: Family Medicine | Admitting: Family Medicine

## 2023-01-28 DIAGNOSIS — I5021 Acute systolic (congestive) heart failure: Secondary | ICD-10-CM | POA: Diagnosis present

## 2023-01-28 DIAGNOSIS — I509 Heart failure, unspecified: Principal | ICD-10-CM

## 2023-01-28 DIAGNOSIS — Z6837 Body mass index (BMI) 37.0-37.9, adult: Secondary | ICD-10-CM

## 2023-01-28 DIAGNOSIS — J9811 Atelectasis: Secondary | ICD-10-CM | POA: Diagnosis present

## 2023-01-28 DIAGNOSIS — E118 Type 2 diabetes mellitus with unspecified complications: Secondary | ICD-10-CM

## 2023-01-28 DIAGNOSIS — I43 Cardiomyopathy in diseases classified elsewhere: Secondary | ICD-10-CM | POA: Diagnosis present

## 2023-01-28 DIAGNOSIS — E669 Obesity, unspecified: Secondary | ICD-10-CM | POA: Diagnosis present

## 2023-01-28 DIAGNOSIS — Z7982 Long term (current) use of aspirin: Secondary | ICD-10-CM

## 2023-01-28 DIAGNOSIS — N179 Acute kidney failure, unspecified: Secondary | ICD-10-CM | POA: Diagnosis not present

## 2023-01-28 DIAGNOSIS — E785 Hyperlipidemia, unspecified: Secondary | ICD-10-CM | POA: Diagnosis present

## 2023-01-28 DIAGNOSIS — I110 Hypertensive heart disease with heart failure: Principal | ICD-10-CM | POA: Diagnosis present

## 2023-01-28 DIAGNOSIS — M7989 Other specified soft tissue disorders: Secondary | ICD-10-CM

## 2023-01-28 DIAGNOSIS — I160 Hypertensive urgency: Secondary | ICD-10-CM | POA: Diagnosis present

## 2023-01-28 DIAGNOSIS — Z8249 Family history of ischemic heart disease and other diseases of the circulatory system: Secondary | ICD-10-CM

## 2023-01-28 DIAGNOSIS — K761 Chronic passive congestion of liver: Secondary | ICD-10-CM | POA: Diagnosis present

## 2023-01-28 DIAGNOSIS — Z79899 Other long term (current) drug therapy: Secondary | ICD-10-CM

## 2023-01-28 DIAGNOSIS — I428 Other cardiomyopathies: Secondary | ICD-10-CM | POA: Diagnosis present

## 2023-01-28 DIAGNOSIS — Z87891 Personal history of nicotine dependence: Secondary | ICD-10-CM

## 2023-01-28 DIAGNOSIS — Z597 Insufficient social insurance and welfare support: Secondary | ICD-10-CM

## 2023-01-28 DIAGNOSIS — E119 Type 2 diabetes mellitus without complications: Secondary | ICD-10-CM | POA: Diagnosis present

## 2023-01-28 LAB — COMPREHENSIVE METABOLIC PANEL WITH GFR
ALT: 58 U/L — ABNORMAL HIGH (ref 0–44)
AST: 32 U/L (ref 15–41)
Albumin: 3.4 g/dL — ABNORMAL LOW (ref 3.5–5.0)
Alkaline Phosphatase: 39 U/L (ref 38–126)
Anion gap: 10 (ref 5–15)
BUN: 19 mg/dL (ref 6–20)
CO2: 25 mmol/L (ref 22–32)
Calcium: 9.2 mg/dL (ref 8.9–10.3)
Chloride: 102 mmol/L (ref 98–111)
Creatinine, Ser: 1.52 mg/dL — ABNORMAL HIGH (ref 0.61–1.24)
GFR, Estimated: 57 mL/min — ABNORMAL LOW
Glucose, Bld: 167 mg/dL — ABNORMAL HIGH (ref 70–99)
Potassium: 4.3 mmol/L (ref 3.5–5.1)
Sodium: 137 mmol/L (ref 135–145)
Total Bilirubin: 1.5 mg/dL — ABNORMAL HIGH (ref 0.3–1.2)
Total Protein: 6.3 g/dL — ABNORMAL LOW (ref 6.5–8.1)

## 2023-01-28 LAB — LACTIC ACID, PLASMA: Lactic Acid, Venous: 1.6 mmol/L (ref 0.5–1.9)

## 2023-01-28 LAB — URINALYSIS, ROUTINE W REFLEX MICROSCOPIC
Bacteria, UA: NONE SEEN
Bilirubin Urine: NEGATIVE
Glucose, UA: NEGATIVE mg/dL
Ketones, ur: NEGATIVE mg/dL
Leukocytes,Ua: NEGATIVE
Nitrite: NEGATIVE
Protein, ur: 100 mg/dL — AB
Specific Gravity, Urine: 1.013 (ref 1.005–1.030)
pH: 5 (ref 5.0–8.0)

## 2023-01-28 LAB — CBC WITH DIFFERENTIAL/PLATELET
Abs Immature Granulocytes: 0.04 10*3/uL (ref 0.00–0.07)
Basophils Absolute: 0.1 10*3/uL (ref 0.0–0.1)
Basophils Relative: 1 %
Eosinophils Absolute: 0.1 10*3/uL (ref 0.0–0.5)
Eosinophils Relative: 1 %
HCT: 43.9 % (ref 39.0–52.0)
Hemoglobin: 14.1 g/dL (ref 13.0–17.0)
Immature Granulocytes: 0 %
Lymphocytes Relative: 26 %
Lymphs Abs: 2.9 10*3/uL (ref 0.7–4.0)
MCH: 28.7 pg (ref 26.0–34.0)
MCHC: 32.1 g/dL (ref 30.0–36.0)
MCV: 89.4 fL (ref 80.0–100.0)
Monocytes Absolute: 0.9 10*3/uL (ref 0.1–1.0)
Monocytes Relative: 8 %
Neutro Abs: 7.2 10*3/uL (ref 1.7–7.7)
Neutrophils Relative %: 64 %
Platelets: 196 10*3/uL (ref 150–400)
RBC: 4.91 MIL/uL (ref 4.22–5.81)
RDW: 12.4 % (ref 11.5–15.5)
WBC: 11.2 10*3/uL — ABNORMAL HIGH (ref 4.0–10.5)
nRBC: 0 % (ref 0.0–0.2)

## 2023-01-28 LAB — TROPONIN I (HIGH SENSITIVITY): Troponin I (High Sensitivity): 35 ng/L — ABNORMAL HIGH (ref <18)

## 2023-01-28 LAB — BRAIN NATRIURETIC PEPTIDE: B Natriuretic Peptide: 1566.1 pg/mL — ABNORMAL HIGH (ref 0.0–100.0)

## 2023-01-28 MED ORDER — ONDANSETRON HCL 4 MG PO TABS: 4.0000 mg | ORAL_TABLET | Freq: Four times a day (QID) | ORAL | Status: DC | PRN

## 2023-01-28 MED ORDER — ONDANSETRON HCL 4 MG/2ML IJ SOLN: 4.0000 mg | Freq: Four times a day (QID) | INTRAMUSCULAR | Status: DC | PRN

## 2023-01-28 MED ORDER — ENOXAPARIN SODIUM 40 MG/0.4ML IJ SOSY
40.0000 mg | PREFILLED_SYRINGE | INTRAMUSCULAR | Status: DC
  Administered 2023-01-28 – 2023-01-29 (×2): 40 mg via SUBCUTANEOUS
  Filled 2023-01-28 (×2): qty 0.4

## 2023-01-28 MED ORDER — FUROSEMIDE 20 MG PO TABS
20.0000 mg | ORAL_TABLET | Freq: Once | ORAL | Status: AC
  Administered 2023-01-28: 20 mg via ORAL
  Filled 2023-01-28: qty 1

## 2023-01-28 MED ORDER — PREDNISONE 20 MG PO TABS
40.0000 mg | ORAL_TABLET | Freq: Once | ORAL | Status: AC
  Administered 2023-01-28: 40 mg via ORAL
  Filled 2023-01-28: qty 2

## 2023-01-28 MED ORDER — ACETAMINOPHEN 325 MG PO TABS
650.0000 mg | ORAL_TABLET | Freq: Four times a day (QID) | ORAL | Status: DC | PRN
  Filled 2023-01-28: qty 2

## 2023-01-28 MED ORDER — HYDROCHLOROTHIAZIDE 12.5 MG PO TABS
12.5000 mg | ORAL_TABLET | Freq: Once | ORAL | Status: DC
  Filled 2023-01-28: qty 1

## 2023-01-28 MED ORDER — AEROCHAMBER PLUS FLO-VU LARGE MISC
Status: AC
  Administered 2023-01-28: 1
  Filled 2023-01-28: qty 1

## 2023-01-28 MED ORDER — FUROSEMIDE 10 MG/ML IJ SOLN
60.0000 mg | Freq: Two times a day (BID) | INTRAMUSCULAR | Status: DC
  Administered 2023-01-28 – 2023-01-29 (×2): 60 mg via INTRAVENOUS
  Filled 2023-01-28 (×2): qty 6

## 2023-01-28 MED ORDER — HYDRALAZINE HCL 20 MG/ML IJ SOLN
10.0000 mg | INTRAMUSCULAR | Status: DC | PRN
  Administered 2023-01-28: 10 mg via INTRAVENOUS
  Filled 2023-01-28 (×2): qty 1

## 2023-01-28 MED ORDER — AEROCHAMBER PLUS FLO-VU LARGE MISC: 1.0000 | Freq: Once | Status: AC

## 2023-01-28 MED ORDER — LOSARTAN POTASSIUM 50 MG PO TABS
50.0000 mg | ORAL_TABLET | ORAL | Status: AC
  Administered 2023-01-28: 50 mg via ORAL
  Filled 2023-01-28: qty 1

## 2023-01-28 MED ORDER — HYDRALAZINE HCL 20 MG/ML IJ SOLN
10.0000 mg | Freq: Once | INTRAMUSCULAR | Status: AC
  Administered 2023-01-28: 10 mg via INTRAVENOUS
  Filled 2023-01-28: qty 1

## 2023-01-28 MED ORDER — SACUBITRIL-VALSARTAN 24-26 MG PO TABS
1.0000 | ORAL_TABLET | Freq: Two times a day (BID) | ORAL | Status: DC
  Administered 2023-01-29 – 2023-01-30 (×3): 1 via ORAL
  Filled 2023-01-28 (×3): qty 1

## 2023-01-28 MED ORDER — ALBUTEROL SULFATE HFA 108 (90 BASE) MCG/ACT IN AERS
2.0000 | INHALATION_SPRAY | Freq: Once | RESPIRATORY_TRACT | Status: AC
  Administered 2023-01-28: 2 via RESPIRATORY_TRACT
  Filled 2023-01-28: qty 6.7

## 2023-01-28 MED ORDER — ACETAMINOPHEN 650 MG RE SUPP: 650.0000 mg | Freq: Four times a day (QID) | RECTAL | Status: DC | PRN

## 2023-01-28 NOTE — ED Notes (Signed)
Dr.Paterson made aware of pt bp of 194/139. Pt is asymptomatic. Alert and oriented x 4. Cardiac monitoring in place. Stopped taking his bp meds in a few months. Will continue to monitor.

## 2023-01-28 NOTE — H&P (Signed)
History and Physical    Patient: Michael Willis:[id number] DOB: 1976-07-30 DOA: 01/28/2023 DOS: the patient was seen and examined on 01/28/2023 PCP: Wanda Plump, MD  Patient coming from: Home  Chief Complaint:  Chief Complaint  Patient presents with   Leg Swelling   Headache   HPI: Michael Willis is a 47 y.o. male with medical history significant of diabetes, history of persistent dyspnea on exertion.  Patient with associated orthopnea without PND.  No associated chest pain.  He also reports development of bilateral lower extremity swelling for the last 3 days.  He reports seeking medical attention because he plans on going on a cruise next month and wanted to be medically stable prior to going to another country.  Patient reports no cough, significant sneezing, or rhinorrhea, nausea, vomiting, diarrhea, constipation.  Review of Systems: As mentioned in the history of present illness. All other systems reviewed and are negative. Past Medical History:  Diagnosis Date   Diabetes (HCC) 01/05/2013   Elevated LFTs 01/05/2013   HTN (hypertension) 12/12/2012   Hyperlipidemia 12/04/2013   Obesity    Tinea pedis, onychomycosis 01/05/2013   Past Surgical History:  Procedure Laterality Date   NO PAST SURGERIES     Social History:  reports that he quit smoking about 22 years ago. His smoking use included cigars. He has never used smokeless tobacco. He reports current alcohol use. He reports that he does not use drugs.  No Known Allergies  Family History  Problem Relation Age of Onset   CAD Mother 19       MI?   Diabetes Neg Hx    Stroke Neg Hx    Colon cancer Neg Hx    Prostate cancer Neg Hx     Prior to Admission medications   Medication Sig Start Date End Date Taking? Authorizing Provider  amLODipine (NORVASC) 10 MG tablet Take 1 tablet (10 mg total) by mouth daily. Patient not taking: Reported on 02/05/2020 11/12/19   Wanda Plump, MD  aspirin EC 81 MG EC tablet Take 1 tablet (81 mg  total) by mouth daily. Patient not taking: Reported on 02/05/2020 06/17/16   Lonia Blood, MD  atorvastatin (LIPITOR) 20 MG tablet Take 1 tablet (20 mg total) by mouth at bedtime. Patient not taking: Reported on 02/05/2020 05/08/19   Wanda Plump, MD  blood glucose meter kit and supplies Dispense based on patient and insurance preference. Check blood sugar three times daily. DX: E11.9 05/29/19   Wanda Plump, MD  glucose blood test strip Check blood sugar three times daily 11/29/16   Wanda Plump, MD  insulin NPH Human (NOVOLIN N) 100 UNIT/ML injection 36 units before breakfast, 21 units before dinner Patient not taking: Reported on 02/05/2020 07/09/19   Wanda Plump, MD  INSULIN SYRINGE .5CC/28G 28G X 1/2" 0.5 ML MISC 1 Syringe by Does not apply route 2 (two) times daily. Patient not taking: Reported on 02/05/2020 06/16/16   Lonia Blood, MD  Lancets [facility] ULTRASOFT) lancets Check blood sugar three times daily 06/20/16   Wanda Plump, MD  losartan-hydrochlorothiazide [facility]) 100-12.5 MG tablet Take 1 tablet by mouth daily. Patient not taking: Reported on 02/05/2020 10/05/19   Wanda Plump, MD  metroNIDAZOLE (FLAGYL) 250 MG tablet Take 4 tablets at once with food. No alcohol within 24 hours of taking this medicine. 02/05/20   Mliss Sax, MD  sitaGLIPtin-metformin (JANUMET) 50-1000 MG tablet Take 1 tablet by mouth  2 (two) times daily with a meal. Patient not taking: Reported on 02/05/2020 05/08/19   Wanda Plump, MD    Physical Exam: Vitals:   01/28/23 1400 01/28/23 1500 01/28/23 1550 01/28/23 1600  BP: (!) 170/123 (!) 187/139  (!) 194/139  Pulse: 95 (!) 108  (!) 111  Resp: 19 20  20   Temp:   98 F (36.7 C)   TempSrc:   Oral   SpO2: 96% 97%  96%  Weight:      Height:       General exam: Appears calm and comfortable and in no acute distress. Conversant Respiratory: Clear to auscultation. Respiratory effort normal with no intercostal retractions or use of accessory  muscles Cardiovascular: S1 & S2 heard, RRR.  1+ pitting lower extremity edema Gastrointestinal: Abdomen is non-distended, soft and non-tender. No masses felt.  Normal bowel sounds heard Neurologic: No focal neurological deficits Musculoskeletal: No calf tenderness Skin: No cyanosis. No new rashes Psychiatry: Alert and oriented x 4. Memory intact. Mood & affect appropriate   Data Reviewed: There are no new results to review at this time.  Assessment and Plan:  Acute systolic heart failure Presentation consistent with heart failure.  Advanced heart team performed bedside echo with evidence of reduced LVEF of 20%.  Likely hypertensive cardiomyopathy with as etiology. -Cardiology recommendations: Entresto, Lasix twice daily, formal echocardiogram, TSH, lipid panel, outpatient sleep study  Severe asymptomatic hypertension Present on admission.  Currently no symptoms.  Patient received losartan 50 mg x 1 and is now started on Entresto per cardiology in addition to Lasix diuresis -Cardiology recommendations: Entresto, Lasix IV twice daily, hydralazine IV as needed  Diabetes mellitus type 2 Unclear if uncontrolled.  Patient currently take medication although previously prescribed.  Glucose moderately elevated on nonfasting basic metabolic panel. -Obtain hemoglobin A1c  AKI Most recent baseline creatinine of 1.1 from 4 years prior.  Possible AKI versus possible CKD.  Patient with a creatinine of 1.52 on admission and normal BUN.  Urinalysis significant for mild protein and small hemoglobin.  If an AKI, likely related to fluid overload from heart failure.  Patient currently started on diuresis per cardiology. -BMP in a.m.  Abnormal x-ray Concern for possible pneumonia versus atelectasis. Patient's history not consistent with pneumonia. -Incentive spirometer  Obesity Estimated body mass index is 36.78 kg/m as calculated from the following:   Height as of this encounter: 5' 10.5" (1.791  m).   Weight as of this encounter: 117.9 kg.    Advance Care Planning:   Code Status: Full Code  Consults: Cardiology/advanced heart failure  Family Communication: None at bedside   Author: Jacquelin Hawking, MD 01/28/2023 4:09 PM  For on call review [email or web address].

## 2023-01-28 NOTE — ED Notes (Signed)
ED TO INPATIENT HANDOFF REPORT  ED Nurse Name and Phone #: Waunita Schooner Name/Age/Gender Liston Alba 47 y.o. male Room/Bed: TRAAC/TRAAC  Code Status   Code Status: Full Code  Home/SNF/Other Home Patient oriented to: self, place, time, and situation Is this baseline? Yes   Triage Complete: Triage complete  Chief Complaint Acute heart failure (HCC) [I50.9]  Triage Note C/o pain in the back of his head off and on c/o sob and lower ext swelling onset 2-3 weeks ago, no change today.    Allergies No Known Allergies  Level of Care/Admitting Diagnosis ED Disposition     ED Disposition  Admit   Condition  --   Comment  Hospital Area: [facility] [[id number]]  Level of Care: Progressive [102]  Admit to Progressive based on following criteria: CARDIOVASCULAR & THORACIC of moderate stability with acute coronary syndrome symptoms/low risk myocardial infarction/hypertensive urgency/arrhythmias/heart failure potentially compromising stability and stable post cardiovascular intervention patients.  May place patient in [facility] at [facility] or [facility] if equivalent level of care is available:: No  Covid Evaluation: Asymptomatic - no recent exposure (last 10 days) testing not required  Diagnosis: Acute heart failure [facility]) [[id number]]  Admitting Physician: Narda Bonds [phone]  Attending Physician: Jacquelin Hawking A [phone]  Bed request comments: Please do not admit to 5W if possible. Thanks.          B Medical/Surgery History Past Medical History:  Diagnosis Date   Diabetes (HCC) 01/05/2013   Elevated LFTs 01/05/2013   HTN (hypertension) 12/12/2012   Hyperlipidemia 12/04/2013   Obesity    Tinea pedis, onychomycosis 01/05/2013   Past Surgical History:  Procedure Laterality Date   NO PAST SURGERIES       A IV Location/Drains/Wounds Patient Lines/Drains/Airways Status     Active Line/Drains/Airways     Name Placement date Placement time Site Days    Peripheral IV 01/28/23 20 G Anterior;Right Hand 01/28/23  1733  Hand  less than 1   Wound 07/09/11 Leg Right redness with swelling and pimple in center oozing bloody drainage post i/d 07/09/11  --  Leg  4221   Wound 12/21/12 Laceration Toe (Comment  which one) Right fifth; about 1 cm in length 12/21/12  2055  Toe (Comment  which one)  3690            Intake/Output Last 24 hours  Intake/Output Summary (Last 24 hours) at 01/28/2023 1904 Last data filed at 01/28/2023 1904 Gross per 24 hour  Intake --  Output 750 ml  Net -750 ml    Labs/Imaging Results for orders placed or performed during the hospital encounter of 01/28/23 (from the past 48 hour(s))  Comprehensive metabolic panel     Status: Abnormal   Collection Time: 01/28/23  9:52 AM  Result Value Ref Range   Sodium 137 135 - 145 mmol/L   Potassium 4.3 3.5 - 5.1 mmol/L   Chloride 102 98 - 111 mmol/L   CO2 25 22 - 32 mmol/L   Glucose, Bld 167 (H) 70 - 99 mg/dL    Comment: Glucose reference range applies only to samples taken after fasting for at least 8 hours.   BUN 19 6 - 20 mg/dL   Creatinine, Ser 6.21 (H) 0.61 - 1.24 mg/dL   Calcium 9.2 8.9 - 30.8 mg/dL   Total Protein 6.3 (L) 6.5 - 8.1 g/dL   Albumin 3.4 (L) 3.5 - 5.0 g/dL   AST 32 15 - 41 U/L  ALT 58 (H) 0 - 44 U/L   Alkaline Phosphatase 39 38 - 126 U/L   Total Bilirubin 1.5 (H) 0.3 - 1.2 mg/dL   GFR, Estimated 57 (L) >60 mL/min    Comment: (NOTE) Calculated using the CKD-EPI Creatinine Equation (2021)    Anion gap 10 5 - 15    Comment: Performed at [facility], [address], [place], [place] [address]  CBC with Differential     Status: Abnormal   Collection Time: 01/28/23  9:52 AM  Result Value Ref Range   WBC 11.2 (H) 4.0 - 10.5 K/uL   RBC 4.91 4.22 - 5.81 MIL/uL   Hemoglobin 14.1 13.0 - 17.0 g/dL   HCT 60.4 54.0 - 98.1 %   MCV 89.4 80.0 - 100.0 fL   MCH 28.7 26.0 - 34.0 pg   MCHC 32.1 30.0 - 36.0 g/dL   RDW 19.1 47.8 - 29.5 %   Platelets 196 150  - 400 K/uL   nRBC 0.0 0.0 - 0.2 %   Neutrophils Relative % 64 %   Neutro Abs 7.2 1.7 - 7.7 K/uL   Lymphocytes Relative 26 %   Lymphs Abs 2.9 0.7 - 4.0 K/uL   Monocytes Relative 8 %   Monocytes Absolute 0.9 0.1 - 1.0 K/uL   Eosinophils Relative 1 %   Eosinophils Absolute 0.1 0.0 - 0.5 K/uL   Basophils Relative 1 %   Basophils Absolute 0.1 0.0 - 0.1 K/uL   Immature Granulocytes 0 %   Abs Immature Granulocytes 0.04 0.00 - 0.07 K/uL    Comment: Performed at [facility], [address] [place], [place] [address]  Brain natriuretic peptide     Status: Abnormal   Collection Time: 01/28/23  9:52 AM  Result Value Ref Range   B Natriuretic Peptide 1,566.1 (H) 0.0 - 100.0 pg/mL    Comment: Performed at [facility], [address], [place], [place] [address]  Lactic acid, plasma     Status: None   Collection Time: 01/28/23  9:54 AM  Result Value Ref Range   Lactic Acid, Venous 1.6 0.5 - 1.9 mmol/L    Comment: Performed at [facility], [address] [place], [place] [address]  Urinalysis, Routine w reflex microscopic -Urine, Clean Catch     Status: Abnormal   Collection Time: 01/28/23 11:54 AM  Result Value Ref Range   Color, Urine YELLOW YELLOW   APPearance CLEAR CLEAR   Specific Gravity, Urine 1.013 1.005 - 1.030   pH 5.0 5.0 - 8.0   Glucose, UA NEGATIVE NEGATIVE mg/dL   Hgb urine dipstick SMALL (A) NEGATIVE   Bilirubin Urine NEGATIVE NEGATIVE   Ketones, ur NEGATIVE NEGATIVE mg/dL   Protein, ur 952 (A) NEGATIVE mg/dL   Nitrite NEGATIVE NEGATIVE   Leukocytes,Ua NEGATIVE NEGATIVE   RBC / HPF 0-5 0 - 5 RBC/hpf   WBC, UA 0-5 0 - 5 WBC/hpf   Bacteria, UA NONE SEEN NONE SEEN   Squamous Epithelial / HPF 0-5 0 - 5 /HPF   Mucus PRESENT     Comment: Performed at [facility], [address] [place], [place] [address]   DG Chest 2 View  Result Date: 01/28/2023 CLINICAL DATA:  Back pain, shortness of breath EXAM: CHEST - 2 VIEW COMPARISON:  10/23/2018 FINDINGS:  Hazy right basilar airspace disease which may reflect atelectasis versus pneumonia. No pleural effusion or pneumothorax. Mild cardiomegaly. No acute osseous abnormality. IMPRESSION: 1. Hazy  right basilar airspace disease which may reflect atelectasis versus pneumonia. Electronically Signed   By: Elige Ko M.D.   On: 01/28/2023 10:59    Pending Labs Unresulted Labs (From admission, onward)     Start     Ordered   01/29/23 0500  Basic metabolic panel  Daily,   R      01/28/23 1559   01/29/23 0500  CBC  Daily,   R      01/28/23 1559   01/29/23 0500  Hemoglobin A1c  Tomorrow morning,   R        01/28/23 1559   01/29/23 0500  Lipid panel  Tomorrow morning,   R        01/28/23 1559   01/29/23 0500  TSH  Tomorrow morning,   R        01/28/23 1559   01/29/23 0500  HIV Antibody (routine testing w rflx)  (HIV Antibody (Routine testing w reflex) panel)  Tomorrow morning,   R        01/28/23 1600            Vitals/Pain Today's Vitals   01/28/23 1600 01/28/23 1700 01/28/23 1734 01/28/23 1800  BP: (!) 194/139 (!) 172/115  (!) 171/103  Pulse: (!) 111 (!) 107  (!) 117  Resp: 20 (!) 26  (!) 32  Temp:   98.1 F (36.7 C)   TempSrc:   Oral   SpO2: 96% 95%  98%  Weight:      Height:      PainSc:        Isolation Precautions No active isolations  Medications Medications  furosemide (LASIX) injection 60 mg (60 mg Intravenous Given 01/28/23 1733)  sacubitril-valsartan (ENTRESTO) 24-26 mg per tablet (has no administration in time range)  hydrALAZINE (APRESOLINE) injection 10 mg (has no administration in time range)  predniSONE (DELTASONE) tablet 40 mg (40 mg Oral Given 01/28/23 1210)  albuterol (VENTOLIN HFA) 108 (90 Base) MCG/ACT inhaler 2 puff (2 puffs Inhalation Given 01/28/23 1213)  AeroChamber Plus Flo-Vu Large MISC 1 each (1 each Other Given 01/28/23 1213)  furosemide (LASIX) tablet 20 mg (20 mg Oral Given 01/28/23 1501)  losartan (COZAAR) tablet 50 mg (50 mg Oral Given 01/28/23 1501)   hydrALAZINE (APRESOLINE) injection 10 mg (10 mg Intravenous Given 01/28/23 1734)    Mobility walks     Focused Assessments Cardiac Assessment Handoff:    Lab Results  Component Value Date   TROPONINI <0.03 06/16/2016   No results found for: "DDIMER" Does the Patient currently have chest pain? No    R Recommendations: See Admitting Provider Note  Report given to:   Additional Notes:

## 2023-01-28 NOTE — ED Provider Notes (Signed)
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  47 year old male with history of hypertension, hyperlipidemia, and diabetes not currently on any medications who presented to the emergency department shortness of breath and cough for 2 to 3 weeks.  Says it is worse in the morning when he wakes up.  Has also noticed 3 days of bilateral lower extremity swelling that he says is new.  Wife notices that he has been having some wheezing.  No fevers or chills.  No known sick contacts.  No sore throat or runny nose.  Denies any chest pain.  No personal history of MI, PE, DVT, heart failure or asthma.  No history of heavy tobacco use.  No personal history of steroid use.  No recent surgeries.       Home Medications Prior to Admission medications   Medication Sig Start Date End Date Taking? Authorizing Provider  amLODipine (NORVASC) 10 MG tablet Take 1 tablet (10 mg total) by mouth daily. Patient not taking: Reported on 02/05/2020 11/12/19   Wanda Plump, MD  aspirin EC 81 MG EC tablet Take 1 tablet (81 mg total) by mouth daily. Patient not taking: Reported on 02/05/2020 06/17/16   Lonia Blood, MD  atorvastatin (LIPITOR) 20 MG tablet Take 1 tablet (20 mg total) by mouth at bedtime. Patient not taking: Reported on 02/05/2020 05/08/19   Wanda Plump, MD  blood glucose meter kit and supplies Dispense based on patient and insurance preference. Check blood sugar three times daily. DX: E11.9 05/29/19   Wanda Plump, MD  glucose blood test strip Check blood sugar three times daily 11/29/16   Wanda Plump, MD  insulin NPH Human (NOVOLIN N) 100 UNIT/ML injection 36 units before breakfast, 21 units before dinner Patient not taking: Reported on  02/05/2020 07/09/19   Wanda Plump, MD  INSULIN SYRINGE .5CC/28G 28G X 1/2" 0.5 ML MISC 1 Syringe by Does not apply route 2 (two) times daily. Patient not taking: Reported on 02/05/2020 06/16/16   Lonia Blood, MD  Lancets [facility] ULTRASOFT) lancets Check blood sugar three times daily 06/20/16   Wanda Plump, MD  losartan-hydrochlorothiazide [facility]) 100-12.5 MG tablet Take 1 tablet by mouth daily. Patient not taking: Reported on 02/05/2020 10/05/19   Wanda Plump, MD  metroNIDAZOLE (FLAGYL) 250 MG tablet Take 4 tablets at once with food. No alcohol within 24 hours of taking this medicine. 02/05/20   Mliss Sax, MD  sitaGLIPtin-metformin (JANUMET) 50-1000 MG tablet Take 1 tablet by mouth 2 (two) times daily with a meal. Patient not taking: Reported on 02/05/2020 05/08/19   Wanda Plump, MD      Allergies    Patient has no known allergies.    Review of Systems   Review of Systems  Physical Exam Updated Vital Signs Ht 5' 10.5" (1.791 m)   Wt 117.9 kg   BMI 36.78 kg/m  Physical Exam Vitals and nursing note reviewed.  Constitutional:      General: He is not in acute distress.    Appearance: He is well-developed.  HENT:     Head: Normocephalic and atraumatic.     Right Ear: External ear normal.  Left Ear: External ear normal.     Nose: Nose normal.  Eyes:     Extraocular Movements: Extraocular movements intact.     Conjunctiva/sclera: Conjunctivae normal.     Pupils: Pupils are equal, round, and reactive to light.  Cardiovascular:     Rate and Rhythm: Normal rate and regular rhythm.     Heart sounds: Normal heart sounds.  Pulmonary:     Effort: Pulmonary effort is normal. No respiratory distress.     Breath sounds: Wheezing (Diffuse expiratory) present.  Abdominal:     Palpations: Abdomen is soft.  Musculoskeletal:     Cervical back: Normal range of motion and neck supple.     Right lower leg: Edema (1+) present.     Left lower leg: Edema (1+) present.  Skin:     General: Skin is warm and dry.  Neurological:     Mental Status: He is alert. Mental status is at baseline.  Psychiatric:        Mood and Affect: Mood normal.        Behavior: Behavior normal.     ED Results / Procedures / Treatments   Labs (all labs ordered are listed, but only abnormal results are displayed) Labs Reviewed  COMPREHENSIVE METABOLIC PANEL - Abnormal; Notable for the following components:      Result Value   Glucose, Bld 167 (*)    Creatinine, Ser 1.52 (*)    Total Protein 6.3 (*)    Albumin 3.4 (*)    ALT 58 (*)    Total Bilirubin 1.5 (*)    GFR, Estimated 57 (*)    All other components within normal limits  CBC WITH DIFFERENTIAL/PLATELET - Abnormal; Notable for the following components:   WBC 11.2 (*)    All other components within normal limits  LACTIC ACID, PLASMA  LACTIC ACID, PLASMA  URINALYSIS, ROUTINE W REFLEX MICROSCOPIC  BRAIN NATRIURETIC PEPTIDE    EKG EKG Interpretation  Date/Time:  Monday January 28 2023 09:44:30 EDT Ventricular Rate:  99 PR Interval:  160 QRS Duration: 86 QT Interval:  386 QTC Calculation: 495 R Axis:   29 Text Interpretation: Normal sinus rhythm Minimal voltage criteria for LVH, may be normal variant ( Sokolow-Lyon ) Nonspecific T wave abnormality Abnormal ECG When compared with ECG of 23-Oct-2018 13:01, PREVIOUS ECG IS PRESENT Confirmed by Vonita Moss [phone]) on 01/28/2023 11:54:46 AM  Radiology DG Chest 2 View  Result Date: 01/28/2023 CLINICAL DATA:  Back pain, shortness of breath EXAM: CHEST - 2 VIEW COMPARISON:  10/23/2018 FINDINGS: Hazy right basilar airspace disease which may reflect atelectasis versus pneumonia. No pleural effusion or pneumothorax. Mild cardiomegaly. No acute osseous abnormality. IMPRESSION: 1. Hazy right basilar airspace disease which may reflect atelectasis versus pneumonia. Electronically Signed   By: Elige Ko M.D.   On: 01/28/2023 10:59    Procedures Procedures  {Document cardiac  monitor, telemetry assessment procedure when appropriate:1}  Medications Ordered in ED Medications  predniSONE (DELTASONE) tablet 40 mg (has no administration in time range)  albuterol (VENTOLIN HFA) 108 (90 Base) MCG/ACT inhaler 2 puff (has no administration in time range)  AeroChamber Plus Flo-Vu Large MISC 1 each (has no administration in time range)    ED Course/ Medical Decision Making/ A&P Clinical Course as of 01/28/23 1159  Mon Jan 28, 2023  1154 Creatinine(!): 1.52 Baseline 1.1 three years ago [RP]    Clinical Course User Index [RP] Rondel Baton, MD   {   Click here for  ABCD2, HEART and other calculatorsREFRESH Note before signing :1}                          Medical Decision Making Amount and/or Complexity of Data Reviewed Labs: ordered. Decision-making details documented in ED Course. Radiology: ordered.  Risk Prescription drug management.   ***  {Document critical care time when appropriate:1} {Document review of labs and clinical decision tools ie heart score, Chads2Vasc2 etc:1}  {Document your independent review of radiology images, and any outside records:1} {Document your discussion with family members, caretakers, and with consultants:1} {Document social determinants of health affecting pt's care:1} {Document your decision making why or why not admission, treatments were needed:1} Final Clinical Impression(s) / ED Diagnoses Final diagnoses:  None    Rx / DC Orders ED Discharge Orders     None

## 2023-01-28 NOTE — ED Triage Notes (Signed)
C/o pain in the back of his head off and on c/o sob and lower ext swelling onset 2-3 weeks ago, no change today.

## 2023-01-28 NOTE — Progress Notes (Addendum)
Advanced Heart Failure Team Consult Note   Primary Physician: Wanda Plump, MD PCP-Cardiologist:  None  Reason for Consultation: Acute CHF  HPI:    Michael Willis is seen today for evaluation of acute CHF at the request of Dr. Eloise Harman with Emergency Medicine. 47 y.o. male with history of DM II, HTN, hyperlipidemia, obesity.  Hasn't not seen a provider in the system since 2021. Not taking any blood pressure or diabetes medications for several years.  Presented to the ED today with dyspnea, lower extremity edema and headache.  Labs: Scr 1.5 (1.1 in 10/20), CO2 25, Hgb 14.1, WBC 11.2, BNP 1,566, lactic acid 1.6, UA with proteinuria. CXR with hazy right basilar airspace disease which may be d/t atelectasis vs PNA. BP significantly elevated. Given albuterol, prednisone, po furosemide and losartan.   Advanced Heart Failure consulted for suspected acute systolic CHF.  Reports dyspnea with exertion for about a month. Over the last few weeks he's developed orthopnea and PND and the last 3 days noticed lower extremity edema. No chest pain. He's had an occipital headache for a couple of days. Does not check his blood pressure at home. Wife states that he snores.  He runs a Sales executive. Does not have insurance.  No ETOH or tobacco use. Uses marijuana.   Mother has CHF. No other relatives with CHF. No family history of SCD.   Home Medications Prior to Admission medications   Medication Sig Start Date End Date Taking? Authorizing Provider  amLODipine (NORVASC) 10 MG tablet Take 1 tablet (10 mg total) by mouth daily. Patient not taking: Reported on 02/05/2020 11/12/19   Wanda Plump, MD  aspirin EC 81 MG EC tablet Take 1 tablet (81 mg total) by mouth daily. Patient not taking: Reported on 02/05/2020 06/17/16   Lonia Blood, MD  atorvastatin (LIPITOR) 20 MG tablet Take 1 tablet (20 mg total) by mouth at bedtime. Patient not taking: Reported on 02/05/2020 05/08/19   Wanda Plump, MD  blood  glucose meter kit and supplies Dispense based on patient and insurance preference. Check blood sugar three times daily. DX: E11.9 05/29/19   Wanda Plump, MD  glucose blood test strip Check blood sugar three times daily 11/29/16   Wanda Plump, MD  insulin NPH Human (NOVOLIN N) 100 UNIT/ML injection 36 units before breakfast, 21 units before dinner Patient not taking: Reported on 02/05/2020 07/09/19   Wanda Plump, MD  INSULIN SYRINGE .5CC/28G 28G X 1/2" 0.5 ML MISC 1 Syringe by Does not apply route 2 (two) times daily. Patient not taking: Reported on 02/05/2020 06/16/16   Lonia Blood, MD  Lancets [facility] ULTRASOFT) lancets Check blood sugar three times daily 06/20/16   Wanda Plump, MD  losartan-hydrochlorothiazide [facility]) 100-12.5 MG tablet Take 1 tablet by mouth daily. Patient not taking: Reported on 02/05/2020 10/05/19   Wanda Plump, MD  metroNIDAZOLE (FLAGYL) 250 MG tablet Take 4 tablets at once with food. No alcohol within 24 hours of taking this medicine. 02/05/20   Mliss Sax, MD  sitaGLIPtin-metformin (JANUMET) 50-1000 MG tablet Take 1 tablet by mouth 2 (two) times daily with a meal. Patient not taking: Reported on 02/05/2020 05/08/19   Wanda Plump, MD    Past Medical History: Past Medical History:  Diagnosis Date   Diabetes (HCC) 01/05/2013   Elevated LFTs 01/05/2013   HTN (hypertension) 12/12/2012   Hyperlipidemia 12/04/2013   Obesity    Tinea pedis, onychomycosis 01/05/2013  Past Surgical History: Past Surgical History:  Procedure Laterality Date   NO PAST SURGERIES      Family History: Family History  Problem Relation Age of Onset   CAD Mother 72       MI?   Diabetes Neg Hx    Stroke Neg Hx    Colon cancer Neg Hx    Prostate cancer Neg Hx     Social History: Social History   Socioeconomic History   Marital status: Married    Spouse name: Not on file   Number of children: 5   Years of education: Not on file   Highest education level: Not on file   Occupational History   Occupation: yard work  Tobacco Use   Smoking status: Former    Types: Cigars    Quit date: 09/18/2000    Years since quitting: 22.3   Smokeless tobacco: Never   Tobacco comments:    smoke a few cigars off an on .   Substance and Sexual Activity   Alcohol use: Yes    Comment: socially   Drug use: No   Sexual activity: Not on file  Other Topics Concern   Not on file  Social History Narrative   Married , household: pt, wife, wife's son   Exercise: none   Diet: regular   Social Determinants of Health   Financial Resource Strain: Not on file  Food Insecurity: Not on file  Transportation Needs: Not on file  Physical Activity: Not on file  Stress: Not on file  Social Connections: Not on file    Allergies:  No Known Allergies  Objective:    Vital Signs:   Temp:  [98.1 F (36.7 C)] 98.1 F (36.7 C) (06/10 1247) Pulse Rate:  [95-108] 108 (06/10 1500) Resp:  [14-27] 20 (06/10 1500) BP: (170-187)/(116-139) 187/139 (06/10 1500) SpO2:  [96 %-99 %] 97 % (06/10 1500) Weight:  [117.9 kg] 117.9 kg (06/10 0946)    Weight change: Filed Weights   01/28/23 0946  Weight: 117.9 kg    Intake/Output:  No intake or output data in the 24 hours ending 01/28/23 1546    Physical Exam    General:  Appears dyspneic. HEENT: normal Neck: supple. JVP difficult d/t neck size. Carotids 2+ bilat; no bruits.  Cor: PMI nondisplaced. Regular rate & rhythm, tachy. No rubs, gallops or murmurs. Lungs: clear Abdomen: soft, nontender, + distended.  Extremities: no cyanosis, clubbing, rash, 2+ edema Neuro: alert & orientedx3. Affect pleasant   Telemetry   Sinus tach 100s  EKG    SR 99 bpm  Labs   Basic Metabolic Panel: Recent Labs  Lab 01/28/23 0952  NA 137  K 4.3  CL 102  CO2 25  GLUCOSE 167*  BUN 19  CREATININE 1.52*  CALCIUM 9.2    Liver Function Tests: Recent Labs  Lab 01/28/23 0952  AST 32  ALT 58*  ALKPHOS 39  BILITOT 1.5*  PROT 6.3*   ALBUMIN 3.4*   No results for input(s): "LIPASE", "AMYLASE" in the last 168 hours. No results for input(s): "AMMONIA" in the last 168 hours.  CBC: Recent Labs  Lab 01/28/23 0952  WBC 11.2*  NEUTROABS 7.2  HGB 14.1  HCT 43.9  MCV 89.4  PLT 196    Cardiac Enzymes: No results for input(s): "CKTOTAL", "CKMB", "CKMBINDEX", "TROPONINI" in the last 168 hours.  BNP: BNP (last 3 results) Recent Labs    01/28/23 0952  BNP 1,566.1*    ProBNP (last 3  results) No results for input(s): "PROBNP" in the last 8760 hours.   CBG: No results for input(s): "GLUCAP" in the last 168 hours.  Coagulation Studies: No results for input(s): "LABPROT", "INR" in the last 72 hours.   Imaging   DG Chest 2 View  Result Date: 01/28/2023 CLINICAL DATA:  Back pain, shortness of breath EXAM: CHEST - 2 VIEW COMPARISON:  10/23/2018 FINDINGS: Hazy right basilar airspace disease which may reflect atelectasis versus pneumonia. No pleural effusion or pneumothorax. Mild cardiomegaly. No acute osseous abnormality. IMPRESSION: 1. Hazy right basilar airspace disease which may reflect atelectasis versus pneumonia. Electronically Signed   By: Elige Ko M.D.   On: 01/28/2023 10:59     Medications:     Current Medications:   Infusions:    Assessment/Plan   1. Acute CHF: -Suspect systolic CHF -Check echo -May be d/t uncontrolled HTN. Has risk factors for CAD. Will need ischemic workup once diuresed. -Sleep study as outpatient -Check TSH -Lactic acid 1.6 -NYHA III. Appears volume overloaded. Start IV lasix 60 BID -Received 50 mg losartan this afternoon. Start Entresto 24/26 mg BID tomorrow. -Start 12.5 spiro daily -SGLT2i next -Hold off on beta blocker for now with acute CHF and tachycardia  2. Uncontrolled HTN: -PRN IV hydralazine for SBP > 160 -GDMT as above  3. DM II -Has been off meds for at least a few years -Check A1c  4. Obesity  -BMI 36.78 -Sleep study as outpatient -Would  benefit from GLP-1 agonist to assist with weight loss  5. Possible AKI - Scr 1.5, 1.1 several years ago - Follow with diuresis  SDOH: Uninsured. Consult HF TOC CM. Screen for medicaid. Will need assistance with medications.    Length of Stay: 0  FINCH, LINDSAY N, PA-C  01/28/2023, 3:46 PM  Advanced Heart Failure Team Pager [phone] (M-F; 7a - 5p)  Please contact [facility] for night-coverage after hours (4p -7a ) and weekends on [email or web address]   Patient seen and examined with the above-signed Advanced Practice Provider and/or Housestaff. I personally reviewed laboratory data, imaging studies and relevant notes. I independently examined the patient and formulated the important aspects of the plan. I have edited the note to reflect any of my changes or salient points. I have personally discussed the plan with the patient and/or family.  47 y/o male with obesity, uncontrolled HTN/DM2 admitted with several weeks of HF symptoms.   Bedside echo done personally with EF ~25% and moderate to severe RV dysfunction.   SBP on admit > 190. Scr 1.5 (up from 1.1) \. Lactate 1.6 Hstrop ok ECG sinus tach with LVH. Qrs 86 ms  General:  Sitting up in bed No resp difficulty HEENT: normal Neck: supple. JVP to ear Carotids 2+ bilat; no bruits. No lymphadenopathy or thryomegaly appreciated. Cor: Regular rate & rhythm. No rubs, gallops or murmurs. Lungs: crackles at bases L > R Abdomen: obese soft, nontender, nondistended. No hepatosplenomegaly. No bruits or masses. Good bowel sounds. Extremities: no cyanosis, clubbing, rash, 2-3+ edema Neuro: alert & orientedx3, cranial nerves grossly intact. moves all 4 extremities w/o difficulty. Affect pleasant  He has acute systolic HF in setting of hypertensive urgency. Agree with plan as above for BP control and start GDMT. Goal SBP 130-140. Will need R/L cath prior to d/c. Appreciate TRH management of DM2 and associated issues.   Arvilla Meres, MD  8:29  PM

## 2023-01-29 ENCOUNTER — Inpatient Hospital Stay (HOSPITAL_COMMUNITY): Payer: Medicaid Other

## 2023-01-29 ENCOUNTER — Other Ambulatory Visit (HOSPITAL_COMMUNITY): Payer: Self-pay

## 2023-01-29 DIAGNOSIS — I509 Heart failure, unspecified: Secondary | ICD-10-CM

## 2023-01-29 DIAGNOSIS — I43 Cardiomyopathy in diseases classified elsewhere: Secondary | ICD-10-CM | POA: Diagnosis present

## 2023-01-29 DIAGNOSIS — E669 Obesity, unspecified: Secondary | ICD-10-CM | POA: Diagnosis present

## 2023-01-29 DIAGNOSIS — I10 Essential (primary) hypertension: Secondary | ICD-10-CM | POA: Diagnosis not present

## 2023-01-29 DIAGNOSIS — N179 Acute kidney failure, unspecified: Secondary | ICD-10-CM | POA: Diagnosis present

## 2023-01-29 DIAGNOSIS — I428 Other cardiomyopathies: Secondary | ICD-10-CM | POA: Diagnosis present

## 2023-01-29 DIAGNOSIS — I110 Hypertensive heart disease with heart failure: Secondary | ICD-10-CM | POA: Diagnosis present

## 2023-01-29 DIAGNOSIS — I160 Hypertensive urgency: Secondary | ICD-10-CM | POA: Diagnosis present

## 2023-01-29 DIAGNOSIS — I5021 Acute systolic (congestive) heart failure: Secondary | ICD-10-CM

## 2023-01-29 DIAGNOSIS — E785 Hyperlipidemia, unspecified: Secondary | ICD-10-CM | POA: Diagnosis present

## 2023-01-29 DIAGNOSIS — Z8249 Family history of ischemic heart disease and other diseases of the circulatory system: Secondary | ICD-10-CM | POA: Diagnosis not present

## 2023-01-29 DIAGNOSIS — Z597 Insufficient social insurance and welfare support: Secondary | ICD-10-CM | POA: Diagnosis not present

## 2023-01-29 DIAGNOSIS — E119 Type 2 diabetes mellitus without complications: Secondary | ICD-10-CM | POA: Diagnosis present

## 2023-01-29 DIAGNOSIS — Z87891 Personal history of nicotine dependence: Secondary | ICD-10-CM | POA: Diagnosis not present

## 2023-01-29 DIAGNOSIS — J9811 Atelectasis: Secondary | ICD-10-CM | POA: Diagnosis present

## 2023-01-29 DIAGNOSIS — Z6837 Body mass index (BMI) 37.0-37.9, adult: Secondary | ICD-10-CM | POA: Diagnosis not present

## 2023-01-29 DIAGNOSIS — K761 Chronic passive congestion of liver: Secondary | ICD-10-CM | POA: Diagnosis present

## 2023-01-29 DIAGNOSIS — Z79899 Other long term (current) drug therapy: Secondary | ICD-10-CM | POA: Diagnosis not present

## 2023-01-29 DIAGNOSIS — I3139 Other pericardial effusion (noninflammatory): Secondary | ICD-10-CM | POA: Diagnosis not present

## 2023-01-29 DIAGNOSIS — Z7982 Long term (current) use of aspirin: Secondary | ICD-10-CM | POA: Diagnosis not present

## 2023-01-29 LAB — CBC
HCT: 43.4 % (ref 39.0–52.0)
Hemoglobin: 14.6 g/dL (ref 13.0–17.0)
MCH: 28.3 pg (ref 26.0–34.0)
MCHC: 33.6 g/dL (ref 30.0–36.0)
MCV: 84.1 fL (ref 80.0–100.0)
Platelets: 220 10*3/uL (ref 150–400)
RBC: 5.16 MIL/uL (ref 4.22–5.81)
RDW: 12.3 % (ref 11.5–15.5)
WBC: 14 10*3/uL — ABNORMAL HIGH (ref 4.0–10.5)
nRBC: 0 % (ref 0.0–0.2)

## 2023-01-29 LAB — HEPATIC FUNCTION PANEL
ALT: 60 U/L — ABNORMAL HIGH (ref 0–44)
AST: 37 U/L (ref 15–41)
Albumin: 3.5 g/dL (ref 3.5–5.0)
Alkaline Phosphatase: 37 U/L — ABNORMAL LOW (ref 38–126)
Bilirubin, Direct: 0.3 mg/dL — ABNORMAL HIGH (ref 0.0–0.2)
Indirect Bilirubin: 1.2 mg/dL — ABNORMAL HIGH (ref 0.3–0.9)
Total Bilirubin: 1.5 mg/dL — ABNORMAL HIGH (ref 0.3–1.2)
Total Protein: 6.4 g/dL — ABNORMAL LOW (ref 6.5–8.1)

## 2023-01-29 LAB — ECHOCARDIOGRAM COMPLETE
AR max vel: 3.41 cm2
AV Area VTI: 3.19 cm2
AV Area mean vel: 3.2 cm2
AV Mean grad: 3 mmHg
AV Peak grad: 5.8 mmHg
Ao pk vel: 1.2 m/s
Height: 70.5 in
S' Lateral: 5 cm
Weight: 4195.2 oz

## 2023-01-29 LAB — BASIC METABOLIC PANEL
Anion gap: 12 (ref 5–15)
BUN: 22 mg/dL — ABNORMAL HIGH (ref 6–20)
CO2: 28 mmol/L (ref 22–32)
Calcium: 9.4 mg/dL (ref 8.9–10.3)
Chloride: 99 mmol/L (ref 98–111)
Creatinine, Ser: 1.65 mg/dL — ABNORMAL HIGH (ref 0.61–1.24)
GFR, Estimated: 52 mL/min — ABNORMAL LOW (ref >60)
Glucose, Bld: 203 mg/dL — ABNORMAL HIGH (ref 70–99)
Potassium: 4.5 mmol/L (ref 3.5–5.1)
Sodium: 139 mmol/L (ref 135–145)

## 2023-01-29 LAB — LIPID PANEL
Cholesterol: 226 mg/dL — ABNORMAL HIGH (ref 0–200)
HDL: 46 mg/dL (ref >40)
LDL Cholesterol: 161 mg/dL — ABNORMAL HIGH (ref 0–99)
Total CHOL/HDL Ratio: 4.9 RATIO
Triglycerides: 93 mg/dL (ref <150)
VLDL: 19 mg/dL (ref 0–40)

## 2023-01-29 LAB — HEMOGLOBIN A1C
Hgb A1c MFr Bld: 6.6 % — ABNORMAL HIGH (ref 4.8–5.6)
Mean Plasma Glucose: 142.72 mg/dL

## 2023-01-29 LAB — HIV ANTIBODY (ROUTINE TESTING W REFLEX): HIV Screen 4th Generation wRfx: NONREACTIVE

## 2023-01-29 LAB — GLUCOSE, CAPILLARY
Glucose-Capillary: 198 mg/dL — ABNORMAL HIGH (ref 70–99)
Glucose-Capillary: 216 mg/dL — ABNORMAL HIGH (ref 70–99)
Glucose-Capillary: 200 mg/dL — ABNORMAL HIGH (ref 70–99)

## 2023-01-29 LAB — TSH: TSH: 0.496 u[IU]/mL (ref 0.350–4.500)

## 2023-01-29 MED ORDER — ATORVASTATIN CALCIUM 40 MG PO TABS
40.0000 mg | ORAL_TABLET | Freq: Every day | ORAL | Status: DC
  Administered 2023-01-29: 40 mg via ORAL
  Filled 2023-01-29: qty 1

## 2023-01-29 MED ORDER — SPIRONOLACTONE 12.5 MG HALF TABLET
12.5000 mg | ORAL_TABLET | Freq: Every day | ORAL | Status: DC
  Administered 2023-01-29 – 2023-01-30 (×2): 12.5 mg via ORAL
  Filled 2023-01-29 (×2): qty 1

## 2023-01-29 MED ORDER — INSULIN ASPART 100 UNIT/ML IJ SOLN
0.0000 [IU] | Freq: Three times a day (TID) | INTRAMUSCULAR | Status: DC
  Administered 2023-01-29: 3 [IU] via SUBCUTANEOUS
  Administered 2023-01-30: 2 [IU] via SUBCUTANEOUS
  Administered 2023-01-30: 1 [IU] via SUBCUTANEOUS
  Administered 2023-01-30: 3 [IU] via SUBCUTANEOUS
  Administered 2023-01-31: 2 [IU] via SUBCUTANEOUS
  Administered 2023-01-31 – 2023-02-01 (×3): 1 [IU] via SUBCUTANEOUS
  Administered 2023-02-01: 2 [IU] via SUBCUTANEOUS

## 2023-01-29 MED ORDER — ASPIRIN 81 MG PO CHEW
81.0000 mg | CHEWABLE_TABLET | ORAL | Status: AC
  Administered 2023-01-30: 81 mg via ORAL
  Filled 2023-01-29: qty 1

## 2023-01-29 MED ORDER — SODIUM CHLORIDE 0.9% FLUSH: 3.0000 mL | INTRAVENOUS | Status: DC | PRN

## 2023-01-29 MED ORDER — FUROSEMIDE 10 MG/ML IJ SOLN
40.0000 mg | Freq: Two times a day (BID) | INTRAMUSCULAR | Status: AC
  Administered 2023-01-29: 40 mg via INTRAVENOUS
  Filled 2023-01-29: qty 4

## 2023-01-29 MED ORDER — ATORVASTATIN CALCIUM 80 MG PO TABS
80.0000 mg | ORAL_TABLET | Freq: Every day | ORAL | Status: DC
  Administered 2023-01-30 – 2023-02-01 (×3): 80 mg via ORAL
  Filled 2023-01-29 (×3): qty 1

## 2023-01-29 MED ORDER — SODIUM CHLORIDE 0.9% FLUSH
3.0000 mL | Freq: Two times a day (BID) | INTRAVENOUS | Status: DC
  Administered 2023-01-29 – 2023-02-01 (×3): 3 mL via INTRAVENOUS

## 2023-01-29 MED ORDER — SODIUM CHLORIDE 0.9 % IV SOLN: INTRAVENOUS | Status: DC

## 2023-01-29 MED ORDER — SODIUM CHLORIDE 0.9 % IV SOLN: 250.0000 mL | INTRAVENOUS | Status: DC | PRN

## 2023-01-29 MED ORDER — EMPAGLIFLOZIN 10 MG PO TABS
10.0000 mg | ORAL_TABLET | Freq: Every day | ORAL | Status: DC
  Administered 2023-01-29 – 2023-01-31 (×2): 10 mg via ORAL
  Filled 2023-01-29 (×3): qty 1

## 2023-01-29 MED ORDER — PERFLUTREN LIPID MICROSPHERE
1.0000 mL | INTRAVENOUS | Status: AC | PRN
  Administered 2023-01-29: 3 mL via INTRAVENOUS

## 2023-01-29 NOTE — Plan of Care (Signed)
  Problem: Activity: Goal: Risk for activity intolerance will decrease Outcome: Progressing   Problem: Coping: Goal: Level of anxiety will decrease Outcome: Progressing   Problem: Elimination: Goal: Will not experience complications related to urinary retention Outcome: Progressing   

## 2023-01-29 NOTE — Progress Notes (Addendum)
PROGRESS NOTE    Michael Willis  WUJ:[id number] DOB: Jan 13, 1976 DOA: 01/28/2023 PCP: Wanda Plump, MD   Brief Narrative: Michael Willis is a 47 y.o. male with a history of diabetes, hypertension.  Patient presented secondary to persistent dyspnea on exertion with development of bilateral lower extremity edema.  On admission, patient was found to have evidence of 6 severely elevated hypertension patient evidence of heart failure.  Advanced heart failure team was consulted on admission with bedside echocardiogram suggesting LVEF of 25%.  Patient started on IV diuresis and antihypertensive medications.   Assessment and Plan:  Acute systolic heart failure Presentation consistent with heart failure.  Advanced heart team performed bedside echo with evidence of reduced LVEF of 25%.  Likely hypertensive cardiomyopathy with as etiology. -Cardiology recommendations: Entresto, spironolactone Lasix 60 mg twice daily, formal echocardiogram, left and right heart catheterization, Jardiance, outpatient sleep study -Patient will need medication assistance per heart failure team for medications on discharge   Severe asymptomatic hypertension Present on admission.  Currently no symptoms.  Patient received losartan 50 mg x 1 and is now started on Entresto per cardiology in addition to Lasix diuresis. Blood pressure improved. -Cardiology recommendations: Entresto, Lasix IV twice daily, hydralazine IV as needed   Diabetes mellitus type 2 Controlled based on hemoglobin A1C of 6.6%.  Patient currently take medication although previously prescribed.  Glucose moderately elevated on nonfasting basic metabolic panel. -SSI   AKI Most recent baseline creatinine of 1.1 from 4 years prior.  Possible AKI versus possible CKD.  Patient with a creatinine of 1.52 on admission and normal BUN.  Urinalysis significant for mild protein and small hemoglobin.  If an AKI, likely related to fluid overload from heart failure.   Patient currently started on diuresis per cardiology. Creatinine slightly bumped up to 1.65 with BUN up to 22. -BMP daily  Hyperbilirubinemia Mixed.  Possibly rated to fluid overload and resultant liver congestion.  Patient is asymptomatic.  Associated mildly elevated ALT of 60. -Follow-up bilirubin for improvement over time  Hyperlipidemia LDL of 161 noted on this morning's lipid panel.  Patient started on Lipitor per cardiology -Cardiology recommendations: Lipitor 40 mg daily   Abnormal x-ray Concern for possible pneumonia versus atelectasis. Patient's history not consistent with pneumonia. There may be a component of edema. -Incentive spirometer   Obesity Estimated body mass index is 37.09 kg/m as calculated from the following:   Height as of this encounter: 5' 10.5" (1.791 m).   Weight as of this encounter: 118.9 kg.  DVT prophylaxis: Lovenox Code Status:   Code Status: Full Code Family Communication: Wife at bedside Disposition Plan: Discharge home pending continued cardiology recommendations   Consultants:  Cardiology/advanced heart failure  Procedures:  None  Antimicrobials: None   Subjective: Patient reports improvement of dyspnea.  He has been up and moving around without issues.  No chest pain.  Objective: BP (!) 150/104 (BP Location: Right Arm)   Pulse (!) 101   Temp 98 F (36.7 C) (Oral)   Resp 18   Ht 5' 10.5" (1.791 m)   Wt 118.9 kg   SpO2 95%   BMI 37.09 kg/m   Examination:  General exam: Appears calm and comfortable Respiratory system: Clear to auscultation. Respiratory effort normal. Cardiovascular system: S1 & S2 heard, RRR. Gastrointestinal system: Abdomen is nondistended, soft and nontender. Normal bowel sounds heard. Central nervous system: Alert and oriented. Musculoskeletal: No calf tenderness Skin: No cyanosis. No rashes Psychiatry: Judgement and insight appear normal. Mood &  affect appropriate.    Data Reviewed: I have  personally reviewed following labs and imaging studies  CBC Lab Results  Component Value Date   WBC 14.0 (H) 01/29/2023   RBC 5.16 01/29/2023   HGB 14.6 01/29/2023   HCT 43.4 01/29/2023   MCV 84.1 01/29/2023   MCH 28.3 01/29/2023   PLT 220 01/29/2023   MCHC 33.6 01/29/2023   RDW 12.3 01/29/2023   LYMPHSABS 2.9 01/28/2023   MONOABS 0.9 01/28/2023   EOSABS 0.1 01/28/2023   BASOSABS 0.1 01/28/2023     Last metabolic panel Lab Results  Component Value Date   NA 139 01/29/2023   K 4.5 01/29/2023   CL 99 01/29/2023   CO2 28 01/29/2023   BUN 22 (H) 01/29/2023   CREATININE 1.65 (H) 01/29/2023   GLUCOSE 203 (H) 01/29/2023   GFRNONAA 52 (L) 01/29/2023   GFRAA >60 10/23/2018   CALCIUM 9.4 01/29/2023   PROT 6.3 (L) 01/28/2023   ALBUMIN 3.4 (L) 01/28/2023   BILITOT 1.5 (H) 01/28/2023   ALKPHOS 39 01/28/2023   AST 32 01/28/2023   ALT 58 (H) 01/28/2023   ANIONGAP 12 01/29/2023    GFR: Estimated Creatinine Clearance: 72.9 mL/min (A) (by C-G formula based on SCr of 1.65 mg/dL (H)).  No results found for this or any previous visit (from the past 240 hour(s)).    Radiology Studies: DG Chest 2 View  Result Date: 01/28/2023 CLINICAL DATA:  Back pain, shortness of breath EXAM: CHEST - 2 VIEW COMPARISON:  10/23/2018 FINDINGS: Hazy right basilar airspace disease which may reflect atelectasis versus pneumonia. No pleural effusion or pneumothorax. Mild cardiomegaly. No acute osseous abnormality. IMPRESSION: 1. Hazy right basilar airspace disease which may reflect atelectasis versus pneumonia. Electronically Signed   By: Elige Ko M.D.   On: 01/28/2023 10:59      LOS: 0 days    Jacquelin Hawking, MD [facility] 01/29/2023, 7:27 AM   If 7PM-7AM, please contact night-coverage [email or web address]

## 2023-01-29 NOTE — Progress Notes (Signed)
RN obtained signed informed consent with patient and patient's wife at the bedside. RN made patient aware that patient is to be NPO at midnight, receive second PIV for procedure and to clip radial and groin site per procedural orders. Patient verbalize understanding. Patient request IV team to place second PIV, RN placed IV team order.

## 2023-01-29 NOTE — Progress Notes (Addendum)
Advanced Heart Failure Rounding Note  PCP-Cardiologist: None   Subjective:    Feels fine this morning, sitting on EOB, wife at bedside.   -3.4 L UOP documented + 5 unmeasured occurrences. Down 4 lbs.   Objective:   Weight Range: 118.9 kg Body mass index is 37.09 kg/m.   Vital Signs:   Temp:  [97.8 F (36.6 C)-98.1 F (36.7 C)] 98 F (36.7 C) (06/11 0749) Pulse Rate:  [95-117] 106 (06/11 0819) Resp:  [14-32] 16 (06/11 0819) BP: (138-194)/(87-139) 168/99 (06/11 0819) SpO2:  [94 %-99 %] 94 % (06/11 0819) Weight:  [117.9 kg-120.8 kg] 118.9 kg (06/11 0425) Last BM Date : 01/28/23  Weight change: Filed Weights   01/28/23 0946 01/28/23 2000 01/29/23 0425  Weight: 117.9 kg 120.8 kg 118.9 kg    Intake/Output:   Intake/Output Summary (Last 24 hours) at 01/29/2023 0827 Last data filed at 01/29/2023 0428 Gross per 24 hour  Intake 600 ml  Output 3470 ml  Net -2870 ml      Physical Exam  General:  well appearing.  No respiratory difficulty HEENT: normal Neck: supple. JVD difficult to see, thick neck. Carotids 2+ bilat; no bruits. No lymphadenopathy or thyromegaly appreciated. Cor: PMI nondisplaced. Tachy rate & reg rhythm. No rubs, gallops or murmurs. Lungs: clear Abdomen: soft, nontender, nondistended. No hepatosplenomegaly. No bruits or masses. Good bowel sounds. Extremities: no cyanosis, clubbing, rash, +1 BLE edema  Neuro: alert & oriented x 3, cranial nerves grossly intact. moves all 4 extremities w/o difficulty. Affect pleasant.   Telemetry   ST low 100s (Personally reviewed)    EKG    No new EKG to review  Labs    CBC Recent Labs    01/28/23 0952 01/29/23 0036  WBC 11.2* 14.0*  NEUTROABS 7.2  --   HGB 14.1 14.6  HCT 43.9 43.4  MCV 89.4 84.1  PLT 196 220   Basic Metabolic Panel Recent Labs    40/98/11 0952 01/29/23 0036  NA 137 139  K 4.3 4.5  CL 102 99  CO2 25 28  GLUCOSE 167* 203*  BUN 19 22*  CREATININE 1.52* 1.65*  CALCIUM 9.2  9.4   Liver Function Tests Recent Labs    01/28/23 0952  AST 32  ALT 58*  ALKPHOS 39  BILITOT 1.5*  PROT 6.3*  ALBUMIN 3.4*   No results for input(s): "LIPASE", "AMYLASE" in the last 72 hours. Cardiac Enzymes No results for input(s): "CKTOTAL", "CKMB", "CKMBINDEX", "TROPONINI" in the last 72 hours.  BNP: BNP (last 3 results) Recent Labs    01/28/23 0952  BNP 1,566.1*    ProBNP (last 3 results) No results for input(s): "PROBNP" in the last 8760 hours.   D-Dimer No results for input(s): "DDIMER" in the last 72 hours. Hemoglobin A1C Recent Labs    01/29/23 0036  HGBA1C 6.6*   Fasting Lipid Panel Recent Labs    01/29/23 0036  CHOL 226*  HDL 46  LDLCALC 161*  TRIG 93  CHOLHDL 4.9   Thyroid Function Tests Recent Labs    01/29/23 0036  TSH 0.496    Other results:   Imaging    DG Chest 2 View  Result Date: 01/28/2023 CLINICAL DATA:  Back pain, shortness of breath EXAM: CHEST - 2 VIEW COMPARISON:  10/23/2018 FINDINGS: Hazy right basilar airspace disease which may reflect atelectasis versus pneumonia. No pleural effusion or pneumothorax. Mild cardiomegaly. No acute osseous abnormality. IMPRESSION: 1. Hazy right basilar airspace disease which may reflect  atelectasis versus pneumonia. Electronically Signed   By: Elige Ko M.D.   On: 01/28/2023 10:59     Medications:     Scheduled Medications:  enoxaparin (LOVENOX) injection  40 mg Subcutaneous Q24H   furosemide  60 mg Intravenous BID   sacubitril-valsartan  1 tablet Oral BID    Infusions:   PRN Medications: acetaminophen **OR** acetaminophen, hydrALAZINE, ondansetron **OR** ondansetron (ZOFRAN) IV    Patient Profile   Michael Willis is a 47 y.o.  male with history of DM II, HTN, hyperlipidemia, obesity. AHF team to see for acute CHF.   Assessment/Plan  1. Acute systolic heart failure - May be d/t uncontrolled HTN. Has risk factors for CAD. Will need ischemic workup once diuresed. -  Bedside echo done by Dr. Gala Romney 6/10 with EF ~25% and moderate to severe RV dysfunction.  - formal echo pending today - Lactic acid 1.6 - NYHA III. Volume still slightly elevated, continue IV lasix 60 BID - Start Entresto 24/26 mg BID. - Start 12.5 mg spiro daily - Start Jardiance 10 mg daily, Hgb A1c 6.6 - will need L/RHC this admission, renal function slightly elevated - Hold off on beta blocker for now with acute CHF  - strict I&O, daily weights   2. Uncontrolled HTN -PRN IV hydralazine for SBP > 160 -GDMT as above   3. DM II -Has been off meds for at least a few years -A1c 6.6   4. Obesity  -Body mass index is 37.09 kg/m.  -Sleep study as outpatient -Would benefit from GLP-1 agonist to assist with weight loss   5. Possible AKI - unknown baseline - Scr 1.65, 1.1 several years ago - expected rise in SCr with GDMT - Follow with diuresis  6. Hyperlipidemia - LDL 161 - start statin   SDOH: Uninsured. Consult HF TOC CM. Screen for medicaid. Will need assistance with medications.   Addendum 12:49 PM  -4.2 L UOP so far with 60 IV lasix. Will decreased PM dose to 40 IV and may be ready to PO tomorrow. Will reassess tomorrow.   Length of Stay: 0  Alen Bleacher, NP  01/29/2023, 8:27 AM  Advanced Heart Failure Team Pager [phone] (M-F; 7a - 5p)  Please contact [facility] for night-coverage after hours (5p -7a ) and weekends on [email or web address]   Patient seen and examined with the above-signed Advanced Practice Provider and/or Housestaff. I personally reviewed laboratory data, imaging studies and relevant notes. I independently examined the patient and formulated the important aspects of the plan. I have edited the note to reflect any of my changes or salient points. I have personally discussed the plan with the patient and/or family.  Breathing improved with diuresis. BP improving with initiation of GDMT. No orthopnea or PND  Echo with EF 25-30% severe LVH mild RV  dysfunction (Personally reviewed)  General: Sitting up in bed. No resp difficulty HEENT: normal Neck: supple. JVP 6-7 Carotids 2+ bilat; no bruits. No lymphadenopathy or thryomegaly appreciated. Cor: PMI nondisplaced. Regular rate & rhythm. No rubs, gallops or murmurs. Lungs: clear Abdomen: obese soft, nontender, nondistended. No hepatosplenomegaly. No bruits or masses. Good bowel sounds. Extremities: no cyanosis, clubbing, rash, edema Neuro: alert & orientedx3, cranial nerves grossly intact. moves all 4 extremities w/o difficulty. Affect pleasant  Continue diuresis one more day. Titrate GDMT for goal SB R 130-140. Plan R/L cath tomorrow. Procedure d/w him and his wife.   Arvilla Meres, MD  9:37 PM

## 2023-01-29 NOTE — Plan of Care (Signed)
  Problem: Education: Goal: Knowledge of General Education information will improve Description: Including pain rating scale, medication(s)/side effects and non-pharmacologic comfort measures Outcome: Progressing   Problem: Clinical Measurements: Goal: Ability to maintain clinical measurements within normal limits will improve Outcome: Progressing Goal: Will remain free from infection Outcome: Progressing   

## 2023-01-29 NOTE — Inpatient Diabetes Management (Addendum)
Inpatient Diabetes Program Recommendations  AACE/ADA: New Consensus Statement on Inpatient Glycemic Control (2015)  Target Ranges:  Prepandial:   less than 140 mg/dL      Peak postprandial:   less than 180 mg/dL (1-2 hours)      Critically ill patients:  140 - 180 mg/dL   Lab Results  Component Value Date   GLUCAP 242 (H) 10/23/2018   HGBA1C 6.6 (H) 01/29/2023    Diabetes history: DM2 Outpatient Diabetes medications: Jardiance 10 mg qd Current orders for Inpatient glycemic control: Jardiance 10 mg qd  Inpatient Diabetes Program Recommendations:   Please consider: -Glycemic control order set with Novolog 0-9 units tid, 0-5 units hs  Thank you, Darel Hong E. Zakariah Urwin, RN, MSN, CDE  Diabetes Coordinator Inpatient Glycemic Control Team Team Pager [phone] (8am-5pm) 01/29/2023 11:07 AM

## 2023-01-29 NOTE — Hospital Course (Signed)
Michael Willis is a 47 y.o. male with a history of diabetes, hypertension.  Patient presented secondary to persistent dyspnea on exertion with development of bilateral lower extremity edema.  On admission, patient was found to have evidence of 6 severely elevated hypertension patient evidence of heart failure.  Advanced heart failure team was consulted on admission with bedside echocardiogram suggesting LVEF of 25%.  Patient started on IV diuresis and antihypertensive medications.

## 2023-01-30 ENCOUNTER — Encounter (HOSPITAL_COMMUNITY)
Admission: EM | Disposition: A | Discharge status: Home / Self Care | Payer: Self-pay | Source: Home / Self Care | Attending: Family Medicine

## 2023-01-30 DIAGNOSIS — I10 Essential (primary) hypertension: Secondary | ICD-10-CM

## 2023-01-30 DIAGNOSIS — N179 Acute kidney failure, unspecified: Secondary | ICD-10-CM

## 2023-01-30 DIAGNOSIS — E669 Obesity, unspecified: Secondary | ICD-10-CM

## 2023-01-30 HISTORY — PX: RIGHT/LEFT HEART CATH AND CORONARY ANGIOGRAPHY: CATH118266

## 2023-01-30 LAB — CBC
HCT: 52 % (ref 39.0–52.0)
Hemoglobin: 17 g/dL (ref 13.0–17.0)
MCH: 28.2 pg (ref 26.0–34.0)
MCHC: 32.7 g/dL (ref 30.0–36.0)
MCV: 86.2 fL (ref 80.0–100.0)
Platelets: 241 10*3/uL (ref 150–400)
RBC: 6.03 MIL/uL — ABNORMAL HIGH (ref 4.22–5.81)
RDW: 12.4 % (ref 11.5–15.5)
WBC: 15.6 10*3/uL — ABNORMAL HIGH (ref 4.0–10.5)
nRBC: 0 % (ref 0.0–0.2)

## 2023-01-30 LAB — POCT I-STAT EG7
Acid-Base Excess: 3 mmol/L — ABNORMAL HIGH (ref 0.0–2.0)
Acid-Base Excess: 9 mmol/L — ABNORMAL HIGH (ref 0.0–2.0)
Bicarbonate: 28.4 mmol/L — ABNORMAL HIGH (ref 20.0–28.0)
Bicarbonate: 35.9 mmol/L — ABNORMAL HIGH (ref 20.0–28.0)
Calcium, Ion: 0.73 mmol/L — CL (ref 1.15–1.40)
Calcium, Ion: 1.11 mmol/L — ABNORMAL LOW (ref 1.15–1.40)
HCT: 40 % (ref 39.0–52.0)
HCT: 50 % (ref 39.0–52.0)
Hemoglobin: 13.6 g/dL (ref 13.0–17.0)
Hemoglobin: 17 g/dL (ref 13.0–17.0)
O2 Saturation: 66 %
O2 Saturation: 70 %
Potassium: 2.5 mmol/L — CL (ref 3.5–5.1)
Potassium: 3.5 mmol/L (ref 3.5–5.1)
Sodium: 142 mmol/L (ref 135–145)
Sodium: 150 mmol/L — ABNORMAL HIGH (ref 135–145)
TCO2: 30 mmol/L (ref 22–32)
TCO2: 38 mmol/L — ABNORMAL HIGH (ref 22–32)
pCO2, Ven: 45.5 mmHg (ref 44–60)
pCO2, Ven: 55 mmHg (ref 44–60)
pH, Ven: 7.403 (ref 7.25–7.43)
pH, Ven: 7.423 (ref 7.25–7.43)
pO2, Ven: 34 mmHg (ref 32–45)
pO2, Ven: 37 mmHg (ref 32–45)

## 2023-01-30 LAB — GLUCOSE, CAPILLARY
Glucose-Capillary: 160 mg/dL — ABNORMAL HIGH (ref 70–99)
Glucose-Capillary: 144 mg/dL — ABNORMAL HIGH (ref 70–99)
Glucose-Capillary: 234 mg/dL — ABNORMAL HIGH (ref 70–99)
Glucose-Capillary: 191 mg/dL — ABNORMAL HIGH (ref 70–99)

## 2023-01-30 LAB — POCT I-STAT 7, (LYTES, BLD GAS, ICA,H+H)
Acid-Base Excess: 7 mmol/L — ABNORMAL HIGH (ref 0.0–2.0)
Bicarbonate: 32 mmol/L — ABNORMAL HIGH (ref 20.0–28.0)
Calcium, Ion: 0.97 mmol/L — ABNORMAL LOW (ref 1.15–1.40)
HCT: 46 % (ref 39.0–52.0)
Hemoglobin: 15.6 g/dL (ref 13.0–17.0)
O2 Saturation: 93 %
Potassium: 3.1 mmol/L — ABNORMAL LOW (ref 3.5–5.1)
Sodium: 144 mmol/L (ref 135–145)
TCO2: 33 mmol/L — ABNORMAL HIGH (ref 22–32)
pCO2 arterial: 45.8 mmHg (ref 32–48)
pH, Arterial: 7.452 — ABNORMAL HIGH (ref 7.35–7.45)
pO2, Arterial: 66 mmHg — ABNORMAL LOW (ref 83–108)

## 2023-01-30 LAB — BASIC METABOLIC PANEL
Anion gap: 13 (ref 5–15)
BUN: 24 mg/dL — ABNORMAL HIGH (ref 6–20)
CO2: 33 mmol/L — ABNORMAL HIGH (ref 22–32)
Calcium: 9.9 mg/dL (ref 8.9–10.3)
Chloride: 94 mmol/L — ABNORMAL LOW (ref 98–111)
Creatinine, Ser: 1.79 mg/dL — ABNORMAL HIGH (ref 0.61–1.24)
GFR, Estimated: 47 mL/min — ABNORMAL LOW (ref >60)
Glucose, Bld: 160 mg/dL — ABNORMAL HIGH (ref 70–99)
Potassium: 3.6 mmol/L (ref 3.5–5.1)
Sodium: 140 mmol/L (ref 135–145)

## 2023-01-30 SURGERY — RIGHT/LEFT HEART CATH AND CORONARY ANGIOGRAPHY
Anesthesia: LOCAL

## 2023-01-30 MED ORDER — MIDAZOLAM HCL 2 MG/2ML IJ SOLN
INTRAMUSCULAR | Status: DC | PRN
  Administered 2023-01-30: 2 mg via INTRAVENOUS

## 2023-01-30 MED ORDER — SACUBITRIL-VALSARTAN 97-103 MG PO TABS: 1.0000 | ORAL_TABLET | Freq: Two times a day (BID) | ORAL | Status: DC

## 2023-01-30 MED ORDER — LABETALOL HCL 5 MG/ML IV SOLN: 10.0000 mg | INTRAVENOUS | Status: AC | PRN

## 2023-01-30 MED ORDER — MIDAZOLAM HCL 2 MG/2ML IJ SOLN
INTRAMUSCULAR | Status: AC
  Filled 2023-01-30: qty 2

## 2023-01-30 MED ORDER — HYDRALAZINE HCL 20 MG/ML IJ SOLN: 10.0000 mg | INTRAMUSCULAR | Status: AC | PRN

## 2023-01-30 MED ORDER — VERAPAMIL HCL 2.5 MG/ML IV SOLN
INTRAVENOUS | Status: AC
  Filled 2023-01-30: qty 2

## 2023-01-30 MED ORDER — SODIUM CHLORIDE 0.9% FLUSH
3.0000 mL | Freq: Two times a day (BID) | INTRAVENOUS | Status: DC
  Administered 2023-01-30 – 2023-01-31 (×3): 3 mL via INTRAVENOUS

## 2023-01-30 MED ORDER — ENOXAPARIN SODIUM 60 MG/0.6ML IJ SOSY
50.0000 mg | PREFILLED_SYRINGE | INTRAMUSCULAR | Status: DC
  Administered 2023-01-31 – 2023-02-01 (×2): 50 mg via SUBCUTANEOUS
  Filled 2023-01-30 (×2): qty 0.6

## 2023-01-30 MED ORDER — SACUBITRIL-VALSARTAN 97-103 MG PO TABS
1.0000 | ORAL_TABLET | Freq: Two times a day (BID) | ORAL | Status: DC
  Administered 2023-01-30 – 2023-02-01 (×4): 1 via ORAL
  Filled 2023-01-30 (×4): qty 1

## 2023-01-30 MED ORDER — HEPARIN SODIUM (PORCINE) 1000 UNIT/ML IJ SOLN
INTRAMUSCULAR | Status: AC
  Filled 2023-01-30: qty 10

## 2023-01-30 MED ORDER — POTASSIUM CHLORIDE CRYS ER 20 MEQ PO TBCR: 60.0000 meq | EXTENDED_RELEASE_TABLET | Freq: Once | ORAL | Status: DC

## 2023-01-30 MED ORDER — FENTANYL CITRATE (PF) 100 MCG/2ML IJ SOLN
INTRAMUSCULAR | Status: DC | PRN
  Administered 2023-01-30: 25 ug via INTRAVENOUS

## 2023-01-30 MED ORDER — HEPARIN SODIUM (PORCINE) 1000 UNIT/ML IJ SOLN
INTRAMUSCULAR | Status: DC | PRN
  Administered 2023-01-30: 5000 [IU] via INTRAVENOUS

## 2023-01-30 MED ORDER — ONDANSETRON HCL 4 MG/2ML IJ SOLN: 4.0000 mg | Freq: Four times a day (QID) | INTRAMUSCULAR | Status: DC | PRN

## 2023-01-30 MED ORDER — LIDOCAINE HCL (PF) 1 % IJ SOLN
INTRAMUSCULAR | Status: AC
  Filled 2023-01-30: qty 30

## 2023-01-30 MED ORDER — VERAPAMIL HCL 2.5 MG/ML IV SOLN
INTRAVENOUS | Status: DC | PRN
  Administered 2023-01-30: 10 mL via INTRA_ARTERIAL

## 2023-01-30 MED ORDER — FENTANYL CITRATE (PF) 100 MCG/2ML IJ SOLN
INTRAMUSCULAR | Status: AC
  Filled 2023-01-30: qty 2

## 2023-01-30 MED ORDER — HEPARIN (PORCINE) IN NACL 1000-0.9 UT/500ML-% IV SOLN
INTRAVENOUS | Status: DC | PRN
  Administered 2023-01-30 (×2): 500 mL

## 2023-01-30 MED ORDER — ACETAMINOPHEN 325 MG PO TABS: 650.0000 mg | ORAL_TABLET | ORAL | Status: DC | PRN

## 2023-01-30 MED ORDER — SODIUM CHLORIDE 0.9% FLUSH: 3.0000 mL | INTRAVENOUS | Status: DC | PRN

## 2023-01-30 MED ORDER — SACUBITRIL-VALSARTAN 49-51 MG PO TABS
1.0000 | ORAL_TABLET | Freq: Once | ORAL | Status: AC
  Administered 2023-01-30: 1 via ORAL
  Filled 2023-01-30: qty 1

## 2023-01-30 MED ORDER — SODIUM CHLORIDE 0.9 % IV SOLN: 250.0000 mL | INTRAVENOUS | Status: DC | PRN

## 2023-01-30 MED ORDER — LIDOCAINE HCL (PF) 1 % IJ SOLN
INTRAMUSCULAR | Status: DC | PRN
  Administered 2023-01-30: 5 mL via INTRADERMAL

## 2023-01-30 MED ORDER — IOHEXOL 350 MG/ML SOLN
INTRAVENOUS | Status: DC | PRN
  Administered 2023-01-30: 60 mL

## 2023-01-30 SURGICAL SUPPLY — 11 items
BAND CMPR LRG ZPHR (HEMOSTASIS) ×1
BAND ZEPHYR COMPRESS 30 LONG (HEMOSTASIS) IMPLANT
CATH 5FR JL3.5 JR4 ANG PIG MP (CATHETERS) IMPLANT
CATH INFINITI 5FR JL4 (CATHETERS) IMPLANT
CATH SWAN GANZ 7F STRAIGHT (CATHETERS) IMPLANT
GLIDESHEATH SLEND SS 6F .021 (SHEATH) IMPLANT
GLIDESHEATH SLENDER 7FR .021G (SHEATH) IMPLANT
GUIDEWIRE INQWIRE 1.5J.035X260 (WIRE) IMPLANT
INQWIRE 1.5J .035X260CM (WIRE) ×1
PACK CARDIAC CATHETERIZATION (CUSTOM PROCEDURE TRAY) ×1 IMPLANT
TRANSDUCER W/STOPCOCK (MISCELLANEOUS) ×1 IMPLANT

## 2023-01-30 NOTE — Progress Notes (Signed)
PROGRESS NOTE    Michael Willis  ZOX:[id number] DOB: 03/25/1976 DOA: 01/28/2023 PCP: Wanda Plump, MD  Chief Complaint  Patient presents with   Leg Swelling   Headache    Brief Narrative:   EMERIK BUCHMAN is Michael Willis 47 y.o. male with Hymen Arnett history of diabetes, hypertension. Patient presented secondary to persistent dyspnea on exertion with development of bilateral lower extremity edema. On admission, patient was found to have evidence of 6 severely elevated hypertension patient evidence of heart failure. Advanced heart failure team was consulted on admission with bedside echocardiogram suggesting LVEF of 25%. Patient started on IV diuresis and antihypertensive medications.   Assessment & Plan:   Principal Problem:   Acute systolic heart failure (HCC) Active Problems:   Hypertensive urgency   Acute heart failure (HCC)  Acute systolic heart failure Echo with EF 25-30%, global hypokinesis, severe concentric LVH, indeterminate diastolic filling, mildly reduced RVSF Appreciate cardiology recommendations - suspected hypertensive cardiomyopathy S/p L/RHC today, no CAD -> plan for cardiac MRI Entresto, spironolactone, jardiance.  Beta blocker currently on hold with acute CHF. Needs outpatient sleep study Strict I/O, daily weights   Severe asymptomatic hypertension Adjust BP meds as tolerated/able Continue entresto, spironolactone, jardiance    Diabetes mellitus type 2 Controlled based on hemoglobin A1C of 6.6%.  Jardiance Will ask diabetic coordinator to see   AKI Most recent baseline creatinine of 1.1 from 2020.  AKI versus possible CKD.  Patient with Aloysious Vangieson creatinine of 1.52 on admission and normal BUN.  Urinalysis significant for mild protein and small hemoglobin.  If an AKI, likely related to fluid overload from heart failure.  Patient currently started on diuresis per cardiology. Creatinine slightly bumped up to 1.79. - will continue to trend   Hyperbilirubinemia Mixed.  Possibly rated to  fluid overload and resultant liver congestion.  Patient is asymptomatic.  Associated mildly elevated ALT of 60. -Follow-up bilirubin for improvement over time   Hyperlipidemia LDL of 161 noted on this morning's lipid panel.  Patient started on Lipitor per cardiology -Cardiology recommendations: Lipitor 40 mg daily   Abnormal x-ray Concern for possible pneumonia versus atelectasis. Patient's history not consistent with pneumonia. There may be Milayah Krell component of edema. -Incentive spirometer -should have repeat CXR outpatient (or sooner if needed)   Obesity Body mass index is 34.86 kg/m.    DVT prophylaxis: lovenox Code Status: full Family Communication: father at bedside Disposition:   Status is: Inpatient Remains inpatient appropriate because: pending cardiology clearance for discharge   Consultants:  cardiology  Procedures:  R/LHC   The left ventricular ejection fraction is 25-35% by visual estimate.   Findings:   Ao = 135/92 (110) LV = 131/14 RA = 3 RV = 30/8 PA = 28/11 (17) PCW = 5 Fick cardiac output/index = 5.5/2.3 PVR = 2.2 WU FA sat = 93$ PA sat = 66%, 70%   Assessment: 1. NICM EF 30% 2. No CAD 3. Well-compensated filling pressures   Plan/Discussion:    Suspect HTN CM. Titrate GDMT. Plan cMRI.   Echo IMPRESSIONS     1. Left ventricular ejection fraction, by estimation, is 25 to 30%. The  left ventricle has severely decreased function. The left ventricle  demonstrates global hypokinesis. There is severe concentric left  ventricular hypertrophy. Indeterminate diastolic  filling due to E-Khaniyah Bezek fusion.   2. Right ventricular systolic function is mildly reduced. The right  ventricular size is moderately enlarged. Tricuspid regurgitation signal is  inadequate for assessing PA pressure.   3.  The mitral valve is grossly normal. Trivial mitral valve  regurgitation. No evidence of mitral stenosis.   4. The aortic valve is tricuspid. Aortic valve regurgitation  is not  visualized. No aortic stenosis is present.   5. The inferior vena cava is normal in size with greater than 50%  respiratory variability, suggesting right atrial pressure of 3 mmHg.   Antimicrobials:  Anti-infectives (From admission, onward)    None       Subjective: No new complaints  Objective: Vitals:   01/30/23 1001 01/30/23 1008 01/30/23 1030 01/30/23 1124  BP:  (!) 152/110 (!) 147/98 (!) 151/133  Pulse: (!) 0 97 95 95  Resp:  18  18  Temp:  97.7 F (36.5 C)    TempSrc:  Oral    SpO2:  97% 98% 96%  Weight:      Height:        Intake/Output Summary (Last 24 hours) at 01/30/2023 1440 Last data filed at 01/30/2023 0829 Gross per 24 hour  Intake 0.53 ml  Output 5290 ml  Net -5289.47 ml   Filed Weights   01/28/23 2000 01/29/23 0425 01/30/23 0619  Weight: 120.8 kg 118.9 kg 111.8 kg    Examination:  General exam: Appears calm and comfortable  Respiratory system: Clear to auscultation. Respiratory effort normal. Cardiovascular system: S1 & S2 heard, RRR.  Gastrointestinal system: Abdomen is nondistended, soft and nontender. Central nervous system: Alert and oriented. No focal neurological deficits. Extremities: trace edema    Data Reviewed: I have personally reviewed following labs and imaging studies  CBC: Recent Labs  Lab 01/28/23 0952 01/29/23 0036 01/30/23 0008  WBC 11.2* 14.0* 15.6*  NEUTROABS 7.2  --   --   HGB 14.1 14.6 17.0  HCT 43.9 43.4 52.0  MCV 89.4 84.1 86.2  PLT 196 220 241    Basic Metabolic Panel: Recent Labs  Lab 01/28/23 0952 01/29/23 0036 01/30/23 0008  NA 137 139 140  K 4.3 4.5 3.6  CL 102 99 94*  CO2 25 28 33*  GLUCOSE 167* 203* 160*  BUN 19 22* 24*  CREATININE 1.52* 1.65* 1.79*  CALCIUM 9.2 9.4 9.9    GFR: Estimated Creatinine Clearance: 65.1 mL/min (Shametra Cumberland) (by C-G formula based on SCr of 1.79 mg/dL (H)).  Liver Function Tests: Recent Labs  Lab 01/28/23 0952 01/29/23 0036  AST 32 37  ALT 58* 60*   ALKPHOS 39 37*  BILITOT 1.5* 1.5*  PROT 6.3* 6.4*  ALBUMIN 3.4* 3.5    CBG: Recent Labs  Lab 01/29/23 1219 01/29/23 1642 01/29/23 2056 01/30/23 0617 01/30/23 1123  GLUCAP 198* 216* 200* 160* 144*     No results found for this or any previous visit (from the past 240 hour(s)).       Radiology Studies: CARDIAC CATHETERIZATION  Result Date: 01/30/2023   The left ventricular ejection fraction is 25-35% by visual estimate. Findings: Ao = 135/92 (110) LV = 131/14 RA = 3 RV = 30/8 PA = 28/11 (17) PCW = 5 Fick cardiac output/index = 5.5/2.3 PVR = 2.2 WU FA sat = 93$ PA sat = 66%, 70% Assessment: 1. NICM EF 30% 2. No CAD 3. Well-compensated filling pressures Plan/Discussion: Suspect HTN CM. Titrate GDMT. Plan cMRI. Arvilla Meres, MD 10:53 AM  ECHOCARDIOGRAM COMPLETE  Result Date: 01/29/2023    ECHOCARDIOGRAM REPORT   Patient Name:   SANTANA EDELL Geralds Date of Exam: 01/29/2023 Medical Rec #:  [id number]      Height:  70.5 in Accession #:    [id number]     Weight:       262.2 lb Date of Birth:  [birth year]      BSA:          2.354 m Patient Age:    47 years       BP:           168/99 mmHg Patient Gender: M              HR:           85 bpm. Exam Location:  Inpatient Procedure: 2D Echo, Color Doppler, Cardiac Doppler and Intracardiac            Opacification Agent Indications:    CHF  History:        Patient has no prior history of Echocardiogram examinations.                 CHF; Risk Factors:Hypertension, Sleep Apnea, Dyslipidemia and                 Diabetes.  Sonographer:    Milbert Coulter Referring Phys: [phone] RALPH Arlen Dupuis NETTEY IMPRESSIONS  1. Left ventricular ejection fraction, by estimation, is 25 to 30%. The left ventricle has severely decreased function. The left ventricle demonstrates global hypokinesis. There is severe concentric left ventricular hypertrophy. Indeterminate diastolic filling due to E-Jantz Main fusion.  2. Right ventricular systolic function is mildly reduced. The right ventricular  size is moderately enlarged. Tricuspid regurgitation signal is inadequate for assessing PA pressure.  3. The mitral valve is grossly normal. Trivial mitral valve regurgitation. No evidence of mitral stenosis.  4. The aortic valve is tricuspid. Aortic valve regurgitation is not visualized. No aortic stenosis is present.  5. The inferior vena cava is normal in size with greater than 50% respiratory variability, suggesting right atrial pressure of 3 mmHg. FINDINGS  Left Ventricle: Left ventricular ejection fraction, by estimation, is 25 to 30%. The left ventricle has severely decreased function. The left ventricle demonstrates global hypokinesis. Definity contrast agent was given IV to delineate the left ventricular endocardial borders. The left ventricular internal cavity size was normal in size. There is severe concentric left ventricular hypertrophy. Indeterminate diastolic filling due to E-Mikyah Alamo fusion. Right Ventricle: The right ventricular size is moderately enlarged. No increase in right ventricular wall thickness. Right ventricular systolic function is mildly reduced. Tricuspid regurgitation signal is inadequate for assessing PA pressure. Left Atrium: Left atrial size was normal in size. Right Atrium: Right atrial size was normal in size. Pericardium: Trivial pericardial effusion is present. Mitral Valve: The mitral valve is grossly normal. Trivial mitral valve regurgitation. No evidence of mitral valve stenosis. Tricuspid Valve: The tricuspid valve is grossly normal. Tricuspid valve regurgitation is trivial. No evidence of tricuspid stenosis. Aortic Valve: The aortic valve is tricuspid. Aortic valve regurgitation is not visualized. No aortic stenosis is present. Aortic valve mean gradient measures 3.0 mmHg. Aortic valve peak gradient measures 5.8 mmHg. Aortic valve area, by VTI measures 3.19 cm. Pulmonic Valve: The pulmonic valve was grossly normal. Pulmonic valve regurgitation is trivial. No evidence of pulmonic  stenosis. Aorta: The aortic root and ascending aorta are structurally normal, with no evidence of dilitation. Venous: The inferior vena cava is normal in size with greater than 50% respiratory variability, suggesting right atrial pressure of 3 mmHg. IAS/Shunts: The atrial septum is grossly normal.  LEFT VENTRICLE PLAX 2D LVIDd:         5.50 cm  Diastology LVIDs:         5.00 cm   LV e' medial:  6.20 cm/s LV PW:         1.60 cm   LV e' lateral: 8.05 cm/s LV IVS:        1.60 cm LVOT diam:     2.50 cm LV SV:         59 LV SV Index:   25 LVOT Area:     4.91 cm  RIGHT VENTRICLE RV S prime:     12.30 cm/s TAPSE (M-mode): 1.9 cm LEFT ATRIUM             Index        RIGHT ATRIUM           Index LA diam:        4.30 cm 1.83 cm/m   RA Area:     17.00 cm LA Vol (A2C):   57.1 ml 24.25 ml/m  RA Volume:   44.80 ml  19.03 ml/m LA Vol (A4C):   51.9 ml 22.04 ml/m LA Biplane Vol: 55.7 ml 23.66 ml/m  AORTIC VALVE AV Area (Vmax):    3.41 cm AV Area (Vmean):   3.20 cm AV Area (VTI):     3.19 cm AV Vmax:           120.00 cm/s AV Vmean:          82.500 cm/s AV VTI:            0.186 m AV Peak Grad:      5.8 mmHg AV Mean Grad:      3.0 mmHg LVOT Vmax:         83.40 cm/s LVOT Vmean:        53.700 cm/s LVOT VTI:          0.121 m LVOT/AV VTI ratio: 0.65  AORTA Ao Root diam: 3.60 cm Ao Asc diam:  2.90 cm  SHUNTS Systemic VTI:  0.12 m Systemic Diam: 2.50 cm Lennie Odor MD Electronically signed by Lennie Odor MD Signature Date/Time: 01/29/2023/4:06:32 PM    Final         Scheduled Meds:  atorvastatin  80 mg Oral Daily   empagliflozin  10 mg Oral Daily   [START ON 01/31/2023] enoxaparin (LOVENOX) injection  50 mg Subcutaneous Q24H   insulin aspart  0-9 Units Subcutaneous TID WC   potassium chloride  60 mEq Oral Once   sacubitril-valsartan  1 tablet Oral BID   sodium chloride flush  3 mL Intravenous Q12H   sodium chloride flush  3 mL Intravenous Q12H   spironolactone  12.5 mg Oral Daily   Continuous Infusions:  sodium  chloride       LOS: 1 day    Time spent: over 30 min    Lacretia Nicks, MD [facility]   To contact the attending provider between 7A-7P or the covering provider during after hours 7P-7A, please log into the web site [email or web address] and access using universal [place] password for that web site. If you do not have the password, please call the hospital operator.  01/30/2023, 2:40 PM

## 2023-01-30 NOTE — Plan of Care (Signed)
  Problem: Clinical Measurements: Goal: Diagnostic test results will improve Outcome: Progressing Goal: Respiratory complications will improve Outcome: Progressing   

## 2023-01-30 NOTE — TOC Initial Note (Signed)
Transition of Care [facility]) - Initial/Assessment Note    Patient Details  Name: Michael Willis MRN: [id number] Date of Birth: 05/14/1976  Transition of Care [facility] CM/SW Contact:    Elliot Cousin, RN Phone Number:[phone] 01/30/2023, 3:59 PM  Clinical Narrative:    HF TOC CM spoke to pt and states he has scale for daily weights. Pt works for himself, independent at home. Will use [facility] for medications for home. Appt arranged at [facility] for 6/25 at 930 pm. Provided pt with brochure for [facility]. He can use [facility] for discounted meds. Sent referral to Financial Counselor to screen for Medicaid, CAFA and disability.                Expected Discharge Plan: Home/Self Care Barriers to Discharge: Continued Medical Work up   Patient Goals and CMS Choice Patient states their goals for this hospitalization and ongoing recovery are:: wants to remain independent          Expected Discharge Plan and Services   Discharge Planning Services: CM Consult   Living arrangements for the past 2 months: Single Family Home                                      Prior Living Arrangements/Services Living arrangements for the past 2 months: Single Family Home   Patient language and need for interpreter reviewed:: Yes Do you feel safe going back to the place where you live?: Yes      Need for Family Participation in Patient Care: No (Comment) Care giver support system in place?: Yes (comment)   Criminal Activity/Legal Involvement Pertinent to Current Situation/Hospitalization: No - Comment as needed  Activities of Daily Living Home Assistive Devices/Equipment: None ADL Screening (condition at time of admission) Patient's cognitive ability adequate to safely complete daily activities?: Yes Is the patient deaf or have difficulty hearing?: No Does the patient have difficulty seeing, even when wearing glasses/contacts?: No Does the patient have difficulty concentrating,  remembering, or making decisions?: No Patient able to express need for assistance with ADLs?: Yes Does the patient have difficulty dressing or bathing?: No Independently performs ADLs?: Yes (appropriate for developmental age) Does the patient have difficulty walking or climbing stairs?: No Weakness of Legs: None Weakness of Arms/Hands: None  Permission Sought/Granted Permission sought to share information with : Case Manager                Emotional Assessment Appearance:: Appears stated age Attitude/Demeanor/Rapport: Engaged Affect (typically observed): Accepting Orientation: : Oriented to Self, Oriented to Place, Oriented to  Time, Oriented to Situation   Psych Involvement: No (comment)  Admission diagnosis:  Acute heart failure (HCC) [I50.9] Heart failure, unspecified HF chronicity, unspecified heart failure type [facility]) [I50.9] Patient Active Problem List   Diagnosis Date Noted   Acute heart failure (HCC) 01/29/2023   Acute systolic heart failure (HCC) 01/28/2023   Hypertensive urgency 01/28/2023   History of exposure to blood or body fluid 02/05/2020   Diabetes mellitus without complication (HCC) 12/04/2018   Swelling of right eyelid 02/04/2018   Essential hypertension    PCP NOTES >>>>>>>>>>>>>>>>>>>>.Marland Kitchen 12/14/2015   Hyperlipidemia 12/04/2013   Elevated LFTs 01/05/2013   Annual physical exam 12/12/2012   OSA (obstructive sleep apnea) 12/12/2012   HTN (hypertension) 12/12/2012   PCP:  Wanda Plump, MD Pharmacy:   [facility] [address] ([address]), [place] - [phone]  [address] [facility] [facility] [address] [place] ([place]) [place] [address] Phone: [phone] Fax: [phone]     Social Determinants of Health (SDOH) Social History: SDOH Screenings   Food Insecurity: No Food Insecurity (01/28/2023)  Housing: Low Risk  (01/28/2023)  Transportation Needs: No Transportation Needs (01/28/2023)  Utilities: Not At Risk (01/28/2023)  Depression (PHQ2-9): Low Risk  (05/08/2019)   Tobacco Use: Medium Risk (01/28/2023)   SDOH Interventions:     Readmission Risk Interventions     No data to display

## 2023-01-30 NOTE — Plan of Care (Signed)
  Problem: Health Behavior/Discharge Planning: Goal: Ability to manage health-related needs will improve Outcome: Progressing   Problem: Clinical Measurements: Goal: Ability to maintain clinical measurements within normal limits will improve Outcome: Progressing   Problem: Activity: Goal: Risk for activity intolerance will decrease Outcome: Progressing   Problem: Nutrition: Goal: Adequate nutrition will be maintained Outcome: Progressing   

## 2023-01-30 NOTE — H&P (View-Only) (Signed)
  Advanced Heart Failure Rounding Note  PCP-Cardiologist: None   Subjective:    Denies CP or SOB.   BP still high.  Volume improved.Out 9L yesterday!   Scr up slightly to 1.79   For cath today  Objective:   Weight Range: 111.8 kg Body mass index is 34.86 kg/m.   Vital Signs:   Temp:  [98 F (36.7 C)-98.2 F (36.8 C)] 98 F (36.7 C) (06/12 0730) Pulse Rate:  [80-98] 98 (06/12 0730) Resp:  [16-18] 18 (06/12 0730) BP: (145-171)/(99-118) 150/104 (06/12 0730) SpO2:  [95 %-98 %] 96 % (06/12 0847) Weight:  [111.8 kg] 111.8 kg (06/12 0619) Last BM Date : 01/28/23  Weight change: Filed Weights   01/28/23 2000 01/29/23 0425 01/30/23 0619  Weight: 120.8 kg 118.9 kg 111.8 kg    Intake/Output:   Intake/Output Summary (Last 24 hours) at 01/30/2023 0901 Last data filed at 01/30/2023 0829 Gross per 24 hour  Intake 100.53 ml  Output 9315 ml  Net -9214.47 ml       Physical Exam   General:  Well appearing. No resp difficulty HEENT: normal Neck: supple. no JVD. Carotids 2+ bilat; no bruits. No lymphadenopathy or thryomegaly appreciated. Cor: PMI nondisplaced. Regular rate & rhythm. No rubs, gallops or murmurs. Lungs: clear Abdomen: obese soft, nontender, nondistended. No hepatosplenomegaly. No bruits or masses. Good bowel sounds. Extremities: no cyanosis, clubbing, rash, edema Neuro: alert & orientedx3, cranial nerves grossly intact. moves all 4 extremities w/o difficulty. Affect pleasant   Telemetry   Sinus 90s Personally reviewed   Labs    CBC Recent Labs    01/28/23 0952 01/29/23 0036 01/30/23 0008  WBC 11.2* 14.0* 15.6*  NEUTROABS 7.2  --   --   HGB 14.1 14.6 17.0  HCT 43.9 43.4 52.0  MCV 89.4 84.1 86.2  PLT 196 220 241    Basic Metabolic Panel Recent Labs    01/29/23 0036 01/30/23 0008  NA 139 140  K 4.5 3.6  CL 99 94*  CO2 28 33*  GLUCOSE 203* 160*  BUN 22* 24*  CREATININE 1.65* 1.79*  CALCIUM 9.4 9.9    Liver Function  Tests Recent Labs    01/28/23 0952 01/29/23 0036  AST 32 37  ALT 58* 60*  ALKPHOS 39 37*  BILITOT 1.5* 1.5*  PROT 6.3* 6.4*  ALBUMIN 3.4* 3.5    No results for input(s): "LIPASE", "AMYLASE" in the last 72 hours. Cardiac Enzymes No results for input(s): "CKTOTAL", "CKMB", "CKMBINDEX", "TROPONINI" in the last 72 hours.  BNP: BNP (last 3 results) Recent Labs    01/28/23 0952  BNP 1,566.1*     ProBNP (last 3 results) No results for input(s): "PROBNP" in the last 8760 hours.   D-Dimer No results for input(s): "DDIMER" in the last 72 hours. Hemoglobin A1C Recent Labs    01/29/23 0036  HGBA1C 6.6*    Fasting Lipid Panel Recent Labs    01/29/23 0036  CHOL 226*  HDL 46  LDLCALC 161*  TRIG 93  CHOLHDL 4.9    Thyroid Function Tests Recent Labs    01/29/23 0036  TSH 0.496     Other results:   Imaging    ECHOCARDIOGRAM COMPLETE  Result Date: 01/29/2023    ECHOCARDIOGRAM REPORT   Patient Name:   Michael Willis Date of Exam: 01/29/2023 Medical Rec #:  [id number]      Height:       70.5 in Accession #:    [id number]       Weight:       262.2 lb Date of Birth:  03/07/1976      BSA:          2.354 m Patient Age:    47 years       BP:           168/99 mmHg Patient Gender: M              HR:           85 bpm. Exam Location:  Inpatient Procedure: 2D Echo, Color Doppler, Cardiac Doppler and Intracardiac            Opacification Agent Indications:    CHF  History:        Patient has no prior history of Echocardiogram examinations.                 CHF; Risk Factors:Hypertension, Sleep Apnea, Dyslipidemia and                 Diabetes.  Sonographer:    Melissa Kafa Referring Phys: [id number] RALPH A NETTEY IMPRESSIONS  1. Left ventricular ejection fraction, by estimation, is 25 to 30%. The left ventricle has severely decreased function. The left ventricle demonstrates global hypokinesis. There is severe concentric left ventricular hypertrophy. Indeterminate diastolic filling due to E-A  fusion.  2. Right ventricular systolic function is mildly reduced. The right ventricular size is moderately enlarged. Tricuspid regurgitation signal is inadequate for assessing PA pressure.  3. The mitral valve is grossly normal. Trivial mitral valve regurgitation. No evidence of mitral stenosis.  4. The aortic valve is tricuspid. Aortic valve regurgitation is not visualized. No aortic stenosis is present.  5. The inferior vena cava is normal in size with greater than 50% respiratory variability, suggesting right atrial pressure of 3 mmHg. FINDINGS  Left Ventricle: Left ventricular ejection fraction, by estimation, is 25 to 30%. The left ventricle has severely decreased function. The left ventricle demonstrates global hypokinesis. Definity contrast agent was given IV to delineate the left ventricular endocardial borders. The left ventricular internal cavity size was normal in size. There is severe concentric left ventricular hypertrophy. Indeterminate diastolic filling due to E-A fusion. Right Ventricle: The right ventricular size is moderately enlarged. No increase in right ventricular wall thickness. Right ventricular systolic function is mildly reduced. Tricuspid regurgitation signal is inadequate for assessing PA pressure. Left Atrium: Left atrial size was normal in size. Right Atrium: Right atrial size was normal in size. Pericardium: Trivial pericardial effusion is present. Mitral Valve: The mitral valve is grossly normal. Trivial mitral valve regurgitation. No evidence of mitral valve stenosis. Tricuspid Valve: The tricuspid valve is grossly normal. Tricuspid valve regurgitation is trivial. No evidence of tricuspid stenosis. Aortic Valve: The aortic valve is tricuspid. Aortic valve regurgitation is not visualized. No aortic stenosis is present. Aortic valve mean gradient measures 3.0 mmHg. Aortic valve peak gradient measures 5.8 mmHg. Aortic valve area, by VTI measures 3.19 cm. Pulmonic Valve: The pulmonic  valve was grossly normal. Pulmonic valve regurgitation is trivial. No evidence of pulmonic stenosis. Aorta: The aortic root and ascending aorta are structurally normal, with no evidence of dilitation. Venous: The inferior vena cava is normal in size with greater than 50% respiratory variability, suggesting right atrial pressure of 3 mmHg. IAS/Shunts: The atrial septum is grossly normal.  LEFT VENTRICLE PLAX 2D LVIDd:         5.50 cm   Diastology LVIDs:         5.00 cm     LV e' medial:  6.20 cm/s LV PW:         1.60 cm   LV e' lateral: 8.05 cm/s LV IVS:        1.60 cm LVOT diam:     2.50 cm LV SV:         59 LV SV Index:   25 LVOT Area:     4.91 cm  RIGHT VENTRICLE RV S prime:     12.30 cm/s TAPSE (M-mode): 1.9 cm LEFT ATRIUM             Index        RIGHT ATRIUM           Index LA diam:        4.30 cm 1.83 cm/m   RA Area:     17.00 cm LA Vol (A2C):   57.1 ml 24.25 ml/m  RA Volume:   44.80 ml  19.03 ml/m LA Vol (A4C):   51.9 ml 22.04 ml/m LA Biplane Vol: 55.7 ml 23.66 ml/m  AORTIC VALVE AV Area (Vmax):    3.41 cm AV Area (Vmean):   3.20 cm AV Area (VTI):     3.19 cm AV Vmax:           120.00 cm/s AV Vmean:          82.500 cm/s AV VTI:            0.186 m AV Peak Grad:      5.8 mmHg AV Mean Grad:      3.0 mmHg LVOT Vmax:         83.40 cm/s LVOT Vmean:        53.700 cm/s LVOT VTI:          0.121 m LVOT/AV VTI ratio: 0.65  AORTA Ao Root diam: 3.60 cm Ao Asc diam:  2.90 cm  SHUNTS Systemic VTI:  0.12 m Systemic Diam: 2.50 cm Clementon O'Neal MD Electronically signed by Newman O'Neal MD Signature Date/Time: 01/29/2023/4:06:32 PM    Final      Medications:     Scheduled Medications:  [MAR Hold] atorvastatin  80 mg Oral Daily   [MAR Hold] empagliflozin  10 mg Oral Daily   [MAR Hold] enoxaparin (LOVENOX) injection  40 mg Subcutaneous Q24H   [MAR Hold] insulin aspart  0-9 Units Subcutaneous TID WC   [MAR Hold] potassium chloride  60 mEq Oral Once   [MAR Hold] sacubitril-valsartan  1 tablet Oral BID   [MAR  Hold] sodium chloride flush  3 mL Intravenous Q12H   [MAR Hold] spironolactone  12.5 mg Oral Daily    Infusions:  sodium chloride     sodium chloride 10 mL/hr at 01/30/23 0648    PRN Medications: sodium chloride, [MAR Hold] acetaminophen **OR** [MAR Hold] acetaminophen, Heparin (Porcine) in NaCl, [MAR Hold] hydrALAZINE, [MAR Hold] ondansetron **OR** [MAR Hold] ondansetron (ZOFRAN) IV, sodium chloride flush    Patient Profile   Michael Willis is a 47 y.o.  male with history of DM II, HTN, hyperlipidemia, obesity. AHF team to see for acute CHF.   Assessment/Plan  1. Acute systolic heart failure - May be d/t uncontrolled HTN. Has risk factors for CAD. Will need ischemic workup once diuresed. - Bedside echo done by Dr. Harrold Fitchett 6/10 with EF ~25% and moderate to severe RV dysfunction.  - formal echo pending today - Lactic acid 1.6 - NYHA III. Volume still slightly elevated, continue IV lasix 60 BID - Increase Entresto to 97/103 mg   BID. - Continue 12.5 mg spiro daily - Continue Jardiance 10 mg daily, Hgb A1c 6.6 - R/L cath today. Procedure explained - Hold off on beta blocker for now with acute CHF  - strict I&O, daily weights   2. Uncontrolled HTN -PRN IV hydralazine for SBP > 160 -GDMT as above - BP still high. Increase Entesto Goal SBP 130-140   3. DM II -Has been off meds for at least a few years -A1c 6.6   4. Obesity  -Body mass index is 34.86 kg/m.  -Sleep study as outpatient -Would benefit from GLP-1 agonist to assist with weight loss   5. AKI - unknown baseline - likely HTN and cardiorenal - Scr 1.65 -> 1.79, 1.1 several years ago - expected rise in SCr with GDMT - Follow with diuresis  6. Hyperlipidemia - LDL 161 - statin started   SDOH: Uninsured. Consult HF TOC CM. Screen for medicaid. Will need assistance with medications.    Length of Stay: 1  Hyden Soley, MD  01/30/2023, 9:01 AM  Advanced Heart Failure Team Pager [phone] (M-F; 7a - 5p)   Please contact [facility] for night-coverage after hours (5p -7a ) and weekends on [email or web address]    

## 2023-01-30 NOTE — Interval H&P Note (Signed)
History and Physical Interval Note:  01/30/2023 9:05 AM  Roye A Mccravy  has presented today for surgery, with the diagnosis of heart failure.  The various methods of treatment have been discussed with the patient and family. After consideration of risks, benefits and other options for treatment, the patient has consented to  Procedure(s): RIGHT/LEFT HEART CATH AND CORONARY ANGIOGRAPHY (N/A) as a surgical intervention.  The patient's history has been reviewed, patient examined, no change in status, stable for surgery.  I have reviewed the patient's chart and labs.  Questions were answered to the patient's satisfaction.     Juliannah Ohmann

## 2023-01-30 NOTE — Progress Notes (Signed)
Advanced Heart Failure Rounding Note  PCP-Cardiologist: None   Subjective:    Denies CP or SOB.   BP still high.  Volume improved.Out 9L yesterday!   Scr up slightly to 1.79   For cath today  Objective:   Weight Range: 111.8 kg Body mass index is 34.86 kg/m.   Vital Signs:   Temp:  [98 F (36.7 C)-98.2 F (36.8 C)] 98 F (36.7 C) (06/12 0730) Pulse Rate:  [80-98] 98 (06/12 0730) Resp:  [16-18] 18 (06/12 0730) BP: (145-171)/(99-118) 150/104 (06/12 0730) SpO2:  [95 %-98 %] 96 % (06/12 0847) Weight:  [111.8 kg] 111.8 kg (06/12 0619) Last BM Date : 01/28/23  Weight change: Filed Weights   01/28/23 2000 01/29/23 0425 01/30/23 0619  Weight: 120.8 kg 118.9 kg 111.8 kg    Intake/Output:   Intake/Output Summary (Last 24 hours) at 01/30/2023 0901 Last data filed at 01/30/2023 0829 Gross per 24 hour  Intake 100.53 ml  Output 9315 ml  Net -9214.47 ml       Physical Exam   General:  Well appearing. No resp difficulty HEENT: normal Neck: supple. no JVD. Carotids 2+ bilat; no bruits. No lymphadenopathy or thryomegaly appreciated. Cor: PMI nondisplaced. Regular rate & rhythm. No rubs, gallops or murmurs. Lungs: clear Abdomen: obese soft, nontender, nondistended. No hepatosplenomegaly. No bruits or masses. Good bowel sounds. Extremities: no cyanosis, clubbing, rash, edema Neuro: alert & orientedx3, cranial nerves grossly intact. moves all 4 extremities w/o difficulty. Affect pleasant   Telemetry   Sinus 90s Personally reviewed   Labs    CBC Recent Labs    01/28/23 0952 01/29/23 0036 01/30/23 0008  WBC 11.2* 14.0* 15.6*  NEUTROABS 7.2  --   --   HGB 14.1 14.6 17.0  HCT 43.9 43.4 52.0  MCV 89.4 84.1 86.2  PLT 196 220 241    Basic Metabolic Panel Recent Labs    16/10/96 0036 01/30/23 0008  NA 139 140  K 4.5 3.6  CL 99 94*  CO2 28 33*  GLUCOSE 203* 160*  BUN 22* 24*  CREATININE 1.65* 1.79*  CALCIUM 9.4 9.9    Liver Function  Tests Recent Labs    01/28/23 0952 01/29/23 0036  AST 32 37  ALT 58* 60*  ALKPHOS 39 37*  BILITOT 1.5* 1.5*  PROT 6.3* 6.4*  ALBUMIN 3.4* 3.5    No results for input(s): "LIPASE", "AMYLASE" in the last 72 hours. Cardiac Enzymes No results for input(s): "CKTOTAL", "CKMB", "CKMBINDEX", "TROPONINI" in the last 72 hours.  BNP: BNP (last 3 results) Recent Labs    01/28/23 0952  BNP 1,566.1*     ProBNP (last 3 results) No results for input(s): "PROBNP" in the last 8760 hours.   D-Dimer No results for input(s): "DDIMER" in the last 72 hours. Hemoglobin A1C Recent Labs    01/29/23 0036  HGBA1C 6.6*    Fasting Lipid Panel Recent Labs    01/29/23 0036  CHOL 226*  HDL 46  LDLCALC 161*  TRIG 93  CHOLHDL 4.9    Thyroid Function Tests Recent Labs    01/29/23 0036  TSH 0.496     Other results:   Imaging    ECHOCARDIOGRAM COMPLETE  Result Date: 01/29/2023    ECHOCARDIOGRAM REPORT   Patient Name:   Michael Willis Date of Exam: 01/29/2023 Medical Rec #:  [id number]      Height:       70.5 in Accession #:    [id number]  Weight:       262.2 lb Date of Birth:  [birth year]      BSA:          2.354 m Patient Age:    47 years       BP:           168/99 mmHg Patient Gender: M              HR:           85 bpm. Exam Location:  Inpatient Procedure: 2D Echo, Color Doppler, Cardiac Doppler and Intracardiac            Opacification Agent Indications:    CHF  History:        Patient has no prior history of Echocardiogram examinations.                 CHF; Risk Factors:Hypertension, Sleep Apnea, Dyslipidemia and                 Diabetes.  Sonographer:    Milbert Coulter Referring Phys: [phone] RALPH A NETTEY IMPRESSIONS  1. Left ventricular ejection fraction, by estimation, is 25 to 30%. The left ventricle has severely decreased function. The left ventricle demonstrates global hypokinesis. There is severe concentric left ventricular hypertrophy. Indeterminate diastolic filling due to E-A  fusion.  2. Right ventricular systolic function is mildly reduced. The right ventricular size is moderately enlarged. Tricuspid regurgitation signal is inadequate for assessing PA pressure.  3. The mitral valve is grossly normal. Trivial mitral valve regurgitation. No evidence of mitral stenosis.  4. The aortic valve is tricuspid. Aortic valve regurgitation is not visualized. No aortic stenosis is present.  5. The inferior vena cava is normal in size with greater than 50% respiratory variability, suggesting right atrial pressure of 3 mmHg. FINDINGS  Left Ventricle: Left ventricular ejection fraction, by estimation, is 25 to 30%. The left ventricle has severely decreased function. The left ventricle demonstrates global hypokinesis. Definity contrast agent was given IV to delineate the left ventricular endocardial borders. The left ventricular internal cavity size was normal in size. There is severe concentric left ventricular hypertrophy. Indeterminate diastolic filling due to E-A fusion. Right Ventricle: The right ventricular size is moderately enlarged. No increase in right ventricular wall thickness. Right ventricular systolic function is mildly reduced. Tricuspid regurgitation signal is inadequate for assessing PA pressure. Left Atrium: Left atrial size was normal in size. Right Atrium: Right atrial size was normal in size. Pericardium: Trivial pericardial effusion is present. Mitral Valve: The mitral valve is grossly normal. Trivial mitral valve regurgitation. No evidence of mitral valve stenosis. Tricuspid Valve: The tricuspid valve is grossly normal. Tricuspid valve regurgitation is trivial. No evidence of tricuspid stenosis. Aortic Valve: The aortic valve is tricuspid. Aortic valve regurgitation is not visualized. No aortic stenosis is present. Aortic valve mean gradient measures 3.0 mmHg. Aortic valve peak gradient measures 5.8 mmHg. Aortic valve area, by VTI measures 3.19 cm. Pulmonic Valve: The pulmonic  valve was grossly normal. Pulmonic valve regurgitation is trivial. No evidence of pulmonic stenosis. Aorta: The aortic root and ascending aorta are structurally normal, with no evidence of dilitation. Venous: The inferior vena cava is normal in size with greater than 50% respiratory variability, suggesting right atrial pressure of 3 mmHg. IAS/Shunts: The atrial septum is grossly normal.  LEFT VENTRICLE PLAX 2D LVIDd:         5.50 cm   Diastology LVIDs:         5.00 cm  LV e' medial:  6.20 cm/s LV PW:         1.60 cm   LV e' lateral: 8.05 cm/s LV IVS:        1.60 cm LVOT diam:     2.50 cm LV SV:         59 LV SV Index:   25 LVOT Area:     4.91 cm  RIGHT VENTRICLE RV S prime:     12.30 cm/s TAPSE (M-mode): 1.9 cm LEFT ATRIUM             Index        RIGHT ATRIUM           Index LA diam:        4.30 cm 1.83 cm/m   RA Area:     17.00 cm LA Vol (A2C):   57.1 ml 24.25 ml/m  RA Volume:   44.80 ml  19.03 ml/m LA Vol (A4C):   51.9 ml 22.04 ml/m LA Biplane Vol: 55.7 ml 23.66 ml/m  AORTIC VALVE AV Area (Vmax):    3.41 cm AV Area (Vmean):   3.20 cm AV Area (VTI):     3.19 cm AV Vmax:           120.00 cm/s AV Vmean:          82.500 cm/s AV VTI:            0.186 m AV Peak Grad:      5.8 mmHg AV Mean Grad:      3.0 mmHg LVOT Vmax:         83.40 cm/s LVOT Vmean:        53.700 cm/s LVOT VTI:          0.121 m LVOT/AV VTI ratio: 0.65  AORTA Ao Root diam: 3.60 cm Ao Asc diam:  2.90 cm  SHUNTS Systemic VTI:  0.12 m Systemic Diam: 2.50 cm Lennie Odor MD Electronically signed by Lennie Odor MD Signature Date/Time: 01/29/2023/4:06:32 PM    Final      Medications:     Scheduled Medications:  [MAR Hold] atorvastatin  80 mg Oral Daily   [MAR Hold] empagliflozin  10 mg Oral Daily   [MAR Hold] enoxaparin (LOVENOX) injection  40 mg Subcutaneous Q24H   [MAR Hold] insulin aspart  0-9 Units Subcutaneous TID WC   [MAR Hold] potassium chloride  60 mEq Oral Once   [MAR Hold] sacubitril-valsartan  1 tablet Oral BID   [MAR  Hold] sodium chloride flush  3 mL Intravenous Q12H   [MAR Hold] spironolactone  12.5 mg Oral Daily    Infusions:  sodium chloride     sodium chloride 10 mL/hr at 01/30/23 0648    PRN Medications: sodium chloride, [MAR Hold] acetaminophen **OR** [MAR Hold] acetaminophen, Heparin (Porcine) in NaCl, [MAR Hold] hydrALAZINE, [MAR Hold] ondansetron **OR** [MAR Hold] ondansetron (ZOFRAN) IV, sodium chloride flush    Patient Profile   Michael Willis is a 47 y.o.  male with history of DM II, HTN, hyperlipidemia, obesity. AHF team to see for acute CHF.   Assessment/Plan  1. Acute systolic heart failure - May be d/t uncontrolled HTN. Has risk factors for CAD. Will need ischemic workup once diuresed. - Bedside echo done by Dr. Gala Romney 6/10 with EF ~25% and moderate to severe RV dysfunction.  - formal echo pending today - Lactic acid 1.6 - NYHA III. Volume still slightly elevated, continue IV lasix 60 BID - Increase Entresto to 97/103 mg  BID. - Continue 12.5 mg spiro daily - Continue Jardiance 10 mg daily, Hgb A1c 6.6 - R/L cath today. Procedure explained - Hold off on beta blocker for now with acute CHF  - strict I&O, daily weights   2. Uncontrolled HTN -PRN IV hydralazine for SBP > 160 -GDMT as above - BP still high. Increase Entesto Goal SBP 130-140   3. DM II -Has been off meds for at least a few years -A1c 6.6   4. Obesity  -Body mass index is 34.86 kg/m.  -Sleep study as outpatient -Would benefit from GLP-1 agonist to assist with weight loss   5. AKI - unknown baseline - likely HTN and cardiorenal - Scr 1.65 -> 1.79, 1.1 several years ago - expected rise in SCr with GDMT - Follow with diuresis  6. Hyperlipidemia - LDL 161 - statin started   SDOH: Uninsured. Consult HF TOC CM. Screen for medicaid. Will need assistance with medications.    Length of Stay: 1  Arvilla Meres, MD  01/30/2023, 9:01 AM  Advanced Heart Failure Team Pager [phone] (M-F; 7a - 5p)   Please contact [facility] for night-coverage after hours (5p -7a ) and weekends on [email or web address]

## 2023-01-30 NOTE — Inpatient Diabetes Management (Addendum)
Inpatient Diabetes Program Recommendations  AACE/ADA: New Consensus Statement on Inpatient Glycemic Control (2015)  Target Ranges:  Prepandial:   less than 140 mg/dL      Peak postprandial:   less than 180 mg/dL (1-2 hours)      Critically ill patients:  140 - 180 mg/dL   Lab Results  Component Value Date   GLUCAP 144 (H) 01/30/2023   HGBA1C 6.6 (H) 01/29/2023    Review of Glycemic Control  Diabetes history: DM2 Outpatient Diabetes medications: None Current orders for Inpatient glycemic control: Jardiance 10 mg qd, Novolog 0-9 units tid, 0-5 units hs  Inpatient Diabetes Program Recommendations:   Spoke with patient and wife @ bedside. Patient has not been on diabetes medications since 4 years ago and does not have a PCP and no insurance. Consult placed for transition of care-needs PCP and assistance with medications.  Discussed basic nutrition with patient. Patient states he eats limited amount of carbohydrates and limits sugary drinks and carbohydrates.  Thank you, Billy Fischer. Jeraldean Wechter, RN, MSN, CDE  Diabetes Coordinator Inpatient Glycemic Control Team Team Pager [phone] (8am-5pm) 01/30/2023 3:17 PM

## 2023-01-30 NOTE — Progress Notes (Signed)
RN removed TR band off R radial site. Site is covered with gauze and tegaderm. No signs of hematoma. Patient complains 0/10 pain at the radial site.

## 2023-01-31 ENCOUNTER — Encounter (HOSPITAL_COMMUNITY): Payer: Self-pay | Admitting: Internal Medicine

## 2023-01-31 ENCOUNTER — Inpatient Hospital Stay (HOSPITAL_COMMUNITY): Payer: Medicaid Other

## 2023-01-31 DIAGNOSIS — I3139 Other pericardial effusion (noninflammatory): Secondary | ICD-10-CM

## 2023-01-31 LAB — BASIC METABOLIC PANEL
Anion gap: 10 (ref 5–15)
BUN: 23 mg/dL — ABNORMAL HIGH (ref 6–20)
CO2: 29 mmol/L (ref 22–32)
Calcium: 9 mg/dL (ref 8.9–10.3)
Chloride: 98 mmol/L (ref 98–111)
Creatinine, Ser: 1.55 mg/dL — ABNORMAL HIGH (ref 0.61–1.24)
GFR, Estimated: 56 mL/min — ABNORMAL LOW (ref >60)
Glucose, Bld: 205 mg/dL — ABNORMAL HIGH (ref 70–99)
Potassium: 3.9 mmol/L (ref 3.5–5.1)
Sodium: 137 mmol/L (ref 135–145)

## 2023-01-31 LAB — CBC
HCT: 44.7 % (ref 39.0–52.0)
Hemoglobin: 14.7 g/dL (ref 13.0–17.0)
MCH: 28.1 pg (ref 26.0–34.0)
MCHC: 32.9 g/dL (ref 30.0–36.0)
MCV: 85.5 fL (ref 80.0–100.0)
Platelets: 226 10*3/uL (ref 150–400)
RBC: 5.23 MIL/uL (ref 4.22–5.81)
RDW: 12.1 % (ref 11.5–15.5)
WBC: 14 10*3/uL — ABNORMAL HIGH (ref 4.0–10.5)
nRBC: 0 % (ref 0.0–0.2)

## 2023-01-31 LAB — GLUCOSE, CAPILLARY
Glucose-Capillary: 186 mg/dL — ABNORMAL HIGH (ref 70–99)
Glucose-Capillary: 143 mg/dL — ABNORMAL HIGH (ref 70–99)
Glucose-Capillary: 128 mg/dL — ABNORMAL HIGH (ref 70–99)
Glucose-Capillary: 192 mg/dL — ABNORMAL HIGH (ref 70–99)

## 2023-01-31 MED ORDER — GADOBUTROL 1 MMOL/ML IV SOLN
10.0000 mL | Freq: Once | INTRAVENOUS | Status: AC | PRN
  Administered 2023-01-31: 10 mL via INTRAVENOUS

## 2023-01-31 MED ORDER — SPIRONOLACTONE 25 MG PO TABS
25.0000 mg | ORAL_TABLET | Freq: Every day | ORAL | Status: DC
  Administered 2023-01-31: 25 mg via ORAL
  Filled 2023-01-31: qty 1

## 2023-01-31 NOTE — Plan of Care (Signed)
  Problem: Clinical Measurements: Goal: Diagnostic test results will improve Outcome: Progressing   Problem: Activity: Goal: Risk for activity intolerance will decrease Outcome: Progressing   Problem: Coping: Goal: Level of anxiety will decrease Outcome: Progressing   

## 2023-01-31 NOTE — Progress Notes (Signed)
PROGRESS NOTE    TIMO DRUST  UEA:[id number] DOB: 03-Feb-1976 DOA: 01/28/2023 PCP: Wanda Plump, MD  Chief Complaint  Patient presents with   Leg Swelling   Headache    Brief Narrative:   Michael Willis is Najae Filsaime 47 y.o. male with Hermilo Dutter history of diabetes, hypertension. Patient presented secondary to persistent dyspnea on exertion with development of bilateral lower extremity edema. On admission, patient was found to have evidence of 6 severely elevated hypertension patient evidence of heart failure. Advanced heart failure team was consulted on admission with bedside echocardiogram suggesting LVEF of 25%. Patient started on IV diuresis and antihypertensive medications.   Assessment & Plan:   Principal Problem:   Acute systolic heart failure (HCC) Active Problems:   Hypertensive urgency   Acute heart failure (HCC)  Acute systolic heart failure Echo with EF 25-30%, global hypokinesis, severe concentric LVH, indeterminate diastolic filling, mildly reduced RVSF Appreciate cardiology recommendations - suspected hypertensive cardiomyopathy S/p L/RHC today, no CAD  Cardiac MRI with moderate LVH with severe LVE and global hypokinesis (LVEF 12%), diffuse gadolinium uptake mostly mid myocardial with elevated T1 and ECV consistent with amyloidosis or infiltrative cardiomyopathy (see report) PYP scan 6/14, myeloma panel  Entresto, spironolactone, jardiance.  Beta blocker currently on hold with acute CHF. Needs outpatient sleep study Strict I/O, daily weights   Severe asymptomatic hypertension Adjust BP meds as tolerated/able Continue entresto, spironolactone, jardiance    Diabetes mellitus type 2 Controlled based on hemoglobin A1C of 6.6%.  Jardiance Will ask diabetic coordinator to see   AKI Most recent baseline creatinine of 1.1 from 2020.  AKI versus possible CKD.  Patient with Cambre Matson creatinine of 1.52 on admission and normal BUN.  Urinalysis significant for mild protein and small hemoglobin.   I - continue to monitor   Hyperbilirubinemia Mixed.  Possibly rated to fluid overload and resultant liver congestion.  Patient is asymptomatic.  Associated mildly elevated ALT of 60. -Follow-up bilirubin for improvement over time   Hyperlipidemia LDL of 161 noted on this morning's lipid panel.  Patient started on Lipitor per cardiology -Cardiology recommendations: Lipitor 40 mg daily   Abnormal x-ray Concern for possible pneumonia versus atelectasis. Patient's history not consistent with pneumonia. There may be Lynore Coscia component of edema. -Incentive spirometer -should have repeat CXR outpatient (or sooner if needed)   Obesity Body mass index is 35.08 kg/m.    DVT prophylaxis: lovenox Code Status: full Family Communication: father at bedside Disposition:   Status is: Inpatient Remains inpatient appropriate because: pending cardiology clearance for discharge   Consultants:  cardiology  Procedures:  R/LHC   The left ventricular ejection fraction is 25-35% by visual estimate.   Findings:   Ao = 135/92 (110) LV = 131/14 RA = 3 RV = 30/8 PA = 28/11 (17) PCW = 5 Fick cardiac output/index = 5.5/2.3 PVR = 2.2 WU FA sat = 93$ PA sat = 66%, 70%   Assessment: 1. NICM EF 30% 2. No CAD 3. Well-compensated filling pressures   Plan/Discussion:    Suspect HTN CM. Titrate GDMT. Plan cMRI.   Echo IMPRESSIONS     1. Left ventricular ejection fraction, by estimation, is 25 to 30%. The  left ventricle has severely decreased function. The left ventricle  demonstrates global hypokinesis. There is severe concentric left  ventricular hypertrophy. Indeterminate diastolic  filling due to E-Karon Cotterill fusion.   2. Right ventricular systolic function is mildly reduced. The right  ventricular size is moderately enlarged. Tricuspid regurgitation signal is  inadequate for assessing PA pressure.   3. The mitral valve is grossly normal. Trivial mitral valve  regurgitation. No evidence of mitral  stenosis.   4. The aortic valve is tricuspid. Aortic valve regurgitation is not  visualized. No aortic stenosis is present.   5. The inferior vena cava is normal in size with greater than 50%  respiratory variability, suggesting right atrial pressure of 3 mmHg.   Antimicrobials:  Anti-infectives (From admission, onward)    None       Subjective: No complaints  Objective: Vitals:   01/31/23 0435 01/31/23 0814 01/31/23 1130 01/31/23 1556  BP: (!) 138/96 (!) 144/105 (!) 138/97 (!) 143/106  Pulse: 90 96 92 86  Resp: 16 20 20 18   Temp: 98.1 F (36.7 C) (!) 97.5 F (36.4 C) 98.1 F (36.7 C) 98.2 F (36.8 C)  TempSrc: Oral Oral Oral Oral  SpO2: 97% 98% 97% 94%  Weight: 112.5 kg     Height:        Intake/Output Summary (Last 24 hours) at 01/31/2023 1813 Last data filed at 01/31/2023 1736 Gross per 24 hour  Intake 600 ml  Output 1450 ml  Net -850 ml   Filed Weights   01/29/23 0425 01/30/23 0619 01/31/23 0435  Weight: 118.9 kg 111.8 kg 112.5 kg    Examination:  General: No acute distress. Cardiovascular: RRR Lungs: unlabored Abdomen: Soft, nontender, nondistended  Neurological: Alert and oriented 3. Moves all extremities 4 with equal strength. Cranial nerves II through XII grossly intact. Extremities: No clubbing or cyanosis. No edema.    Data Reviewed: I have personally reviewed following labs and imaging studies  CBC: Recent Labs  Lab 01/28/23 0952 01/29/23 0036 01/30/23 0008 01/30/23 0915 01/30/23 0919 01/31/23 0006  WBC 11.2* 14.0* 15.6*  --   --  14.0*  NEUTROABS 7.2  --   --   --   --   --   HGB 14.1 14.6 17.0 15.6 13.6  17.0 14.7  HCT 43.9 43.4 52.0 46.0 40.0  50.0 44.7  MCV 89.4 84.1 86.2  --   --  85.5  PLT 196 220 241  --   --  226    Basic Metabolic Panel: Recent Labs  Lab 01/28/23 0952 01/29/23 0036 01/30/23 0008 01/30/23 0915 01/30/23 0919 01/31/23 0006  NA 137 139 140 144 150*  142 137  K 4.3 4.5 3.6 3.1* 2.5*  3.5 3.9  CL  102 99 94*  --   --  98  CO2 25 28 33*  --   --  29  GLUCOSE 167* 203* 160*  --   --  205*  BUN 19 22* 24*  --   --  23*  CREATININE 1.52* 1.65* 1.79*  --   --  1.55*  CALCIUM 9.2 9.4 9.9  --   --  9.0    GFR: Estimated Creatinine Clearance: 75.4 mL/min (Shontez Sermon) (by C-G formula based on SCr of 1.55 mg/dL (H)).  Liver Function Tests: Recent Labs  Lab 01/28/23 0952 01/29/23 0036  AST 32 37  ALT 58* 60*  ALKPHOS 39 37*  BILITOT 1.5* 1.5*  PROT 6.3* 6.4*  ALBUMIN 3.4* 3.5    CBG: Recent Labs  Lab 01/30/23 1616 01/30/23 2110 01/31/23 0618 01/31/23 1132 01/31/23 1554  GLUCAP 234* 191* 186* 143* 128*     No results found for this or any previous visit (from the past 240 hour(s)).       Radiology Studies: MR CARDIAC VELOCITY FLOW MAP  Result Date: 01/31/2023 CLINICAL DATA:  Cardiomyopathy EXAM: CARDIAC MRI TECHNIQUE: The patient was scanned on Ryun Velez 1.5 Tesla Siemens magnet. Lakeitha Basques dedicated cardiac coil was used. Functional imaging was done using Fiesta sequences. 2,3, and 4 chamber views were done to assess for RWMA's. Modified Simpson's rule using Skipper Dacosta short axis stack was used to calculate an ejection fraction on Ivie Savitt dedicated work [employer], [employer]. The patient received 10 cc of Gadavist. After 10 minutes inversion recovery sequences were used to assess for infiltration and scar tissue. Cardiac output was estimated using flow analysis through orthogonal plane of the aortic annulus CONTRAST:  Gadavist FINDINGS: Mild LAE. Normal RA. No PFO/ASD. Small non circumferential pericardial effusion Normal ascending thoracic aorta 3.0 cm. Normal tri leaflet AV. Normal MV. Normal RVOT and TV/PV. Moderate LVH 15 mm septal thickness. Severe LVE with global hypokinesis. Mild RVE with moderate hypokinesis. Quantitative LVEF 12% (EDV 303 cc ESV 266 cc SV 38 cc) Estimated cardiac output 3.1 L/min Quantitative RVEF 25% (EDV 165 cc ESV 124 cc SV 41 cc ) Delayed gadolinium images show incomplete  nulling with diffuse mostly mid myocardial uptake but does extend in some areas to the sub epicardium and sub endocardium Parametric Measures using Hct of 44 T1 elevated 1119 msec ECV elevated 39% T2 normal 45 msec IMPRESSION: 1.  Estimated cardiac output 3.1 L/min Charlton Haws Electronically Signed   By: Charlton Haws M.D.   On: 01/31/2023 09:21   MR CARDIAC MORPHOLOGY W WO CONTRAST  Result Date: 01/31/2023 CLINICAL DATA:  Cardiomyopathy EXAM: CARDIAC MRI TECHNIQUE: The patient was scanned on Ojani Berenson 1.5 Tesla Siemens magnet. Ellisha Bankson dedicated cardiac coil was used. Functional imaging was done using Fiesta sequences. 2,3, and 4 chamber views were done to assess for RWMA's. Modified Simpson's rule using Timika Muench short axis stack was used to calculate an ejection fraction on Lavonia Eager dedicated work [employer] officer, [employer]. The patient received 10 cc of Gadavist. After 10 minutes inversion recovery sequences were used to assess for infiltration and scar tissue. CONTRAST:  Gadavist FINDINGS: Mild LAE. Normal RA. No PFO/ASD. Small non circumferential pericardial effusion Normal ascending thoracic aorta 3.0 cm. Normal tri leaflet AV. Normal MV. Normal RVOT and TV/PV. Moderate LVH 15 mm septal thickness. Severe LVE with global hypokinesis. Mild RVE with moderate hypokinesis. Quantitative LVEF 12% (EDV 303 cc ESV 266 cc SV 38 cc) Estimated cardiac output 3.1 L/min Quantitative RVEF 25% (EDV 165 cc ESV 124 cc SV 41 cc ) Delayed gadolinium images show incomplete nulling with diffuse mostly mid myocardial uptake but does extend in some areas to the sub epicardium and sub endocardium Parametric Measures using Hct of 44 T1 elevated 1119 msec ECV elevated 39% T2 normal 45 msec IMPRESSION: 1.  Moderate LVH with severe LVE and global hypokinesis LVEF 12% 2. Diffuse gadolinium uptake mostly mid myocardial with elevated T1 and ECV consistent with amyloidosis or infiltrative cardiomyopathy 3.  Mild RVE with moderate hypokinesis RVEF 25% 4.  Small  non circumferential pericardial effusion 5.  Normal T2 but elevated T1 and ECV see above 6.  Estimated cardiac output 3.1 L/min Charlton Haws Electronically Signed   By: Charlton Haws M.D.   On: 01/31/2023 09:19   CARDIAC CATHETERIZATION  Result Date: 01/30/2023   The left ventricular ejection fraction is 25-35% by visual estimate. Findings: Ao = 135/92 (110) LV = 131/14 RA = 3 RV = 30/8 PA = 28/11 (17) PCW = 5 Fick cardiac output/index = 5.5/2.3 PVR = 2.2 WU FA sat =  93$ PA sat = 66%, 70% Assessment: 1. NICM EF 30% 2. No CAD 3. Well-compensated filling pressures Plan/Discussion: Suspect HTN CM. Titrate GDMT. Plan cMRI. Arvilla Meres, MD 10:53 AM       Scheduled Meds:  atorvastatin  80 mg Oral Daily   empagliflozin  10 mg Oral Daily   enoxaparin (LOVENOX) injection  50 mg Subcutaneous Q24H   insulin aspart  0-9 Units Subcutaneous TID WC   potassium chloride  60 mEq Oral Once   sacubitril-valsartan  1 tablet Oral BID   sodium chloride flush  3 mL Intravenous Q12H   sodium chloride flush  3 mL Intravenous Q12H   spironolactone  25 mg Oral Daily   Continuous Infusions:  sodium chloride       LOS: 2 days    Time spent: over 30 min    Lacretia Nicks, MD [facility]   To contact the attending provider between 7A-7P or the covering provider during after hours 7P-7A, please log into the web site [email or web address] and access using universal [place] password for that web site. If you do not have the password, please call the hospital operator.  01/31/2023, 6:13 PM

## 2023-01-31 NOTE — Progress Notes (Addendum)
Advanced Heart Failure Rounding Note  PCP-Cardiologist: None   Subjective:    BP improving.   Scr improving 1.55 today  L/RHC 6/12: No CAD, NICM EF 30%, RA 3, PA 28/11 (17), PCW 5, Fick CO/CI 5.5/2.3, PVR 2.2  Had cMRI this morning, official read pending. Feels good, ready to go home. Denies CP/SOB.   Objective:   Weight Range: 112.5 kg Body mass index is 35.08 kg/m.   Vital Signs:   Temp:  [97.5 F (36.4 C)-98.4 F (36.9 C)] 98.1 F (36.7 C) (06/13 0435) Pulse Rate:  [0-102] 90 (06/13 0435) Resp:  [10-21] 16 (06/13 0435) BP: (128-166)/(85-147) 138/96 (06/13 0435) SpO2:  [92 %-99 %] 97 % (06/13 0435) Weight:  [112.5 kg] 112.5 kg (06/13 0435) Last BM Date : 01/30/23  Weight change: Filed Weights   01/29/23 0425 01/30/23 0619 01/31/23 0435  Weight: 118.9 kg 111.8 kg 112.5 kg    Intake/Output:   Intake/Output Summary (Last 24 hours) at 01/31/2023 0738 Last data filed at 01/30/2023 2100 Gross per 24 hour  Intake 600 ml  Output 1775 ml  Net -1175 ml      Physical Exam  General:  well appearing, sitting on EOB.  No respiratory difficulty HEENT: normal Neck: supple. JVD flat. Carotids 2+ bilat; no bruits. No lymphadenopathy or thyromegaly appreciated. Cor: PMI nondisplaced. Regular rate & rhythm. No rubs, gallops or murmurs. Lungs: clear Abdomen: soft, nontender, nondistended. No hepatosplenomegaly. No bruits or masses. Good bowel sounds. Extremities: no cyanosis, clubbing, rash, edema  Neuro: alert & oriented x 3, cranial nerves grossly intact. moves all 4 extremities w/o difficulty. Affect pleasant.   Telemetry  Not back on tele yet (Personally reviewed)   Labs    CBC Recent Labs    01/28/23 0952 01/29/23 0036 01/30/23 0008 01/30/23 0915 01/30/23 0919 01/31/23 0006  WBC 11.2*   < > 15.6*  --   --  14.0*  NEUTROABS 7.2  --   --   --   --   --   HGB 14.1   < > 17.0   < > 13.6  17.0 14.7  HCT 43.9   < > 52.0   < > 40.0  50.0 44.7  MCV 89.4   <  > 86.2  --   --  85.5  PLT 196   < > 241  --   --  226   < > = values in this interval not displayed.   Basic Metabolic Panel Recent Labs    16/10/96 0008 01/30/23 0915 01/30/23 0919 01/31/23 0006  NA 140   < > 150*  142 137  K 3.6   < > 2.5*  3.5 3.9  CL 94*  --   --  98  CO2 33*  --   --  29  GLUCOSE 160*  --   --  205*  BUN 24*  --   --  23*  CREATININE 1.79*  --   --  1.55*  CALCIUM 9.9  --   --  9.0   < > = values in this interval not displayed.   Liver Function Tests Recent Labs    01/28/23 0952 01/29/23 0036  AST 32 37  ALT 58* 60*  ALKPHOS 39 37*  BILITOT 1.5* 1.5*  PROT 6.3* 6.4*  ALBUMIN 3.4* 3.5   No results for input(s): "LIPASE", "AMYLASE" in the last 72 hours. Cardiac Enzymes No results for input(s): "CKTOTAL", "CKMB", "CKMBINDEX", "TROPONINI" in the last 72 hours.  BNP: BNP (last 3 results) Recent Labs    01/28/23 0952  BNP 1,566.1*    ProBNP (last 3 results) No results for input(s): "PROBNP" in the last 8760 hours.   D-Dimer No results for input(s): "DDIMER" in the last 72 hours. Hemoglobin A1C Recent Labs    01/29/23 0036  HGBA1C 6.6*   Fasting Lipid Panel Recent Labs    01/29/23 0036  CHOL 226*  HDL 46  LDLCALC 161*  TRIG 93  CHOLHDL 4.9   Thyroid Function Tests Recent Labs    01/29/23 0036  TSH 0.496    Other results:   Imaging    CARDIAC CATHETERIZATION  Result Date: 01/30/2023   The left ventricular ejection fraction is 25-35% by visual estimate. Findings: Ao = 135/92 (110) LV = 131/14 RA = 3 RV = 30/8 PA = 28/11 (17) PCW = 5 Fick cardiac output/index = 5.5/2.3 PVR = 2.2 WU FA sat = 93$ PA sat = 66%, 70% Assessment: 1. NICM EF 30% 2. No CAD 3. Well-compensated filling pressures Plan/Discussion: Suspect HTN CM. Titrate GDMT. Plan cMRI. Arvilla Meres, MD 10:53 AM    Medications:     Scheduled Medications:  atorvastatin  80 mg Oral Daily   empagliflozin  10 mg Oral Daily   enoxaparin (LOVENOX) injection   50 mg Subcutaneous Q24H   insulin aspart  0-9 Units Subcutaneous TID WC   potassium chloride  60 mEq Oral Once   sacubitril-valsartan  1 tablet Oral BID   sodium chloride flush  3 mL Intravenous Q12H   sodium chloride flush  3 mL Intravenous Q12H   spironolactone  12.5 mg Oral Daily    Infusions:  sodium chloride      PRN Medications: sodium chloride, acetaminophen **OR** acetaminophen, acetaminophen, hydrALAZINE, ondansetron **OR** ondansetron (ZOFRAN) IV, ondansetron (ZOFRAN) IV, sodium chloride flush  Patient Profile   Michael Willis is a 47 y.o.  male with history of DM II, HTN, hyperlipidemia, obesity. AHF team to see for acute CHF.   Assessment/Plan  1. Acute systolic heart failure - May be d/t uncontrolled HTN. Has risk factors for CAD. Will need ischemic workup once diuresed. - Bedside echo done by Dr. Gala Romney 6/10 with EF ~25% and moderate to severe RV dysfunction.  - Echo 01/29/23: EF 25-30%, LV with GHK, severe concentric LHV, mildly reduced RV, trivial MR/TR - [facility] 6/12: No CAD, NICM EF 30%, RA 3, PA 28/11 (17), PCW 5, Fick CO/CI 5.5/2.3, PVR 2.2 - Lactic acid 1.6 - NYHA III. Euvolemic on exam, RA 3 on cath yesterday.  - Continue Entresto 97/103 mg BID. - Increase 12.5>25 mg spiro daily - Continue Jardiance 10 mg daily, Hgb A1c 6.6 - Holding off on beta blocker with acute CHF, now that he is diuresed can consider adding 3.125mg  coreg BID.  - would send home with PRN lasix 20 mg daily. Discussed taking for 3 lb overnight or 5 lb / week weight gain.  - strict I&O, daily weights   2. Uncontrolled HTN - PRN IV hydralazine for SBP > 160 - GDMT as above - BP around goal today 130s-140s.    3. DM II -Has been off meds for at least a few years -A1c 6.6   4. Obesity  -Body mass index is 35.08 kg/m.  -Sleep study as outpatient -Would benefit from GLP-1 agonist to assist with weight loss   5. AKI - unknown baseline - likely HTN and cardiorenal - Scr 1.65 ->  1.79-> 1.55, 1.1 several years  ago - expected rise in SCr with GDMT - Follow with diuresis  6. Hyperlipidemia - LDL 161 - Continue statin   SDOH: Uninsured. Consult HF TOC CM. Screen for medicaid. Will need assistance with medications.   AHF meds at discharge Atorvastatin 80 mg daily Jardiance 10 mg daily Entresto 97-103 mg BID Spironolactone 25 mg daily Lasix 20 mg daily PRN for weight gain  Length of Stay: 2  Alen Bleacher, NP  01/31/2023, 7:38 AM  Advanced Heart Failure Team Pager [phone] (M-F; 7a - 5p)  Please contact [facility] for night-coverage after hours (5p -7a ) and weekends on [email or web address]   Patient seen and examined with the above-signed Advanced Practice Provider and/or Housestaff. I personally reviewed laboratory data, imaging studies and relevant notes. I independently examined the patient and formulated the important aspects of the plan. I have edited the note to reflect any of my changes or salient points. I have personally discussed the plan with the patient and/or family.   Feeling better. No SOB, orthopnea or PND. BP better controlled.   cMRI LVEF 12% with suggestion of cardiac amyloidosis  General:  Well appearing. No resp difficulty HEENT: normal Neck: supple. no JVD. Carotids 2+ bilat; no bruits. No lymphadenopathy or thryomegaly appreciated. Cor: . Regular rate & rhythm. No rubs, gallops or murmurs. Lungs: clear Abdomen: soft, nontender, nondistended. No hepatosplenomegaly. No bruits or masses. Good bowel sounds. Extremities: no cyanosis, clubbing, rash, edema Neuro: alert & orientedx3, cranial nerves grossly intact. moves all 4 extremities w/o difficulty. 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 Will check myeloma panel and PYP scan.   We have confirmed with Nuclear rads that PYP tracer will be available for scan tomorrow.   Arvilla Meres, MD  3:05 PM

## 2023-02-01 ENCOUNTER — Telehealth (HOSPITAL_COMMUNITY): Payer: Self-pay | Admitting: Pharmacy Technician

## 2023-02-01 ENCOUNTER — Other Ambulatory Visit (HOSPITAL_COMMUNITY): Payer: Self-pay

## 2023-02-01 ENCOUNTER — Inpatient Hospital Stay (HOSPITAL_COMMUNITY): Payer: Medicaid Other

## 2023-02-01 LAB — CBC
HCT: 48.1 % (ref 39.0–52.0)
Hemoglobin: 15.8 g/dL (ref 13.0–17.0)
MCH: 28 pg (ref 26.0–34.0)
MCHC: 32.8 g/dL (ref 30.0–36.0)
MCV: 85.3 fL (ref 80.0–100.0)
Platelets: 218 10*3/uL (ref 150–400)
RBC: 5.64 MIL/uL (ref 4.22–5.81)
RDW: 12 % (ref 11.5–15.5)
WBC: 12.1 10*3/uL — ABNORMAL HIGH (ref 4.0–10.5)
nRBC: 0 % (ref 0.0–0.2)

## 2023-02-01 LAB — BASIC METABOLIC PANEL
Anion gap: 10 (ref 5–15)
BUN: 25 mg/dL — ABNORMAL HIGH (ref 6–20)
CO2: 28 mmol/L (ref 22–32)
Calcium: 9.1 mg/dL (ref 8.9–10.3)
Chloride: 100 mmol/L (ref 98–111)
Creatinine, Ser: 1.66 mg/dL — ABNORMAL HIGH (ref 0.61–1.24)
GFR, Estimated: 51 mL/min — ABNORMAL LOW (ref >60)
Glucose, Bld: 178 mg/dL — ABNORMAL HIGH (ref 70–99)
Potassium: 4.6 mmol/L (ref 3.5–5.1)
Sodium: 138 mmol/L (ref 135–145)

## 2023-02-01 LAB — GLUCOSE, CAPILLARY
Glucose-Capillary: 126 mg/dL — ABNORMAL HIGH (ref 70–99)
Glucose-Capillary: 179 mg/dL — ABNORMAL HIGH (ref 70–99)

## 2023-02-01 LAB — KAPPA/LAMBDA LIGHT CHAINS
Kappa free light chain: 52.8 mg/L — ABNORMAL HIGH (ref 3.3–19.4)
Kappa, lambda light chain ratio: 1.57 (ref 0.26–1.65)
Lambda free light chains: 33.6 mg/L — ABNORMAL HIGH (ref 5.7–26.3)

## 2023-02-01 MED ORDER — POTASSIUM IODIDE (ANTIDOTE) 130 MG PO TABS: 130.0000 mg | ORAL_TABLET | Freq: Once | ORAL | Status: DC

## 2023-02-01 MED ORDER — IOFLUPANE I 123 185 MBQ/2.5ML IV SOLN
4.6000 | Freq: Once | INTRAVENOUS | Status: DC | PRN
  Filled 2023-02-01: qty 5

## 2023-02-01 MED ORDER — SACUBITRIL-VALSARTAN 97-103 MG PO TABS
1.0000 | ORAL_TABLET | Freq: Two times a day (BID) | ORAL | 1 refills | Status: AC
  Filled 2023-02-01: qty 60, 30d supply, fill #0

## 2023-02-01 MED ORDER — FUROSEMIDE 20 MG PO TABS
20.0000 mg | ORAL_TABLET | Freq: Every day | ORAL | 1 refills | Status: DC | PRN
  Filled 2023-02-01: qty 30, 30d supply, fill #0

## 2023-02-01 MED ORDER — POTASSIUM IODIDE (ANTIDOTE) 130 MG PO TABS
130.0000 mg | ORAL_TABLET | Freq: Once | ORAL | Status: DC
  Filled 2023-02-01: qty 1

## 2023-02-01 MED ORDER — SPIRONOLACTONE 25 MG PO TABS
25.0000 mg | ORAL_TABLET | Freq: Every day | ORAL | 1 refills | Status: DC
  Filled 2023-02-01: qty 30, 30d supply, fill #0

## 2023-02-01 MED ORDER — EMPAGLIFLOZIN 10 MG PO TABS: 10.0000 mg | ORAL_TABLET | Freq: Every day | ORAL | Status: DC

## 2023-02-01 MED ORDER — SPIRONOLACTONE 25 MG PO TABS: 25.0000 mg | ORAL_TABLET | Freq: Every day | ORAL | Status: DC

## 2023-02-01 MED ORDER — TECHNETIUM TC 99M PYROPHOSPHATE
21.5000 | Freq: Once | INTRAVENOUS | Status: AC | PRN
  Administered 2023-02-01: 21.5 via INTRAVENOUS
  Filled 2023-02-01: qty 22

## 2023-02-01 MED ORDER — ATORVASTATIN CALCIUM 80 MG PO TABS
80.0000 mg | ORAL_TABLET | Freq: Every day | ORAL | 1 refills | Status: DC
  Filled 2023-02-01: qty 30, 30d supply, fill #0

## 2023-02-01 MED ORDER — CARVEDILOL 3.125 MG PO TABS
3.1250 mg | ORAL_TABLET | Freq: Two times a day (BID) | ORAL | 1 refills | Status: DC
  Filled 2023-02-01: qty 60, 30d supply, fill #0

## 2023-02-01 MED ORDER — EMPAGLIFLOZIN 10 MG PO TABS
10.0000 mg | ORAL_TABLET | Freq: Every day | ORAL | 1 refills | Status: AC
  Filled 2023-02-01: qty 30, 30d supply, fill #0

## 2023-02-01 NOTE — Telephone Encounter (Signed)
Advanced Heart Failure Patient Advocate Encounter  Patient is being discharged on Entresto and has no insurance. Started [employer].   Will send in once all signatures are obtained.

## 2023-02-01 NOTE — Progress Notes (Addendum)
Advanced Heart Failure Rounding Note  PCP-Cardiologist: None   Subjective:   BP improving.   Scr 1.66 today, suspect he may be a little on the dry side. -4.3L UOP yesterday.   L/RHC 6/12: No CAD, NICM EF 30%, RA 3, PA 28/11 (17), PCW 5, Fick CO/CI 5.5/2.3, PVR 2.2  cMRI LVEF 12% with suggestion of cardiac amyloidosis   Plan for PYP today. Feels good. Denies CP/SOB.   Objective:   Weight Range: 112.4 kg Body mass index is 35.05 kg/m.   Vital Signs:   Temp:  [97.5 F (36.4 C)-98.3 F (36.8 C)] 97.9 F (36.6 C) (06/14 0416) Pulse Rate:  [85-96] 85 (06/14 0416) Resp:  [18-20] 18 (06/14 0416) BP: (138-151)/(91-106) 142/91 (06/14 0416) SpO2:  [94 %-99 %] 97 % (06/14 0416) Weight:  [112.4 kg] 112.4 kg (06/14 0416) Last BM Date : 01/30/23  Weight change: Filed Weights   01/30/23 0619 01/31/23 0435 02/01/23 0416  Weight: 111.8 kg 112.5 kg 112.4 kg   Intake/Output:   Intake/Output Summary (Last 24 hours) at 02/01/2023 0704 Last data filed at 02/01/2023 1610 Gross per 24 hour  Intake 600 ml  Output 4300 ml  Net -3700 ml    Physical Exam  General:  well appearing, sitting on EOB.  No respiratory difficulty HEENT: normal Neck: supple. JVD flat. Carotids 2+ bilat; no bruits. No lymphadenopathy or thyromegaly appreciated. Cor: PMI nondisplaced. Regular rate & rhythm. No rubs, gallops or murmurs. Lungs: clear Abdomen: soft, nontender, nondistended. No hepatosplenomegaly. No bruits or masses. Good bowel sounds. Extremities: no cyanosis, clubbing, rash, edema  Neuro: alert & oriented x 3, cranial nerves grossly intact. moves all 4 extremities w/o difficulty. Affect pleasant.   Telemetry  NSR 80s (Personally reviewed)   Labs    CBC Recent Labs    01/31/23 0006 02/01/23 0202  WBC 14.0* 12.1*  HGB 14.7 15.8  HCT 44.7 48.1  MCV 85.5 85.3  PLT 226 218   Basic Metabolic Panel Recent Labs    96/04/54 0006 02/01/23 0202  NA 137 138  K 3.9 4.6  CL 98 100  CO2  29 28  GLUCOSE 205* 178*  BUN 23* 25*  CREATININE 1.55* 1.66*  CALCIUM 9.0 9.1   Liver Function Tests No results for input(s): "AST", "ALT", "ALKPHOS", "BILITOT", "PROT", "ALBUMIN" in the last 72 hours.  No results for input(s): "LIPASE", "AMYLASE" in the last 72 hours. Cardiac Enzymes No results for input(s): "CKTOTAL", "CKMB", "CKMBINDEX", "TROPONINI" in the last 72 hours.  BNP: BNP (last 3 results) Recent Labs    01/28/23 0952  BNP 1,566.1*    ProBNP (last 3 results) No results for input(s): "PROBNP" in the last 8760 hours.   D-Dimer No results for input(s): "DDIMER" in the last 72 hours. Hemoglobin A1C No results for input(s): "HGBA1C" in the last 72 hours.  Fasting Lipid Panel No results for input(s): "CHOL", "HDL", "LDLCALC", "TRIG", "CHOLHDL", "LDLDIRECT" in the last 72 hours.  Thyroid Function Tests No results for input(s): "TSH", "T4TOTAL", "T3FREE", "THYROIDAB" in the last 72 hours.  Invalid input(s): "FREET3"  Other results:  Imaging    MR CARDIAC VELOCITY FLOW MAP  Result Date: 01/31/2023 CLINICAL DATA:  Cardiomyopathy EXAM: CARDIAC MRI TECHNIQUE: The patient was scanned on a 1.5 Tesla Siemens magnet. A dedicated cardiac coil was used. Functional imaging was done using Fiesta sequences. 2,3, and 4 chamber views were done to assess for RWMA's. Modified Simpson's rule using a short axis stack was used to calculate an  ejection fraction on a dedicated work [employer]. The patient received 10 cc of Gadavist. After 10 minutes inversion recovery sequences were used to assess for infiltration and scar tissue. Cardiac output was estimated using flow analysis through orthogonal plane of the aortic annulus CONTRAST:  Gadavist FINDINGS: Mild LAE. Normal RA. No PFO/ASD. Small non circumferential pericardial effusion Normal ascending thoracic aorta 3.0 cm. Normal tri leaflet AV. Normal MV. Normal RVOT and TV/PV. Moderate LVH 15 mm septal thickness. Severe  LVE with global hypokinesis. Mild RVE with moderate hypokinesis. Quantitative LVEF 12% (EDV 303 cc ESV 266 cc SV 38 cc) Estimated cardiac output 3.1 L/min Quantitative RVEF 25% (EDV 165 cc ESV 124 cc SV 41 cc ) Delayed gadolinium images show incomplete nulling with diffuse mostly mid myocardial uptake but does extend in some areas to the sub epicardium and sub endocardium Parametric Measures using Hct of 44 T1 elevated 1119 msec ECV elevated 39% T2 normal 45 msec IMPRESSION: 1.  Estimated cardiac output 3.1 L/min Charlton Haws Electronically Signed   By: Charlton Haws M.D.   On: 01/31/2023 09:21   MR CARDIAC MORPHOLOGY W WO CONTRAST  Result Date: 01/31/2023 CLINICAL DATA:  Cardiomyopathy EXAM: CARDIAC MRI TECHNIQUE: The patient was scanned on a 1.5 Tesla Siemens magnet. A dedicated cardiac coil was used. Functional imaging was done using Fiesta sequences. 2,3, and 4 chamber views were done to assess for RWMA's. Modified Simpson's rule using a short axis stack was used to calculate an ejection fraction on a dedicated work [employer], [employer]. The patient received 10 cc of Gadavist. After 10 minutes inversion recovery sequences were used to assess for infiltration and scar tissue. CONTRAST:  Gadavist FINDINGS: Mild LAE. Normal RA. No PFO/ASD. Small non circumferential pericardial effusion Normal ascending thoracic aorta 3.0 cm. Normal tri leaflet AV. Normal MV. Normal RVOT and TV/PV. Moderate LVH 15 mm septal thickness. Severe LVE with global hypokinesis. Mild RVE with moderate hypokinesis. Quantitative LVEF 12% (EDV 303 cc ESV 266 cc SV 38 cc) Estimated cardiac output 3.1 L/min Quantitative RVEF 25% (EDV 165 cc ESV 124 cc SV 41 cc ) Delayed gadolinium images show incomplete nulling with diffuse mostly mid myocardial uptake but does extend in some areas to the sub epicardium and sub endocardium Parametric Measures using Hct of 44 T1 elevated 1119 msec ECV elevated 39% T2 normal 45 msec IMPRESSION: 1.   Moderate LVH with severe LVE and global hypokinesis LVEF 12% 2. Diffuse gadolinium uptake mostly mid myocardial with elevated T1 and ECV consistent with amyloidosis or infiltrative cardiomyopathy 3.  Mild RVE with moderate hypokinesis RVEF 25% 4.  Small non circumferential pericardial effusion 5.  Normal T2 but elevated T1 and ECV see above 6.  Estimated cardiac output 3.1 L/min Charlton Haws Electronically Signed   By: Charlton Haws M.D.   On: 01/31/2023 09:19    Medications:   Scheduled Medications:  atorvastatin  80 mg Oral Daily   empagliflozin  10 mg Oral Daily   enoxaparin (LOVENOX) injection  50 mg Subcutaneous Q24H   insulin aspart  0-9 Units Subcutaneous TID WC   potassium chloride  60 mEq Oral Once   sacubitril-valsartan  1 tablet Oral BID   sodium chloride flush  3 mL Intravenous Q12H   sodium chloride flush  3 mL Intravenous Q12H   spironolactone  25 mg Oral Daily    Infusions:  sodium chloride     PRN Medications: sodium chloride, acetaminophen **OR** acetaminophen, acetaminophen, hydrALAZINE, ondansetron **OR**  ondansetron (ZOFRAN) IV, ondansetron (ZOFRAN) IV, sodium chloride flush  Patient Profile   Michael Willis is a 47 y.o.  male with history of DM II, HTN, hyperlipidemia, obesity. AHF team to see for acute CHF.   Assessment/Plan  1. Acute systolic heart failure - May be d/t uncontrolled HTN. Has risk factors for CAD. Will need ischemic workup once diuresed. - Bedside echo done by Dr. Gala Romney 6/10 with EF ~25% and moderate to severe RV dysfunction.  - Echo 01/29/23: EF 25-30%, LV with GHK, severe concentric LHV, mildly reduced RV, trivial MR/TR - [facility] 6/12: No CAD, NICM EF 30%, RA 3, PA 28/11 (17), PCW 5, Fick CO/CI 5.5/2.3, PVR 2.2 - cMRI LVEF 12% with suggestion of cardiac amyloidosis. Family history strongly suggestive of familial TTR cardiac amyloidosis. Plan for PYP scan today. MMP sent yesterday.  - NYHA III. Euvolemic on exam, RA 3 on cath 6/12.  - Continue  Entresto 97/103 mg BID. - Continue 25 mg spiro daily - Hold Jardiance and spiro today, suspect he may be a little on the dry side. Plan to restart tomorrow.  - Holding off on beta blocker with acute CHF, now that he is diuresed can consider adding 3.125mg  coreg BID.  - would send home with PRN lasix 20 mg daily. Discussed taking for 3 lb overnight or 5 lb / week weight gain.  - strict I&O, daily weights   2. Uncontrolled HTN - PRN IV hydralazine for SBP > 160 - GDMT as above - BP around goal today 130s-140s.    3. DM II -Has been off meds for at least a few years -A1c 6.6   4. Obesity  -Body mass index is 35.05 kg/m.  -Sleep study as outpatient -Would benefit from GLP-1 agonist to assist with weight loss   5. AKI - unknown baseline - likely HTN and cardiorenal - Scr 1.65 -> 1.79-> 1.55->1.66, 1.1 several years ago - suspect he may be slightly dry with bump in SCr and -4.3L UOP, holding some GDMT today. Restart tomorrow.   6. Hyperlipidemia - LDL 161 - Continue statin   SDOH: Uninsured. Consult HF TOC CM. Screen for medicaid. Will need assistance with medications.   AHF meds at discharge Atorvastatin 80 mg daily Jardiance 10 mg daily Entresto 97-103 mg BID Spironolactone 25 mg daily Lasix 20 mg daily PRN for weight gain  Length of Stay: 3  Alen Bleacher, NP  02/01/2023, 7:04 AM  Advanced Heart Failure Team Pager [phone] (M-F; 7a - 5p)  Please contact [facility] for night-coverage after hours (5p -7a ) and weekends on [email or web address]   Patient seen and examined with the above-signed Advanced Practice Provider and/or Housestaff. I personally reviewed laboratory data, imaging studies and relevant notes. I independently examined the patient and formulated the important aspects of the plan. I have edited the note to reflect any of my changes or salient points. I have personally discussed the plan with the patient and/or family.  Feels ok. Denies CP or SOB For PYP this  afternoon  General:  Well appearing. No resp difficulty HEENT: normal Neck: supple. no JVD. Carotids 2+ bilat; no bruits. No lymphadenopathy or thryomegaly appreciated. Cor: PMI nondisplaced. Regular rate & rhythm. No rubs, gallops or murmurs. Lungs: clear Abdomen: soft, nontender, nondistended. No hepatosplenomegaly. No bruits or masses. Good bowel sounds. Extremities: no cyanosis, clubbing, rash, edema Neuro: alert & orientedx3, cranial nerves grossly intact. moves all 4 extremities w/o difficulty. Affect pleasant  Suspect TTR cardiac amyloid. Await  PYP scan today and myeloma studies.   Home after PYP and f/u in Clinic.   Arvilla Meres, MD  10:52 AM

## 2023-02-01 NOTE — Progress Notes (Addendum)
Pt has orders to be discharged. Discharge instructions given and pt has no additional questions at this time. Telemetry box removed. IV removed and site in good condition. Pt stable and waiting for TOC medications to be delivered. 24hr urine already delivered to lab for processing.

## 2023-02-01 NOTE — TOC Transition Note (Addendum)
Transition of Care [facility]) - CM/SW Discharge Note   Patient Details  Name: CYPRUS OSKEY MRN: [id number] Date of Birth: 1976-08-10  Transition of Care [facility]) CM/SW Contact:  Leone Haven, RN Phone Number: 02/01/2023, 2:48 PM   Clinical Narrative:    Patient is for dc today, the previous NCM  has schedule follow up apts on AVS.  TOC to fill meds with HF fund and Match.  His wife will transport him home at dc. [facility] pharmacy states they will see if they can use the HF fund. TOC will use the HF fund, cost is zero dollars. NCM contacted [employer] to assist with patient assistance for entresto and jardiance. NCM scanned the signed forms back to [facility] for patient assistance for [employer].     Barriers to Discharge: Continued Medical Work up   Patient Goals and CMS Choice      Discharge Placement                         Discharge Plan and Services Additional resources added to the After Visit Summary for     Discharge Planning Services: CM Consult                                 Social Determinants of Health (SDOH) Interventions SDOH Screenings   Food Insecurity: No Food Insecurity (01/28/2023)  Housing: Low Risk  (01/28/2023)  Transportation Needs: No Transportation Needs (01/28/2023)  Utilities: Not At Risk (01/28/2023)  Depression (PHQ2-9): Low Risk  (05/08/2019)  Tobacco Use: Medium Risk (01/31/2023)     Readmission Risk Interventions     No data to display

## 2023-02-01 NOTE — Discharge Summary (Signed)
Physician Discharge Summary  WAITMAN GAMMAGE ZOX:[id number] DOB: [birth year] DOA: 01/28/2023  PCP: Wanda Plump, MD  Admit date: 01/28/2023 Discharge date: 02/01/2023  Time spent: 40 minutes  Recommendations for Outpatient Follow-up:  Follow outpatient CBC/CMP  Follow with cardiology outpatient - review pending PYP scan and myeloma studies Establish with PCP and cards outpatient Follow BP/renal function on new BP meds Follow BG outpatient, adjust diabetes regimen as needed Follow creatinine outpatient on new meds Needs repeat CXR outpatient in 3-4 weeks Proteinuria, follow outpatient with PCP   Outpatient sleep study  Discharge Diagnoses:  Principal Problem:   Acute systolic heart failure (HCC) Active Problems:   Hypertensive urgency   Acute heart failure (HCC)   Discharge Condition: stable  Diet recommendation: heart healthy, diabetic  Filed Weights   01/30/23 0619 01/31/23 0435 02/01/23 0416  Weight: 111.8 kg 112.5 kg 112.4 kg    History of present illness:   Michael Willis is [name] 47 y.o. male with Lakin Romer history of diabetes, hypertension. Patient presented secondary to persistent dyspnea on exertion with development of bilateral lower extremity edema. On admission, patient was found to have evidence of 6 severely elevated hypertension patient evidence of heart failure. Advanced heart failure team was consulted on admission with bedside echocardiogram suggesting LVEF of 25%. Patient started on IV diuresis and antihypertensive medications.   He improved from volume status with diuretics.  He's now s/p L/RHC (no CAD) and cardiac MRI which was concerning for possible amyloidosis or infiltrative cardiomyopathy.  Lanny Donoso PYP scan is pending at the time of discharge.  Myeloma studies are also pending.   See below for additional details  Hospital Course:  Assessment and Plan:  Acute systolic heart failure Echo with EF 25-30%, global hypokinesis, severe concentric LVH, indeterminate diastolic  filling, mildly reduced RVSF Appreciate cardiology recommendations - suspected hypertensive cardiomyopathy S/p L/RHC today, no CAD  Cardiac MRI with moderate LVH with severe LVE and global hypokinesis (LVEF 12%), diffuse gadolinium uptake mostly mid myocardial with elevated T1 and ECV consistent with amyloidosis or infiltrative cardiomyopathy (see report) PYP scan 6/14 - follow with cards outpatient, myeloma panel  Entresto, spironolactone, jardiance, coreg Needs outpatient sleep study Strict I/O, daily weights   Severe asymptomatic hypertension Adjust BP meds as tolerated/able Continue entresto, spironolactone, jardiance, coreg   Diabetes mellitus type 2 Controlled based on hemoglobin A1C of 6.6%.  Jardiance Follow with PCP outpatient   AKI Most recent baseline creatinine of 1.1 from 2020.  AKI versus possible CKD.  Patient with Ajai Terhaar creatinine of 1.52 on admission and normal BUN.  Urinalysis significant for mild protein and small hemoglobin.  I - continue to monitor on diuretics and entresto - follow outpatient  - UA with 0-5 RBC's, 100 mg/dl protein - follow with PCP outpatient   Hyperbilirubinemia Mixed.  Possibly rated to fluid overload and resultant liver congestion.  Patient is asymptomatic.  Associated mildly elevated ALT of 60. -mild, follow outpatient   Hyperlipidemia LDL of 161 noted on this morning's lipid panel.  Patient started on Lipitor per cardiology -Cardiology recommendations: Lipitor 80 mg daily   Abnormal x-ray Concern for possible pneumonia versus atelectasis. Patient's history not consistent with pneumonia. There may be Sayana Salley component of edema. -Incentive spirometer -should have repeat CXR outpatient (or sooner if needed)   Obesity Body mass index is 35.08 kg/m.     Procedures: L/RHC   The left ventricular ejection fraction is 25-35% by visual estimate.   Findings:   Ao = 135/92 (110) LV =  131/14 RA = 3 RV = 30/8 PA = 28/11 (17) PCW = 5 Fick  cardiac output/index = 5.5/2.3 PVR = 2.2 WU FA sat = 93$ PA sat = 66%, 70%   Assessment: 1. NICM EF 30% 2. No CAD 3. Well-compensated filling pressures   Plan/Discussion:    Suspect HTN CM. Titrate GDMT. Plan cMRI.    Echo IMPRESSIONS     1. Left ventricular ejection fraction, by estimation, is 25 to 30%. The  left ventricle has severely decreased function. The left ventricle  demonstrates global hypokinesis. There is severe concentric left  ventricular hypertrophy. Indeterminate diastolic  filling due to E-Kaidynce Pfister fusion.   2. Right ventricular systolic function is mildly reduced. The right  ventricular size is moderately enlarged. Tricuspid regurgitation signal is  inadequate for assessing PA pressure.   3. The mitral valve is grossly normal. Trivial mitral valve  regurgitation. No evidence of mitral stenosis.   4. The aortic valve is tricuspid. Aortic valve regurgitation is not  visualized. No aortic stenosis is present.   5. The inferior vena cava is normal in size with greater than 50%  respiratory variability, suggesting right atrial pressure of 3 mmHg.   Consultations: cardiology  Discharge Exam: Vitals:   02/01/23 0825 02/01/23 1115  BP: (!) 128/103 (!) 144/95  Pulse: 91 89  Resp: 18 18  Temp: 97.6 F (36.4 C)   SpO2: 100% 95%   Eager to discharge  Wife at bedside  General: No acute distress. Cardiovascular: RRR Lungs: unlabored Neurological: Alert and oriented 3. Moves all extremities 4 with equal strength. Cranial nerves II through XII grossly intact. Extremities: No clubbing or cyanosis. No edema.   Discharge Instructions   Discharge Instructions     Call MD for:  temperature >100.4   Complete by: As directed    Diet - low sodium heart healthy   Complete by: As directed    Diet - low sodium heart healthy   Complete by: As directed    Discharge instructions   Complete by: As directed    You were seen for newly diagnosed heart failure.  We  think the most likely cause is from uncontrolled high blood pressure.   We've started you on several new medicines.  Continue your entresto, spironolactone, and coreg which are for your blood pressure and your heart failure.  The jardiance treats your diabetes and your heart failure.  Continue your lasix as needed for 3 lbs weight gain in 1 day or 5 lbs in 1 week.    Your echo showed an ejection fraction of 25-30%.  You had Nicolette Gieske cardiac catheterization that did not show coronary artery disease.  You also had Lataisha Colan cardiac MRI which was concerning for amyloidosis.  Cardiology is continuing to work this up.  You have pending labs (myeloma labs) and pending PYP scan which will be reviewed by cardiology and discussed with you in follow up.  Repeat Iszabella Hebenstreit chest x ray in Kaleea Penner few weeks with your PCP.   You should follow up with your PCP outpatient to discuss the presences of protein in your urine.   You should get Alaura Schippers sleep study as an outpatient.   Continue the jardiance for your diabetes.    Weight loss will be important.  You can discuss strategies with your PCP as an outpatient.  Establishing with cardiology and Daishawn Lauf new PCP outpatient will be important.  Return for new, recurrent, or worsening symptoms.  Please ask your PCP to request records from this  hospitalization so they know what was done and what the next steps will be.   Heart Failure patients record your daily weight using the same scale at the same time of day   Complete by: As directed    Increase activity slowly   Complete by: As directed    Increase activity slowly   Complete by: As directed    STOP any activity that causes chest pain, shortness of breath, dizziness, sweating, or exessive weakness   Complete by: As directed       Allergies as of 02/01/2023   No Known Allergies      Medication List     STOP taking these medications    amLODipine 10 MG tablet Commonly known as: NORVASC   aspirin EC 81 MG tablet   insulin NPH Human  100 UNIT/ML injection Commonly known as: NOVOLIN N   INSULIN SYRINGE .5CC/28G 28G X 1/2" 0.5 ML Misc   Janumet 50-1000 MG tablet Generic drug: sitaGLIPtin-metformin   losartan-hydrochlorothiazide 100-12.5 MG tablet Commonly known as: HYZAAR   metroNIDAZOLE 250 MG tablet Commonly known as: Flagyl   onetouch ultrasoft lancets       TAKE these medications    atorvastatin 80 MG tablet Commonly known as: LIPITOR Take 1 tablet (80 mg total) by mouth daily. What changed:  medication strength how much to take when to take this   blood glucose meter kit and supplies Dispense based on patient and insurance preference. Check blood sugar three times daily. DX: E11.9   carvedilol 3.125 MG tablet Commonly known as: Coreg Take 1 tablet (3.125 mg total) by mouth 2 (two) times daily.   empagliflozin 10 MG Tabs tablet Commonly known as: JARDIANCE Take 1 tablet (10 mg total) by mouth daily. Start taking on: February 02, 2023   furosemide 20 MG tablet Commonly known as: Lasix Take 1 tablet (20 mg total) by mouth daily as needed (weight gain 3 lbs in 1 day or 5 lbs in 1 week).   glucose blood test strip Check blood sugar three times daily   sacubitril-valsartan 97-103 MG Commonly known as: ENTRESTO Take 1 tablet by mouth 2 (two) times daily.   spironolactone 25 MG tablet Commonly known as: ALDACTONE Take 1 tablet (25 mg total) by mouth daily. Start taking on: February 02, 2023       No Known Allergies  Follow-up Information     [facility]. Go on 02/12/2023.   Why: @9 :30am Contact information: [address] [place] [address] [phone]        [facility] Follow up on 02/11/2023.   Specialty: Cardiology Why: Follow up in the Advanced Heart Failure Clinic 6/24 at 2:30 pm Entrance C, Free valet Please bring all medications with you to the appt. Contact  information: [address] [id number] Wilhemina Bonito [place] [address] [phone]                 The results of significant diagnostics from this hospitalization (including imaging, microbiology, ancillary and laboratory) are listed below for reference.    Significant Diagnostic Studies: MR CARDIAC VELOCITY FLOW MAP  Result Date: 01/31/2023 CLINICAL DATA:  Cardiomyopathy EXAM: CARDIAC MRI TECHNIQUE: The patient was scanned on Faizah Kandler 1.5 Tesla Siemens magnet. Lore Polka dedicated cardiac coil was used. Functional imaging was done using Fiesta sequences. 2,3, and 4 chamber views were done to assess for RWMA's. Modified Simpson's rule using Adhira Jamil short axis stack was used  to calculate an ejection fraction on Quandra Fedorchak dedicated work [employer]. The patient received 10 cc of Gadavist. After 10 minutes inversion recovery sequences were used to assess for infiltration and scar tissue. Cardiac output was estimated using flow analysis through orthogonal plane of the aortic annulus CONTRAST:  Gadavist FINDINGS: Mild LAE. Normal RA. No PFO/ASD. Small non circumferential pericardial effusion Normal ascending thoracic aorta 3.0 cm. Normal tri leaflet AV. Normal MV. Normal RVOT and TV/PV. Moderate LVH 15 mm septal thickness. Severe LVE with global hypokinesis. Mild RVE with moderate hypokinesis. Quantitative LVEF 12% (EDV 303 cc ESV 266 cc SV 38 cc) Estimated cardiac output 3.1 L/min Quantitative RVEF 25% (EDV 165 cc ESV 124 cc SV 41 cc ) Delayed gadolinium images show incomplete nulling with diffuse mostly mid myocardial uptake but does extend in some areas to the sub epicardium and sub endocardium Parametric Measures using Hct of 44 T1 elevated 1119 msec ECV elevated 39% T2 normal 45 msec IMPRESSION: 1.  Estimated cardiac output 3.1 L/min Charlton Haws Electronically Signed   By: Charlton Haws M.D.   On: 01/31/2023 09:21   MR CARDIAC MORPHOLOGY W WO CONTRAST  Result Date: 01/31/2023 CLINICAL DATA:   Cardiomyopathy EXAM: CARDIAC MRI TECHNIQUE: The patient was scanned on Faheem Ziemann 1.5 Tesla Siemens magnet. Asahd Can dedicated cardiac coil was used. Functional imaging was done using Fiesta sequences. 2,3, and 4 chamber views were done to assess for RWMA's. Modified Simpson's rule using Alicia Seib short axis stack was used to calculate an ejection fraction on Carrah Eppolito dedicated work [employer] officer[employer]. The patient received 10 cc of Gadavist. After 10 minutes inversion recovery sequences were used to assess for infiltration and scar tissue. CONTRAST:  Gadavist FINDINGS: Mild LAE. Normal RA. No PFO/ASD. Small non circumferential pericardial effusion Normal ascending thoracic aorta 3.0 cm. Normal tri leaflet AV. Normal MV. Normal RVOT and TV/PV. Moderate LVH 15 mm septal thickness. Severe LVE with global hypokinesis. Mild RVE with moderate hypokinesis. Quantitative LVEF 12% (EDV 303 cc ESV 266 cc SV 38 cc) Estimated cardiac output 3.1 L/min Quantitative RVEF 25% (EDV 165 cc ESV 124 cc SV 41 cc ) Delayed gadolinium images show incomplete nulling with diffuse mostly mid myocardial uptake but does extend in some areas to the sub epicardium and sub endocardium Parametric Measures using Hct of 44 T1 elevated 1119 msec ECV elevated 39% T2 normal 45 msec IMPRESSION: 1.  Moderate LVH with severe LVE and global hypokinesis LVEF 12% 2. Diffuse gadolinium uptake mostly mid myocardial with elevated T1 and ECV consistent with amyloidosis or infiltrative cardiomyopathy 3.  Mild RVE with moderate hypokinesis RVEF 25% 4.  Small non circumferential pericardial effusion 5.  Normal T2 but elevated T1 and ECV see above 6.  Estimated cardiac output 3.1 L/min Charlton Haws Electronically Signed   By: Charlton Haws M.D.   On: 01/31/2023 09:19   CARDIAC CATHETERIZATION  Result Date: 01/30/2023   The left ventricular ejection fraction is 25-35% by visual estimate. Findings: Ao = 135/92 (110) LV = 131/14 RA = 3 RV = 30/8 PA = 28/11 (17) PCW = 5 Fick cardiac  output/index = 5.5/2.3 PVR = 2.2 WU FA sat = 93$ PA sat = 66%, 70% Assessment: 1. NICM EF 30% 2. No CAD 3. Well-compensated filling pressures Plan/Discussion: Suspect HTN CM. Titrate GDMT. Plan cMRI. Arvilla Meres, MD 10:53 AM  ECHOCARDIOGRAM COMPLETE  Result Date: 01/29/2023    ECHOCARDIOGRAM REPORT   Patient Name:   Reeve Darsha Zumstein Nosbisch Date of  Exam: 01/29/2023 Medical Rec #:  [id number]      Height:       70.5 in Accession #:    [id number]     Weight:       262.2 lb Date of Birth:  29-Jan-1976      BSA:          2.354 m Patient Age:    47 years       BP:           168/99 mmHg Patient Gender: M              HR:           85 bpm. Exam Location:  Inpatient Procedure: 2D Echo, Color Doppler, Cardiac Doppler and Intracardiac            Opacification Agent Indications:    CHF  History:        Patient has no prior history of Echocardiogram examinations.                 CHF; Risk Factors:Hypertension, Sleep Apnea, Dyslipidemia and                 Diabetes.  Sonographer:    Milbert Coulter Referring Phys: [id number] RALPH Geselle Cardosa NETTEY IMPRESSIONS  1. Left ventricular ejection fraction, by estimation, is 25 to 30%. The left ventricle has severely decreased function. The left ventricle demonstrates global hypokinesis. There is severe concentric left ventricular hypertrophy. Indeterminate diastolic filling due to E-Amra Shukla fusion.  2. Right ventricular systolic function is mildly reduced. The right ventricular size is moderately enlarged. Tricuspid regurgitation signal is inadequate for assessing PA pressure.  3. The mitral valve is grossly normal. Trivial mitral valve regurgitation. No evidence of mitral stenosis.  4. The aortic valve is tricuspid. Aortic valve regurgitation is not visualized. No aortic stenosis is present.  5. The inferior vena cava is normal in size with greater than 50% respiratory variability, suggesting right atrial pressure of 3 mmHg. FINDINGS  Left Ventricle: Left ventricular ejection fraction, by estimation, is 25 to  30%. The left ventricle has severely decreased function. The left ventricle demonstrates global hypokinesis. Definity contrast agent was given IV to delineate the left ventricular endocardial borders. The left ventricular internal cavity size was normal in size. There is severe concentric left ventricular hypertrophy. Indeterminate diastolic filling due to E-Perina Salvaggio fusion. Right Ventricle: The right ventricular size is moderately enlarged. No increase in right ventricular wall thickness. Right ventricular systolic function is mildly reduced. Tricuspid regurgitation signal is inadequate for assessing PA pressure. Left Atrium: Left atrial size was normal in size. Right Atrium: Right atrial size was normal in size. Pericardium: Trivial pericardial effusion is present. Mitral Valve: The mitral valve is grossly normal. Trivial mitral valve regurgitation. No evidence of mitral valve stenosis. Tricuspid Valve: The tricuspid valve is grossly normal. Tricuspid valve regurgitation is trivial. No evidence of tricuspid stenosis. Aortic Valve: The aortic valve is tricuspid. Aortic valve regurgitation is not visualized. No aortic stenosis is present. Aortic valve mean gradient measures 3.0 mmHg. Aortic valve peak gradient measures 5.8 mmHg. Aortic valve area, by VTI measures 3.19 cm. Pulmonic Valve: The pulmonic valve was grossly normal. Pulmonic valve regurgitation is trivial. No evidence of pulmonic stenosis. Aorta: The aortic root and ascending aorta are structurally normal, with no evidence of dilitation. Venous: The inferior vena cava is normal in size with greater than 50% respiratory variability, suggesting right atrial pressure of 3 mmHg. IAS/Shunts: The atrial septum is grossly normal.  LEFT VENTRICLE PLAX 2D LVIDd:         5.50 cm   Diastology LVIDs:         5.00 cm   LV e' medial:  6.20 cm/s LV PW:         1.60 cm   LV e' lateral: 8.05 cm/s LV IVS:        1.60 cm LVOT diam:     2.50 cm LV SV:         59 LV SV Index:   25  LVOT Area:     4.91 cm  RIGHT VENTRICLE RV S prime:     12.30 cm/s TAPSE (M-mode): 1.9 cm LEFT ATRIUM             Index        RIGHT ATRIUM           Index LA diam:        4.30 cm 1.83 cm/m   RA Area:     17.00 cm LA Vol (A2C):   57.1 ml 24.25 ml/m  RA Volume:   44.80 ml  19.03 ml/m LA Vol (A4C):   51.9 ml 22.04 ml/m LA Biplane Vol: 55.7 ml 23.66 ml/m  AORTIC VALVE AV Area (Vmax):    3.41 cm AV Area (Vmean):   3.20 cm AV Area (VTI):     3.19 cm AV Vmax:           120.00 cm/s AV Vmean:          82.500 cm/s AV VTI:            0.186 m AV Peak Grad:      5.8 mmHg AV Mean Grad:      3.0 mmHg LVOT Vmax:         83.40 cm/s LVOT Vmean:        53.700 cm/s LVOT VTI:          0.121 m LVOT/AV VTI ratio: 0.65  AORTA Ao Root diam: 3.60 cm Ao Asc diam:  2.90 cm  SHUNTS Systemic VTI:  0.12 m Systemic Diam: 2.50 cm Lennie Odor MD Electronically signed by Lennie Odor MD Signature Date/Time: 01/29/2023/4:06:32 PM    Final    DG Chest 2 View  Result Date: 01/28/2023 CLINICAL DATA:  Back pain, shortness of breath EXAM: CHEST - 2 VIEW COMPARISON:  10/23/2018 FINDINGS: Hazy right basilar airspace disease which may reflect atelectasis versus pneumonia. No pleural effusion or pneumothorax. Mild cardiomegaly. No acute osseous abnormality. IMPRESSION: 1. Hazy right basilar airspace disease which may reflect atelectasis versus pneumonia. Electronically Signed   By: Elige Ko M.D.   On: 01/28/2023 10:59    Microbiology: No results found for this or any previous visit (from the past 240 hour(s)).   Labs: Basic Metabolic Panel: Recent Labs  Lab 01/28/23 0952 01/29/23 0036 01/30/23 0008 01/30/23 0915 01/30/23 0919 01/31/23 0006 02/01/23 0202  NA 137 139 140 144 150*  142 137 138  K 4.3 4.5 3.6 3.1* 2.5*  3.5 3.9 4.6  CL 102 99 94*  --   --  98 100  CO2 25 28 33*  --   --  29 28  GLUCOSE 167* 203* 160*  --   --  205* 178*  BUN 19 22* 24*  --   --  23* 25*  CREATININE 1.52* 1.65* 1.79*  --   --  1.55*  1.66*  CALCIUM 9.2 9.4 9.9  --   --  9.0 9.1  Liver Function Tests: Recent Labs  Lab 01/28/23 0952 01/29/23 0036  AST 32 37  ALT 58* 60*  ALKPHOS 39 37*  BILITOT 1.5* 1.5*  PROT 6.3* 6.4*  ALBUMIN 3.4* 3.5   No results for input(s): "LIPASE", "AMYLASE" in the last 168 hours. No results for input(s): "AMMONIA" in the last 168 hours. CBC: Recent Labs  Lab 01/28/23 0952 01/29/23 0036 01/30/23 0008 01/30/23 0915 01/30/23 0919 01/31/23 0006 02/01/23 0202  WBC 11.2* 14.0* 15.6*  --   --  14.0* 12.1*  NEUTROABS 7.2  --   --   --   --   --   --   HGB 14.1 14.6 17.0 15.6 13.6  17.0 14.7 15.8  HCT 43.9 43.4 52.0 46.0 40.0  50.0 44.7 48.1  MCV 89.4 84.1 86.2  --   --  85.5 85.3  PLT 196 220 241  --   --  226 218   Cardiac Enzymes: No results for input(s): "CKTOTAL", "CKMB", "CKMBINDEX", "TROPONINI" in the last 168 hours. BNP: BNP (last 3 results) Recent Labs    01/28/23 0952  BNP 1,566.1*    ProBNP (last 3 results) No results for input(s): "PROBNP" in the last 8760 hours.  CBG: Recent Labs  Lab 01/31/23 1132 01/31/23 1554 01/31/23 2108 02/01/23 0626 02/01/23 1114  GLUCAP 143* 128* 192* 126* 179*       Signed:  Lacretia Nicks MD.  [facility] 02/01/2023, 2:49 PM

## 2023-02-01 NOTE — Telephone Encounter (Signed)
Advanced Heart Failure Patient Advocate Encounter  Patient is being discharged on Jardiance and has no insurance. Started [facility] assistance application.   Will send in once all signatures are obtained.

## 2023-02-04 ENCOUNTER — Telehealth: Payer: Self-pay

## 2023-02-04 NOTE — Transitions of Care (Post Inpatient/ED Visit) (Signed)
   02/04/2023  Name: Michael Willis MRN: [id number] DOB: [birth year]  Today's TOC FU Call Status: Today's TOC FU Call Status:: Successful TOC FU Call Competed TOC FU Call Complete Date: 02/04/23  Transition Care Management Follow-up Telephone Call Date of Discharge: 02/01/23 Discharge Facility: [facility]) Type of Discharge: Inpatient Admission Primary Inpatient Discharge Diagnosis:: heart failure How have you been since you were released from the hospital?: Better Any questions or concerns?: No  Items Reviewed: Did you receive and understand the discharge instructions provided?: No Medications obtained,verified, and reconciled?: Yes (Medications Reviewed) Any new allergies since your discharge?: No Dietary orders reviewed?: Yes Do you have support at home?: Yes People in Home: spouse  Medications Reviewed Today: Medications Reviewed Today     Reviewed by Karena Addison, LPN (Licensed Practical Nurse) on 02/04/23 at 1110  Med List Status: <None>   Medication Order Taking? Sig Documenting Provider Last Dose Status Informant  atorvastatin (LIPITOR) 80 MG tablet [id number]  Take 1 tablet (80 mg total) by mouth daily. Zigmund Daniel., MD  Active   blood glucose meter kit and supplies [id number] No Dispense based on patient and insurance preference. Check blood sugar three times daily. DX: E11.9 Wanda Plump, MD Taking Active Self  carvedilol (COREG) 3.125 MG tablet [id number]  Take 1 tablet (3.125 mg total) by mouth 2 (two) times daily. Zigmund Daniel., MD  Active   empagliflozin (JARDIANCE) 10 MG TABS tablet [id number]  Take 1 tablet (10 mg total) by mouth daily. Zigmund Daniel., MD  Active   furosemide (LASIX) 20 MG tablet [id number]  Take 1 tablet (20 mg total) by mouth daily as needed (weight gain 3 lbs in 1 day or 5 lbs in 1 week). Zigmund Daniel., MD  Active   glucose blood test strip [id number] No Check blood sugar three times daily Wanda Plump, MD  Taking Active Self  sacubitril-valsartan (ENTRESTO) 97-103 MG [id number]  Take 1 tablet by mouth 2 (two) times daily. Zigmund Daniel., MD  Active   spironolactone (ALDACTONE) 25 MG tablet [id number]  Take 1 tablet (25 mg total) by mouth daily. Zigmund Daniel., MD  Active             Home Care and Equipment/Supplies: Were Home Health Services Ordered?: NA Any new equipment or medical supplies ordered?: NA  Functional Questionnaire: Do you need assistance with bathing/showering or dressing?: No Do you need assistance with meal preparation?: No Do you need assistance with eating?: No Do you have difficulty maintaining continence: No Do you need assistance with getting out of bed/getting out of a chair/moving?: No Do you have difficulty managing or taking your medications?: No  Follow up appointments reviewed: PCP Follow-up appointment confirmed?: Yes Date of PCP follow-up appointment?: 02/12/23 Follow-up Provider: Gwinda Passe Specialist [facility] Follow-up appointment confirmed?: Yes Date of Specialist follow-up appointment?: 02/11/23 Follow-Up Specialty Provider:: Heart and vascular Do you need transportation to your follow-up appointment?: No Do you understand care options if your condition(s) worsen?: Yes-patient verbalized understanding    SIGNATURE Karena Addison, LPN [facility] Nurse Health Advisor Direct Dial [phone]

## 2023-02-06 LAB — MULTIPLE MYELOMA PANEL, SERUM
Albumin SerPl Elph-Mcnc: 3.4 g/dL (ref 2.9–4.4)
Albumin/Glob SerPl: 1 (ref 0.7–1.7)
Alpha 1: 0.3 g/dL (ref 0.0–0.4)
Alpha2 Glob SerPl Elph-Mcnc: 0.8 g/dL (ref 0.4–1.0)
B-Globulin SerPl Elph-Mcnc: 1.2 g/dL (ref 0.7–1.3)
Gamma Glob SerPl Elph-Mcnc: 1.2 g/dL (ref 0.4–1.8)
Globulin, Total: 3.5 g/dL (ref 2.2–3.9)
IgA: 301 mg/dL (ref 90–386)
IgG (Immunoglobin G), Serum: 1248 mg/dL (ref 603–1613)
IgM (Immunoglobulin M), Srm: 64 mg/dL (ref 20–172)
Total Protein ELP: 6.9 g/dL (ref 6.0–8.5)

## 2023-02-06 LAB — UIFE/LIGHT CHAINS/TP QN, 24-HR UR
FR KAPPA LT CH,24HR: 193.38 mg/24 hr
FR LAMBDA LT CH,24HR: 28.99 mg/24 hr
Free Kappa Lt Chains,Ur: 50.89 mg/L (ref 1.17–86.46)
Free Kappa/Lambda Ratio: 6.67 (ref 1.83–14.26)
Free Lambda Lt Chains,Ur: 7.63 mg/L (ref 0.27–15.21)
Total Protein, Urine-Ur/day: 1246 mg/24 hr — ABNORMAL HIGH (ref 30–150)
Total Protein, Urine: 32.8 mg/dL
Total Volume: 3800

## 2023-02-08 NOTE — Telephone Encounter (Addendum)
Advanced Heart Failure Patient Advocate Encounter  Sent in application via fax. Document scanned to chart.   Will follow up. 

## 2023-02-11 ENCOUNTER — Encounter (HOSPITAL_COMMUNITY): Payer: Self-pay

## 2023-02-11 ENCOUNTER — Ambulatory Visit (HOSPITAL_COMMUNITY)
Admit: 2023-02-11 | Discharge: 2023-02-11 | Disposition: A | Discharge status: Home / Self Care | Payer: Medicaid Other | Attending: Family Medicine | Admitting: Family Medicine

## 2023-02-11 ENCOUNTER — Other Ambulatory Visit (HOSPITAL_COMMUNITY): Payer: Self-pay

## 2023-02-11 VITALS — BP 108/74 | HR 82 | Wt 249.6 lb

## 2023-02-11 DIAGNOSIS — Z7984 Long term (current) use of oral hypoglycemic drugs: Secondary | ICD-10-CM | POA: Diagnosis not present

## 2023-02-11 DIAGNOSIS — Z6835 Body mass index (BMI) 35.0-35.9, adult: Secondary | ICD-10-CM | POA: Insufficient documentation

## 2023-02-11 DIAGNOSIS — E119 Type 2 diabetes mellitus without complications: Secondary | ICD-10-CM | POA: Diagnosis not present

## 2023-02-11 DIAGNOSIS — E785 Hyperlipidemia, unspecified: Secondary | ICD-10-CM | POA: Diagnosis not present

## 2023-02-11 DIAGNOSIS — N1831 Chronic kidney disease, stage 3a: Secondary | ICD-10-CM | POA: Insufficient documentation

## 2023-02-11 DIAGNOSIS — I130 Hypertensive heart and chronic kidney disease with heart failure and stage 1 through stage 4 chronic kidney disease, or unspecified chronic kidney disease: Secondary | ICD-10-CM | POA: Insufficient documentation

## 2023-02-11 DIAGNOSIS — Z8249 Family history of ischemic heart disease and other diseases of the circulatory system: Secondary | ICD-10-CM | POA: Diagnosis not present

## 2023-02-11 DIAGNOSIS — E1122 Type 2 diabetes mellitus with diabetic chronic kidney disease: Secondary | ICD-10-CM | POA: Insufficient documentation

## 2023-02-11 DIAGNOSIS — I5022 Chronic systolic (congestive) heart failure: Secondary | ICD-10-CM | POA: Insufficient documentation

## 2023-02-11 DIAGNOSIS — I10 Essential (primary) hypertension: Secondary | ICD-10-CM

## 2023-02-11 DIAGNOSIS — Z87891 Personal history of nicotine dependence: Secondary | ICD-10-CM | POA: Diagnosis not present

## 2023-02-11 DIAGNOSIS — E669 Obesity, unspecified: Secondary | ICD-10-CM | POA: Diagnosis not present

## 2023-02-11 DIAGNOSIS — Z79899 Other long term (current) drug therapy: Secondary | ICD-10-CM | POA: Diagnosis not present

## 2023-02-11 DIAGNOSIS — Z139 Encounter for screening, unspecified: Secondary | ICD-10-CM

## 2023-02-11 DIAGNOSIS — F1990 Other psychoactive substance use, unspecified, uncomplicated: Secondary | ICD-10-CM

## 2023-02-11 LAB — BASIC METABOLIC PANEL
Anion gap: 6 (ref 5–15)
BUN: 23 mg/dL — ABNORMAL HIGH (ref 6–20)
CO2: 25 mmol/L (ref 22–32)
Calcium: 9 mg/dL (ref 8.9–10.3)
Chloride: 104 mmol/L (ref 98–111)
Creatinine, Ser: 1.46 mg/dL — ABNORMAL HIGH (ref 0.61–1.24)
GFR, Estimated: 60 mL/min — ABNORMAL LOW (ref >60)
Glucose, Bld: 134 mg/dL — ABNORMAL HIGH (ref 70–99)
Potassium: 4.2 mmol/L (ref 3.5–5.1)
Sodium: 135 mmol/L (ref 135–145)

## 2023-02-11 LAB — BRAIN NATRIURETIC PEPTIDE: B Natriuretic Peptide: 206.7 pg/mL — ABNORMAL HIGH (ref 0.0–100.0)

## 2023-02-11 MED ORDER — SPIRONOLACTONE 25 MG PO TABS
25.0000 mg | ORAL_TABLET | Freq: Every day | ORAL | 11 refills | Status: DC
  Filled 2023-02-11: qty 30, 30d supply, fill #0

## 2023-02-11 MED ORDER — CARVEDILOL 3.125 MG PO TABS
3.1250 mg | ORAL_TABLET | Freq: Two times a day (BID) | ORAL | 11 refills | Status: DC
  Filled 2023-02-11: qty 60, 30d supply, fill #0

## 2023-02-11 MED ORDER — FUROSEMIDE 20 MG PO TABS
20.0000 mg | ORAL_TABLET | Freq: Every day | ORAL | 6 refills | Status: DC | PRN
  Filled 2023-02-11: qty 30, 30d supply, fill #0

## 2023-02-11 MED ORDER — ATORVASTATIN CALCIUM 80 MG PO TABS
80.0000 mg | ORAL_TABLET | Freq: Every day | ORAL | 11 refills | Status: DC
  Filled 2023-02-11: qty 30, 30d supply, fill #0

## 2023-02-11 NOTE — Progress Notes (Signed)
ADVANCED HF CLINIC CONSULT NOTE  Primary Care: Gwinda Passe, NP HF Cardiologist: Dr. Gala Romney  HPI: 47 y.o. male with history of DM II, HTN, hyperlipidemia, obesity and new diagnosis of systolic heart failure/NICM.   Hasn't not seen a provider in the system since 2021. Not taking any blood pressure or diabetes medications for several years.   Admitted 6/24 with new acute systolic heart failure. Echo showed EF 25-30%, severe LVH, RV ok. R/LHC showed no CAD, NICM EF 30%, RA 3, PA 28/11 (17), PCW 5, Fick CO/CI 5.5/2.3, PVR 2.2. cMRI showed LVEF 12%, suggestive of cardiac amyloidosis, RVEF 25%. PYP not suggestive of amyloidosis, grade 0, H/CL equal 1.09. GDMT titrated and he was discharged home, weight 247 lbs.    Today he returns for HF follow up with his wife. Overall feeling fine. He is not short of breath with activity, has been mowing his yard with his push mower. Denies palpitations, CP, dizziness, edema, or PND/Orthopnea. Appetite ok. No fever or chills. Weight at home 244-248 pounds. Taking all medications. He runs a Sales executive. Does not have insurance, wife says he was recently approved for Medicaid. No ETOH or tobacco use. Uses marijuana. Wife says he snores.   Mother passed with CHF, PPM and ESRD.No other relatives with CHF. No family history of SCD.   Cardiac Studies: - PYP (6/24) equivocal for cardiac amyloidosis, grade 0, H/CL equal 1.09  - L/RHC (6/24): No CAD, NICM EF 30%, RA 3, PA 28/11 (17), PCW 5, Fick CO/CI 5.5/2.3, PVR 2.2   - cMRI (6/24) LVEF 12% with suggestion of cardiac amyloidosis, RVEF 26%  - Echo (6/24): EF 25-30%, severe LVH, RV mildly down.   Review of Systems: [y] = yes, [ ]  = no   General: Weight gain [ ] ; Weight loss [ ] ; Anorexia [ ] ; Fatigue [ ] ; Fever [ ] ; Chills [ ] ; Weakness [ ]   Cardiac: Chest pain/pressure [ ] ; Resting SOB [ ] ; Exertional SOB [ ] ; Orthopnea [ ] ; Pedal Edema [ ] ; Palpitations [ ] ; Syncope [ ] ; Presyncope [ ] ; Paroxysmal  nocturnal dyspnea[ ]   Pulmonary: Cough [ ] ; Wheezing[ ] ; Hemoptysis[ ] ; Sputum [ ] ; Snoring [ ]   GI: Vomiting[ ] ; Dysphagia[ ] ; Melena[ ] ; Hematochezia [ ] ; Heartburn[ ] ; Abdominal pain [ ] ; Constipation [ ] ; Diarrhea [ ] ; BRBPR [ ]   GU: Hematuria[ ] ; Dysuria [ ] ; Nocturia[ ]   Vascular: Pain in legs with walking [ ] ; Pain in feet with lying flat [ ] ; Non-healing sores [ ] ; Stroke [ ] ; TIA [ ] ; Slurred speech [ ] ;  Neuro: Headaches[ ] ; Vertigo[ ] ; Seizures[ ] ; Paresthesias[ ] ;Blurred vision [ ] ; Diplopia [ ] ; Vision changes [ ]   Ortho/Skin: Arthritis [ ] ; Joint pain [ ] ; Muscle pain [ ] ; Joint swelling [ ] ; Back Pain [ ] ; Rash [ ]   Psych: Depression[ ] ; Anxiety[ ]   Heme: Bleeding problems [ ] ; Clotting disorders [ ] ; Anemia [ ]   Endocrine: Diabetes [ ] ; Thyroid dysfunction[ ]   Past Medical History:  Diagnosis Date   Diabetes (HCC) 01/05/2013   Elevated LFTs 01/05/2013   HTN (hypertension) 12/12/2012   Hyperlipidemia 12/04/2013   Obesity    Tinea pedis, onychomycosis 01/05/2013   Current Outpatient Medications  Medication Sig Dispense Refill   atorvastatin (LIPITOR) 80 MG tablet Take 1 tablet (80 mg total) by mouth daily. 30 tablet 1   blood glucose meter kit and supplies Dispense based on patient and insurance preference. Check blood sugar three times daily. DX:  E11.9 1 each 0   carvedilol (COREG) 3.125 MG tablet Take 1 tablet (3.125 mg total) by mouth 2 (two) times daily. 60 tablet 1   empagliflozin (JARDIANCE) 10 MG TABS tablet Take 1 tablet (10 mg total) by mouth daily. 30 tablet 1   furosemide (LASIX) 20 MG tablet Take 1 tablet (20 mg total) by mouth daily as needed (weight gain 3 lbs in 1 day or 5 lbs in 1 week). 30 tablet 1   glucose blood test strip Check blood sugar three times daily 300 each 12   sacubitril-valsartan (ENTRESTO) 97-103 MG Take 1 tablet by mouth 2 (two) times daily. 60 tablet 1   spironolactone (ALDACTONE) 25 MG tablet Take 1 tablet (25 mg total) by mouth daily. 30  tablet 1   No current facility-administered medications for this encounter.    No Known Allergies    Social History   Socioeconomic History   Marital status: Married    Spouse name: Not on file   Number of children: 5   Years of education: Not on file   Highest education level: Not on file  Occupational History   Occupation: yard work  Tobacco Use   Smoking status: Former    Types: Cigars    Quit date: 09/18/2000    Years since quitting: 22.4   Smokeless tobacco: Never   Tobacco comments:    smoke a few cigars off an on .   Substance and Sexual Activity   Alcohol use: Yes    Comment: socially   Drug use: No   Sexual activity: Not on file  Other Topics Concern   Not on file  Social History Narrative   Married , household: pt, wife, wife's son   Exercise: none   Diet: regular   Social Determinants of Health   Financial Resource Strain: Not on file  Food Insecurity: No Food Insecurity (01/28/2023)   Hunger Vital Sign    Worried About Running Out of Food in the Last Year: Never true    Ran Out of Food in the Last Year: Never true  Transportation Needs: No Transportation Needs (01/28/2023)   PRAPARE - Administrator, Civil Service (Medical): No    Lack of Transportation (Non-Medical): No  Physical Activity: Not on file  Stress: Not on file  Social Connections: Not on file  Intimate Partner Violence: Not At Risk (01/28/2023)   Humiliation, Afraid, Rape, and Kick questionnaire    Fear of Current or Ex-Partner: No    Emotionally Abused: No    Physically Abused: No    Sexually Abused: No      Family History  Problem Relation Age of Onset   CAD Mother 29       MI?   Diabetes Neg Hx    Stroke Neg Hx    Colon cancer Neg Hx    Prostate cancer Neg Hx    BP 108/74   Pulse 82   Wt 113.2 kg (249 lb 9.6 oz)   SpO2 98%   BMI 35.31 kg/m   Wt Readings from Last 3 Encounters:  02/11/23 113.2 kg (249 lb 9.6 oz)  02/01/23 112.4 kg (247 lb 12.8 oz)   02/05/20 134.1 kg (295 lb 9.6 oz)   PHYSICAL EXAM: General:  NAD. No resp difficulty, walked into clinic HEENT: Normal Neck: Supple. No JVD. Carotids 2+ bilat; no bruits. No lymphadenopathy or thryomegaly appreciated. Cor: PMI nondisplaced. Regular rate & rhythm. No rubs, gallops or murmurs. Lungs: Clear Abdomen:  Soft, nontender, nondistended. No hepatosplenomegaly. No bruits or masses. Good bowel sounds. Extremities: No cyanosis, clubbing, rash, edema Neuro: Alert & oriented x 3, cranial nerves grossly intact. Moves all 4 extremities w/o difficulty. Affect pleasant.  ECG (personally reviewed): NSR + LVH  ASSESSMENT & PLAN: 1. Chronic systolic heart failure - Bedside echo by Dr. Gala Romney (01/28/23):EF ~25% and moderate to severe RV dysfunction.  - Echo (01/29/23): EF 25-30%, LV with GHK, severe concentric LHV, mildly reduced RV, trivial MR/TR - R/LHC (6/24): No CAD, NICM EF 30%, RA 3, PA 28/11 (17), PCW 5, Fick CO/CI 5.5/2.3, PVR 2.2 - cMRI (6/24): LVEF 12% with suggestion of cardiac amyloidosis, RVEF 26%. Family history strongly suggestive of familial TTR cardiac amyloidosis.  - PYP (6/24) not suggestive of TTR cardiac amyloidosis, grade 0, H/CL equal 1.09 - SPEP negative - Arrange for genetic testing.  - Improved NYHA I-II, volume stable. - Continue Entresto 97/103 mg bid. Patient assistance - Continue spironolactone 25 mg daily. - Continue Jardiance 10 mg daily. Patient assistance - Continue Coreg 3.125 mg bid - Continue Lasix 20 PRN. - Labs today. - Repeat echo when GDMT optimized. - Discussed activity/exercise restrictions   2. HTN - Previously uncontrolled HTN - BP much improved - GDMT as above - Arrange sleep study once he has insurance   3. DM II - Has been off meds for at least a few years - A1c 6.6 - On SGLT2i - Per PCP   4. Obesity  - Body mass index is 35.31 kg/m. - Arrange sleep study when he has insurance - Would benefit from GLP-1 agonist to assist  with weight loss, defer to PCP   5. H/o AKI on ? CKD IIIa - Unknown baseline - likely HTN and cardiorenal - Labs today.   6. Hyperlipidemia - LDL 161 - Continue statin  7. Substance use - Smokes THC daily - Discussed cessation   8. SDOH - Uninsured. Medicaid-pending - Meds thru HF fund - he has new pt PCP appt tomorrow  Follow up with PharmD in 3-4 weeks for GDMT titration (increase Coreg), and 3 months with Dr. Gala Romney + echo.  Prince Rome, FNP-BC 02/11/23  Greater than 50% of the (total minutes 40) visit spent in counseling/coordination of care regarding ( heart failure, GDMT and cardiac amyloidosis)

## 2023-02-11 NOTE — Telephone Encounter (Signed)
Spoke with pt

## 2023-02-11 NOTE — Progress Notes (Signed)
Blood collected for TTR genetic testing per Dr Bensimhon.  Order form completed and both shipped by [employer] to [facility].  

## 2023-02-11 NOTE — Patient Instructions (Addendum)
There has been no changes to your medications.  Please call [employer] at [phone] and ask for the NO INCOME LETTER.   Labs done today, your results will be available in MyChart, we will contact you for abnormal readings.  Genetic test has been done, this has to be sent to [place] to be processed and can take 1-2 weeks to get results back.  We will let you know the results.  Your physician has requested that you have an echocardiogram. Echocardiography is a painless test that uses sound waves to create images of your heart. It provides your doctor with information about the size and shape of your heart and how well your heart's chambers and valves are working. This procedure takes approximately one hour. There are no restrictions for this procedure. Please do NOT wear cologne, perfume, aftershave, or lotions (deodorant is allowed). Please arrive 15 minutes prior to your appointment time.  Please follow up with our heart failure pharmacist in 3-4 weeks  Your physician recommends that you schedule a follow-up appointment in: 4 months with an echocardiogram ( October) ** please call the office in August to arrange your follow up appointment. **  If you have any questions or concerns before your next appointment please send [place] a message through [place] or call our office at [phone].    TO LEAVE A MESSAGE FOR THE NURSE SELECT OPTION 2, PLEASE LEAVE A MESSAGE INCLUDING: YOUR NAME DATE OF BIRTH CALL BACK NUMBER REASON FOR CALL**this is important as we prioritize the call backs  YOU WILL RECEIVE A CALL BACK THE SAME DAY AS LONG AS YOU CALL BEFORE 4:00 PM  At the Advanced Heart Failure Clinic, you and your health needs are our priority. As part of our continuing mission to provide you with exceptional heart care, we have created designated Provider Care Teams. These Care Teams include your primary Cardiologist (physician) and Advanced Practice Providers (APPs- Physician Assistants and  Nurse Practitioners) who all work together to provide you with the care you need, when you need it.   You may see any of the following providers on your designated Care Team at your next follow up: Dr Arvilla Meres Dr Marca Ancona Dr. Marcos Eke, NP Robbie Lis, [place] [facility] Ewa Gentry, [place] Brynda Peon, NP Karle Plumber, PharmD   Please be sure to bring in all your medications bottles to every appointment.    Thank you for choosing [facility]

## 2023-02-11 NOTE — Progress Notes (Signed)
Medication Samples have been provided to the patient.  Drug name: Sherryll Burger       Strength: 49/51 mg        Qty: 4 bottles  LOT: [id number]  Exp.Date: 06/25  Dosing instructions: Take 2 Tablets Twice daily   Medication Samples have been provided to the patient.  Drug name: Jardiance       Strength: 10 mg        Qty: 4 bottles  LOT: [id number]  Exp.Date: 03/26  Dosing instructions: Take 1 tablet daily  The patient has been instructed regarding the correct time, dose, and frequency of taking this medication, including desired effects and most common side effects.   Twala Collings Judie Petit Tennie Grussing 3:26 PM 02/11/2023   Th

## 2023-02-11 NOTE — Telephone Encounter (Signed)
Advanced Heart Failure Patient Advocate Encounter   Patient was approved to receive Jardiance from [facility]  Effective dates: 02/11/23 through 02/11/24  Document scanned to chart.   Archer Asa, CPhT

## 2023-02-11 NOTE — Telephone Encounter (Signed)
Advanced Heart Failure Patient Advocate Encounter  [employer] requests POI before continuing to process the patient's application. Information was relayed to patient while in clinic.

## 2023-02-12 ENCOUNTER — Ambulatory Visit (INDEPENDENT_AMBULATORY_CARE_PROVIDER_SITE_OTHER): Payer: Medicaid Other | Admitting: Primary Care

## 2023-02-12 ENCOUNTER — Encounter (INDEPENDENT_AMBULATORY_CARE_PROVIDER_SITE_OTHER): Payer: Self-pay | Admitting: Primary Care

## 2023-02-12 VITALS — BP 124/86 | HR 82 | Resp 16 | Ht 70.0 in | Wt 247.4 lb

## 2023-02-12 DIAGNOSIS — Z0000 Encounter for general adult medical examination without abnormal findings: Secondary | ICD-10-CM

## 2023-02-12 DIAGNOSIS — Z7689 Persons encountering health services in other specified circumstances: Secondary | ICD-10-CM

## 2023-02-12 DIAGNOSIS — Z7984 Long term (current) use of oral hypoglycemic drugs: Secondary | ICD-10-CM | POA: Diagnosis not present

## 2023-02-12 DIAGNOSIS — Z09 Encounter for follow-up examination after completed treatment for conditions other than malignant neoplasm: Secondary | ICD-10-CM | POA: Diagnosis not present

## 2023-02-12 NOTE — Progress Notes (Signed)
  [facility]   Subjective:   Michael Willis is a 47 y.o. male presents for hospital follow up and establish care. Admit date to the hospital was 01/28/23, patient was discharged from the hospital on 02/01/23, patient was admitted for: Chronic systolic heart failure. Hospital f/u completed by cardiology. Patient has No headache, No chest pain, No abdominal pain - No Nausea, No new weakness tingling or numbness, No Cough - shortness of breath   Past Medical History:  Diagnosis Date   Diabetes (HCC) 01/05/2013   Elevated LFTs 01/05/2013   HTN (hypertension) 12/12/2012   Hyperlipidemia 12/04/2013   Obesity    Tinea pedis, onychomycosis 01/05/2013     No Known Allergies    Current Outpatient Medications on File Prior to Visit  Medication Sig Dispense Refill   atorvastatin (LIPITOR) 80 MG tablet Take 1 tablet (80 mg total) by mouth daily. 30 tablet 11   blood glucose meter kit and supplies Dispense based on patient and insurance preference. Check blood sugar three times daily. DX: E11.9 1 each 0   carvedilol (COREG) 3.125 MG tablet Take 1 tablet (3.125 mg total) by mouth 2 (two) times daily. 60 tablet 11   empagliflozin (JARDIANCE) 10 MG TABS tablet Take 1 tablet (10 mg total) by mouth daily. 30 tablet 1   furosemide (LASIX) 20 MG tablet Take 1 tablet (20 mg total) by mouth daily as needed (weight gain 3 lbs in 1 day or 5 lbs in 1 week). 30 tablet 6   glucose blood test strip Check blood sugar three times daily 300 each 12   sacubitril-valsartan (ENTRESTO) 97-103 MG Take 1 tablet by mouth 2 (two) times daily. 60 tablet 1   spironolactone (ALDACTONE) 25 MG tablet Take 1 tablet (25 mg total) by mouth daily. 30 tablet 11   No current facility-administered medications on file prior to visit.     Review of System: ROS  Objective:  Blood Pressure 124/86   Pulse 82   Respiration 16   Height 5\' 10"  (1.778 m)   Weight 247 lb 6.4 oz (112.2 kg)   Oxygen Saturation 96%   Body Mass  Index 35.50 kg/m   Filed Weights   02/12/23 0952  Weight: 247 lb 6.4 oz (112.2 kg)    Physical Exam: Not completed  Assessment:  Michael Willis was seen today for hospitalization follow-up and establish care.  Diagnoses and all orders for this visit:  Encounter to establish care   This note has been created with Education officer, environmental. Any transcriptional errors are unintentional.   Grayce Sessions, NP 02/12/2023, 11:32 AM

## 2023-02-18 ENCOUNTER — Other Ambulatory Visit (HOSPITAL_COMMUNITY): Payer: Self-pay

## 2023-02-18 NOTE — Progress Notes (Signed)
Advanced Heart Failure Clinic Note   Primary Care: Gwinda Passe, NP HF Cardiologist: Dr. Gala Romney  HPI:  47 y.o. male with history of DM II, HTN, hyperlipidemia, obesity and new diagnosis of systolic heart failure/NICM.   Hasn't seen a provider in the system since 2021. Not taking any blood pressure or diabetes medications for several years.   Admitted 01/2023 with new acute systolic heart failure. Echo showed EF 25-30%, severe LVH, RV ok. R/LHC showed no CAD, NICM EF 30%, RA 3, PA 28/11 (17), PCW 5, Fick CO/CI 5.5/2.3, PVR 2.2. cMRI showed LVEF 12%, suggestive of cardiac amyloidosis, RVEF 25%. PYP not suggestive of amyloidosis, grade 0, H/CL equal 1.09. GDMT titrated and he was discharged home, weight 247 lbs.    Presented to [facility] for HF follow up with his wife 02/11/23. Overall was feeling fine. He was not short of breath with activity, had been mowing his yard with his push mower. Denied palpitations, CP, dizziness, edema, or PND/Orthopnea. Appetite was ok. No fever or chills. Weight at home was 244-248 pounds. Reported taking all medications. 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  No ETOH or tobacco use. Uses marijuana. Wife says he snores.   Mother passed with CHF, PPM and ESRD. No other relatives with CHF. No family history of SCD.  Today he returns to HF clinic for pharmacist medication titration. At last visit with APP, no medication changes were made. Overall he is feeling well today. No dizziness, lightheadedness, CP or palpitations. No SOB/DOE. Weight has been increasing at home, now ~255-257. Weight is up ~10 lbs from last clinic visit. He attributes this to dietary indiscretion while on his cruise. Says he also drinks a lot of water. No LEE, PND or orthopnea. No abdominal bloating. He has only used one dose of PRN Lasix, and that was last week while on the cruise "just in case". Taking all medications as prescribed and tolerating all  medications. Applied for Medicaid but not approved yet. [employer] PAP for Entresto still pending POI.    HF Medications: Carvedilol 3.125 mg BID Entresto 97/103 mg BID Spironolactone 25 mg daily Jardiance 10 mg daily Lasix 20 mg PRN  Has the patient been experiencing any side effects to the medications prescribed?  no  Does the patient have any problems obtaining medications due to transportation or finances?   No insurance. Medicaid is pending. Approved for [employer] for Jardiance. [employer] patient assistance is pending. Patient states his wife called [employer] and they wanted additional information (assume "no income Research scientist (medical)"). He says he thought she provided this information to them 2 days ago. He just started using samples provided in clinic today (had a bottle previously from his 30 day free card).   Understanding of regimen: fair Understanding of indications: fair Potential of compliance: good Patient understands to avoid NSAIDs. Patient understands to avoid decongestants.    Pertinent Lab Values: 02/11/23: Serum creatinine 1.46, BUN 23, Potassium 4.2, Sodium 135  Vital Signs: Weight: 261.8 lbs (last clinic weight: 249.6 lbs) Blood pressure: 144/96  Heart rate: 80   Assessment/Plan: 1. Chronic systolic heart failure - Bedside echo by Dr. Gala Romney (01/28/23):EF ~25% and moderate to severe RV dysfunction.  - Echo (01/29/23): EF 25-30%, LV with GHK, severe concentric LHV, mildly reduced RV, trivial MR/TR - R/LHC (01/2023): No CAD, NICM EF 30%, RA 3, PA 28/11 (17), PCW 5, Fick CO/CI 5.5/2.3, PVR 2.2 - cMRI (01/2023): LVEF 12% with suggestion of  cardiac amyloidosis, RVEF 26%. Family history strongly suggestive of familial TTR cardiac amyloidosis.  - PYP (01/2023) not suggestive of TTR cardiac amyloidosis, grade 0, H/CL equal 1.09 - SPEP negative - [facility] genetic testing negative - Improved NYHA I-II. Weight up 10 lbs from last visit, but patient did note dietary  indiscretions on recent cruise. No LEE, PND or orthopnea. No other signs of extra fluid other than weight gain.  -Take Lasix 20 mg x2 days as extra fluid may be contributing to weight gain, then continue Lasix 20 mg PRN. - Increase carvedilol to 6.25 mg BID. Instructed to start higher dose of carvedilol on Saturday as this is a few days after taking PRN Lasix.  - Continue Entresto 97/103 mg BID. Patient assistance pending. Was given one month of samples last visit that he just started today.  - Continue spironolactone 25 mg daily. - Continue Jardiance 10 mg daily. Approved for patient assistance.  - Repeat echo when GDMT optimized. - Discussed activity/exercise restrictions   2. HTN - Previously uncontrolled HTN - BP elevated at 144/96 today - Increase carvedilol as above.  - Arrange sleep study once he has insurance   3. DM II - Has been off meds for at least a few years - A1c 6.6 - On SGLT2i - Per PCP   4. Obesity  - Body mass index is 35.31 kg/m. - Arrange sleep study when he has insurance - Would benefit from GLP-1 agonist to assist with weight loss, defer to PCP   5. H/o AKI on ? CKD IIIa - Unknown baseline - likely HTN and cardiorenal - Scr 1.46 on BMET 02/11/23   6. Hyperlipidemia - LDL 161 - Continue statin   7. Substance use - Smokes THC daily - Discussed cessation   8. SDOH - Uninsured. Medicaid-pending - Has now established with PCP - Gwinda Passe, NP  Follow up 4 weeks with Pharmacy Clinic.    Karle Plumber, PharmD, BCPS, BCCP, CPP Heart Failure Clinic Pharmacist [phone]

## 2023-03-04 ENCOUNTER — Telehealth: Payer: Self-pay

## 2023-03-04 ENCOUNTER — Other Ambulatory Visit (HOSPITAL_COMMUNITY): Payer: Self-pay | Admitting: Cardiology

## 2023-03-04 MED ORDER — CARVEDILOL 3.125 MG PO TABS: 3.1250 mg | ORAL_TABLET | Freq: Two times a day (BID) | ORAL | 2 refills | Status: DC

## 2023-03-04 MED ORDER — FUROSEMIDE 20 MG PO TABS: 20.0000 mg | ORAL_TABLET | Freq: Every day | ORAL | 2 refills | Status: DC | PRN

## 2023-03-04 MED ORDER — SPIRONOLACTONE 25 MG PO TABS: 25.0000 mg | ORAL_TABLET | Freq: Every day | ORAL | 2 refills | Status: DC

## 2023-03-04 MED ORDER — ATORVASTATIN CALCIUM 80 MG PO TABS: 80.0000 mg | ORAL_TABLET | Freq: Every day | ORAL | 2 refills | Status: DC

## 2023-03-04 NOTE — Telephone Encounter (Signed)
Pt called stating that he would like to re-establish care with Dr. Drue Novel.

## 2023-03-04 NOTE — Telephone Encounter (Signed)
That is okay, thank you 

## 2023-03-06 ENCOUNTER — Ambulatory Visit (HOSPITAL_COMMUNITY)
Admission: RE | Admit: 2023-03-06 | Discharge: 2023-03-06 | Disposition: A | Discharge status: Home / Self Care | Payer: Medicaid Other | Source: Ambulatory Visit | Attending: Cardiology | Admitting: Cardiology

## 2023-03-06 VITALS — BP 144/96 | HR 80 | Wt 261.8 lb

## 2023-03-06 DIAGNOSIS — E785 Hyperlipidemia, unspecified: Secondary | ICD-10-CM | POA: Insufficient documentation

## 2023-03-06 DIAGNOSIS — E1122 Type 2 diabetes mellitus with diabetic chronic kidney disease: Secondary | ICD-10-CM | POA: Insufficient documentation

## 2023-03-06 DIAGNOSIS — E669 Obesity, unspecified: Secondary | ICD-10-CM | POA: Insufficient documentation

## 2023-03-06 DIAGNOSIS — I5020 Unspecified systolic (congestive) heart failure: Secondary | ICD-10-CM | POA: Insufficient documentation

## 2023-03-06 DIAGNOSIS — I130 Hypertensive heart and chronic kidney disease with heart failure and stage 1 through stage 4 chronic kidney disease, or unspecified chronic kidney disease: Secondary | ICD-10-CM | POA: Diagnosis present

## 2023-03-06 DIAGNOSIS — N1831 Chronic kidney disease, stage 3a: Secondary | ICD-10-CM | POA: Insufficient documentation

## 2023-03-06 DIAGNOSIS — Z6835 Body mass index (BMI) 35.0-35.9, adult: Secondary | ICD-10-CM | POA: Diagnosis not present

## 2023-03-06 DIAGNOSIS — F17200 Nicotine dependence, unspecified, uncomplicated: Secondary | ICD-10-CM | POA: Diagnosis not present

## 2023-03-06 DIAGNOSIS — I5022 Chronic systolic (congestive) heart failure: Secondary | ICD-10-CM

## 2023-03-06 MED ORDER — CARVEDILOL 6.25 MG PO TABS: 6.2500 mg | ORAL_TABLET | Freq: Two times a day (BID) | ORAL | 5 refills | Status: DC

## 2023-03-06 NOTE — Telephone Encounter (Signed)
Advanced Heart Failure Patient Advocate Encounter  Called and spoke with the patient's wife. She stated they have yet to receive the attestation letter for her to be able to send back. [employer] via conference call. They are going to fax over the attestation letter for the patient to sign.   Will fax in once signature is obtained.

## 2023-03-06 NOTE — Telephone Encounter (Addendum)
Advanced Heart Failure Patient Advocate Encounter  Called [employer] to check the status of the patient's application. Patient stated his wife emailed the information requested. Representative stated that they have yet to receive anything from the patient since the attestation letter was mailed. They do not use email, suggested that the paperwork be sent over again.  Will ask patient's wife what would be easier. She can always email it to me and I can send on her behalf. Will follow up.

## 2023-03-06 NOTE — Patient Instructions (Addendum)
It was a pleasure seeing you today!  MEDICATIONS: -We are changing your medications today -Take a dose of Lasix 20 mg (1 tablet) for 1-2 days for extra fluid then resume taking only as needed for extra fluid. -Increase carvedilol to 6.25 mg (1 tablet) twice daily. You may take 2 tablets of the carvedilol 3.125 mg strength twice daily until you pick up the new strength.  -Call if you have questions about your medications.   NEXT APPOINTMENT: Return to clinic in 4 weeks with Pharmacy Clinic.  In general, to take care of your heart failure: -Limit your fluid intake to 2 Liters (half-gallon) per day.   -Limit your salt intake to ideally 2-3 grams (2000-3000 mg) per day. -Weigh yourself daily and record, and bring that "weight diary" to your next appointment.  (Weight gain of 2-3 pounds in 1 day typically means fluid weight.) -The medications for your heart are to help your heart and help you live longer.   -Please contact us before stopping any of your heart medications.  Call the clinic at [phone] with questions or to reschedule future appointments.

## 2023-03-08 NOTE — Telephone Encounter (Signed)
Advanced Heart Failure Patient Advocate Encounter  Attestation letter sent in via fax.

## 2023-03-11 ENCOUNTER — Other Ambulatory Visit (HOSPITAL_COMMUNITY): Payer: Self-pay | Admitting: Internal Medicine

## 2023-03-12 NOTE — Telephone Encounter (Signed)
Advanced Heart Failure Patient Advocate Encounter   Patient was approved to receive Entresto from [employer]  Effective dates: 03/11/23 through 03/10/24  Document scanned to chart. Called and spoke with the patient's.  Archer Asa, CPhT

## 2023-03-25 ENCOUNTER — Other Ambulatory Visit (HOSPITAL_COMMUNITY): Payer: Self-pay

## 2023-03-29 NOTE — Progress Notes (Incomplete)
***In Progress***    Advanced Heart Failure Clinic Note   HPI:  Primary Care: Gwinda Passe, NP HF Cardiologist: Dr. Gala Romney   HPI:  47 y.o. male with history of DM II, HTN, hyperlipidemia, obesity and new diagnosis of systolic heart failure/NICM.   Hasn't seen a provider in the system since 2021. Not taking any blood pressure or diabetes medications for several years.   Admitted 01/2023 with new acute systolic heart failure. Echo showed EF 25-30%, severe LVH, RV ok. R/LHC showed no CAD, NICM EF 30%, RA 3, PA 28/11 (17), PCW 5, Fick CO/CI 5.5/2.3, PVR 2.2. cMRI showed LVEF 12%, suggestive of cardiac amyloidosis, RVEF 25%. PYP not suggestive of amyloidosis, grade 0, H/CL equal 1.09. GDMT titrated and he was discharged home, weight 247 lbs.    Presented to [facility] for HF follow up with his wife 02/11/23. Overall was feeling fine. He was not short of breath with activity, had been mowing his yard with his push mower. Denied palpitations, CP, dizziness, edema, or PND/Orthopnea. Appetite was ok. No fever or chills. Weight at home was 244-248 pounds. Reported taking all medications. 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  No ETOH or tobacco use. Uses marijuana. Wife says he snores.   Mother passed with CHF, PPM and ESRD. No other relatives with CHF. No family history of SCD.  He felt is well at last visit. No dizziness, lightheadedness, CP or palpitations. No SOB/DOE. Weight has been increasing at home, now ~255-257. Weight is up ~10 lbs from last clinic visit. He attributes this to dietary indiscretion while on his cruise. Says he also drinks a lot of water. No LEE, PND or orthopnea. No abdominal bloating. He has only used one dose of PRN Lasix, and that was last week while on the cruise "just in case". Taking all medications as prescribed and tolerating all medications. Applied for Medicaid but not approved yet. [employer] PAP for Entresto still pending POI.    Today he returns to HF clinic for pharmacist medication titration. At last visit with APP his carvedilol was increased to 6.25 mg bid.Today he reports feeling   Overall feeling ***. Dizziness, lightheadedness, fatigue:  Chest pain or palpitations:  How is your breathing?: *** SOB: Able to complete all ADLs. Activity level ***  Weight at home pounds. Takes furosemide/torsemide/bumex *** mg *** daily.  LEE PND/Orthopnea  Appetite *** Low-salt diet:   Physical Exam Cost/affordability of meds   HF Medications:  carvedilol 6.25 mg BID Entresto 97/103 mg BID spironolactone 25 mg daily Jardiance 10 mg daily Lasix 20 mg PRN  Has the patient been experiencing any side effects to the medications prescribed?  {YES NO:22349}  Does the patient have any problems obtaining medications due to transportation or finances?   Yes/ No  Understanding of regimen: {excellent/good/fair/poor:19665} Understanding of indications: {excellent/good/fair/poor:19665} Potential of compliance: {excellent/good/fair/poor:19665} Patient understands to avoid NSAIDs. Patient understands to avoid decongestants.    Pertinent Lab Values: *update if get values* Serum creatinine 1.46, BUN 23, Potassium 4.2, BNP 206.7 (Labs from 02/11/23) Vital Signs: Weight: *** (last clinic weight: 261.8) Blood pressure: ***  Heart rate: ***   Assessment/Plan: *update* 1. Chronic systolic CHF (EF 40-98%), due to nonischemic cardiomyopathy. NYHA class I-II symptoms. - Bedside echo by Dr. Gala Romney (01/28/23):EF ~25% and moderate to severe RV dysfunction.  - Echo (01/29/23): EF 25-30%, LV with GHK, severe concentric LHV, mildly reduced RV, trivial MR/TR - R/LHC (01/2023): No CAD, NICM EF 30%, RA  3, PA 28/11 (17), PCW 5, Fick CO/CI 5.5/2.3, PVR 2.2 - cMRI (01/2023): LVEF 12% with suggestion of cardiac amyloidosis, RVEF 26%. Family history strongly suggestive of familial TTR cardiac amyloidosis.  - PYP (01/2023) not suggestive of  TTR cardiac amyloidosis, grade 0, H/CL equal 1.09 - SPEP negative - [employer] genetic testing negative - Improved NYHA I-II. Weight up 10 lbs from last visit, but patient did note dietary indiscretions on recent cruise. No LEE, PND or orthopnea. No other signs of extra fluid other than weight gain.  -Take Lasix 20 mg x2 days as extra fluid may be contributing to weight gain, then continue Lasix 20 mg PRN. - Increase carvedilol to 6.25 mg BID. Instructed to start higher dose of carvedilol on Saturday as this is a few days after taking PRN Lasix.  - Continue Entresto 97/103 mg BID. Patient assistance pending. Was given one month of samples last visit that he just started today.  - Continue spironolactone 25 mg daily. - Continue Jardiance 10 mg daily. Approved for patient assistance.  - Repeat echo when GDMT optimized. - Discussed activity/exercise restrictions   2. HTN - Previously uncontrolled HTN - BP elevated at 144/96 today - Increase carvedilol as above.  - Arrange sleep study once he has insurance   3. DM II - Has been off meds for at least a few years - A1c 6.6 - On SGLT2i - Per PCP   4. Obesity  - Body mass index is 35.31 kg/m. - Arrange sleep study when he has insurance - Would benefit from GLP-1 agonist to assist with weight loss, defer to PCP   5. H/o AKI on ? CKD IIIa - Unknown baseline - likely HTN and cardiorenal - Scr 1.46 on BMET 02/11/23   6. Hyperlipidemia - LDL 161 - Continue statin   7. Substance use - Smokes THC daily - Discussed cessation   8. SDOH - Uninsured. Medicaid-pending - Has now established with PCP- Gwinda Passe, NP    Follow up ***   Karle Plumber, PharmD, BCPS, BCCP, CPP Heart Failure Clinic Pharmacist [phone]

## 2023-04-01 ENCOUNTER — Other Ambulatory Visit (HOSPITAL_COMMUNITY): Payer: Self-pay

## 2023-04-01 ENCOUNTER — Ambulatory Visit (HOSPITAL_COMMUNITY)
Admission: RE | Admit: 2023-04-01 | Discharge: 2023-04-01 | Disposition: A | Discharge status: Home / Self Care | Payer: Medicaid Other | Source: Ambulatory Visit | Attending: Internal Medicine | Admitting: Internal Medicine

## 2023-04-01 VITALS — BP 144/84 | HR 64 | Wt 263.4 lb

## 2023-04-01 DIAGNOSIS — N2889 Other specified disorders of kidney and ureter: Secondary | ICD-10-CM | POA: Insufficient documentation

## 2023-04-01 DIAGNOSIS — E785 Hyperlipidemia, unspecified: Secondary | ICD-10-CM | POA: Diagnosis not present

## 2023-04-01 DIAGNOSIS — Z6835 Body mass index (BMI) 35.0-35.9, adult: Secondary | ICD-10-CM | POA: Diagnosis not present

## 2023-04-01 DIAGNOSIS — E669 Obesity, unspecified: Secondary | ICD-10-CM | POA: Diagnosis not present

## 2023-04-01 DIAGNOSIS — F1290 Cannabis use, unspecified, uncomplicated: Secondary | ICD-10-CM | POA: Diagnosis not present

## 2023-04-01 DIAGNOSIS — I5022 Chronic systolic (congestive) heart failure: Secondary | ICD-10-CM | POA: Diagnosis present

## 2023-04-01 DIAGNOSIS — E119 Type 2 diabetes mellitus without complications: Secondary | ICD-10-CM | POA: Diagnosis present

## 2023-04-01 DIAGNOSIS — I110 Hypertensive heart disease with heart failure: Secondary | ICD-10-CM | POA: Insufficient documentation

## 2023-04-01 NOTE — Progress Notes (Signed)
Advanced Heart Failure Clinic Note   HPI:  Primary Care: Gwinda Passe, NP HF Cardiologist: Dr. Gala Romney   HPI:  47 y.o. male with history of DM II, HTN, hyperlipidemia, obesity and new diagnosis of systolic heart failure/NICM.   Hasn't seen a provider in the system since 2021. Not taking any blood pressure or diabetes medications for several years.   Admitted 01/2023 with new acute systolic heart failure. Echo showed EF 25-30%, severe LVH, RV ok. R/LHC showed no CAD, NICM EF 30%, RA 3, PA 28/11 (17), PCW 5, Fick CO/CI 5.5/2.3, PVR 2.2. cMRI showed LVEF 12%, suggestive of cardiac amyloidosis, RVEF 25%. PYP not suggestive of amyloidosis, grade 0, H/CL equal 1.09. GDMT titrated and he was discharged home, weight 247 lbs.    Presented to [facility] for HF follow up with his wife 02/11/23. Overall was feeling fine. He was not short of breath with activity, had been mowing his yard with his push mower. Denied palpitations, CP, dizziness, edema, or PND/Orthopnea. Appetite was ok. No fever or chills. Weight at home was 244-248 pounds. Reported taking all medications. 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  No ETOH or tobacco use. Uses marijuana. Wife says he snores.   Mother passed with CHF, PPM and ESRD. No other relatives with CHF. No family history of SCD.  At last visit with Pharmacy clinic on 03/06/23 he felt well. No dizziness, lightheadedness, CP or palpitations. No SOB/DOE. Weight increased at home, was ~255-257. Weight was up ~10 lbs from last clinic visit. He attributed this to dietary indiscretion while on his cruise. Stated he also drinks a lot of water. No LEE, PND or orthopnea. No abdominal bloating. He used one dose of PRN Lasix, and that was the previous week while on the cruise "just in case". Reported taking all medications as prescribed and tolerating all medications. Applied for Medicaid but was not approved yet. [employer] PAP for Sherryll Burger was  still pending POI.   Today he returns to HF clinic for pharmacist medication titration. At last visit with Pharmacy clinic his carvedilol was increased to 6.25 mg BID.Today he reports feeling well. His BP was elevated today in clinic but he didn't take his meds before the visit. Does not check BP at home. No changes in how he is feeling from last visit to this visit. Denies dizziness, lightheadedness, fatigue, chest pain or palpitations. No SOB/ DOE.  Weight at home is stable around 255-258 lbs. He takes Lasix 20 mg prn but has not needed any. No LEE. No PND/ orthopnea. Appetite has been consistent. He has been approved for Medicaid but he is in the process of providing extra documentation to Medicaid so claims can be processed.    HF Medications: carvedilol 6.25 mg BID Entresto 97/103 mg BID spironolactone 25 mg daily Jardiance 10 mg daily Lasix 20 mg PRN  Has the patient been experiencing any side effects to the medications prescribed?  No  Does the patient have any problems obtaining medications due to transportation or finances?   Yes  Medicaid approved but claims are rejecting. Rejections state that claims must be submitted to primary payor, but he does not have any other insurance. He is in process of speaking with Medicaid to get this overturned.  Pt is getting Entresto and Jardiance from the manufacturer's assistance while Medicaid documentation is being provided.  Understanding of regimen: good Understanding of indications: good Potential of compliance: good Patient understands to avoid NSAIDs. Patient  understands to avoid decongestants.     Pertinent Lab Values: 02/11/23: Serum creatinine 1.46, BUN 23, Potassium 4.2, Sodium 135,  BNP 206.7  Vital Signs: Weight: 263.4 lbs (last clinic weight: 261.8 lbs) Blood pressure: 144/84  Heart rate: 64    Assessment/Plan:  1. Chronic systolic heart failure  - Bedside echo by Dr. Gala Romney (01/28/23):EF ~25% and moderate to severe RV  dysfunction.  - Echo (01/29/23): EF 25-30%, LV with GHK, severe concentric LHV, mildly reduced RV, trivial MR/TR - R/LHC (01/2023): No CAD, NICM EF 30%, RA 3, PA 28/11 (17), PCW 5, Fick CO/CI 5.5/2.3, PVR 2.2 - cMRI (01/2023): LVEF 12% with suggestion of cardiac amyloidosis, RVEF 26%. Family history strongly suggestive of familial TTR cardiac amyloidosis.  - PYP (01/2023) not suggestive of TTR cardiac amyloidosis, grade 0, H/CL equal 1.09 - SPEP negative - [employer] genetic testing negative - Improved NYHA I-II. Euvolemic on exam.  - Continue carvedilol 6.25 mg BID. HR 64 before taking medications, no room to uptitrate today.  - Continue Entresto 97/103 mg BID. Patient assistance approved. - Continue spironolactone 25 mg daily. - Continue Jardiance 10 mg daily. Approved for patient assistance.  - Repeat echo on 05/23/23   2. HTN - Previously uncontrolled HTN - BP elevated at 144/84 today, did not take meds prior to visit. Will continue current regimen   3. DM II - Has been off meds for at least a few years - A1c 6.6 - On SGLT2i - Per PCP   4. Obesity  - Body mass index is 35.31 kg/m. - Arrange sleep study when he has insurance - Would benefit from GLP-1 agonist to assist with weight loss, defer to PCP   5. H/o AKI on ? CKD IIIa - Unknown baseline - likely HTN and cardiorenal - Scr 1.46 on BMET 02/11/23   6. Hyperlipidemia - LDL 161 - Continue statin   7. Substance use - Smokes THC daily - Discussed cessation   8. SDOH - Medicaid approved but still working through the process of providing extra documentation to their office as medication claims are not processing (see above for more details).  - Has now established with PCP- Gwinda Passe, NP    Follow up with Dr. Gala Romney and ECHO 05/23/23  Karle Plumber, PharmD, BCPS, BCCP, CPP Heart Failure Clinic Pharmacist [phone]

## 2023-04-01 NOTE — Patient Instructions (Signed)
It was a pleasure seeing you today!  MEDICATIONS: -No medication changes today -Call if you have questions about your medications.   NEXT APPOINTMENT: Return to clinic in 1-2 months with Dr. Gala Romney.  In general, to take care of your heart failure: -Limit your fluid intake to 2 Liters (half-gallon) per day.   -Limit your salt intake to ideally 2-3 grams (2000-3000 mg) per day. -Weigh yourself daily and record, and bring that "weight diary" to your next appointment.  (Weight gain of 2-3 pounds in 1 day typically means fluid weight.) -The medications for your heart are to help your heart and help you live longer.   -Please contact us before stopping any of your heart medications.  Call the clinic at [phone] with questions or to reschedule future appointments.

## 2023-04-03 ENCOUNTER — Ambulatory Visit: Payer: Medicaid Other | Admitting: Internal Medicine

## 2023-04-03 ENCOUNTER — Ambulatory Visit (HOSPITAL_BASED_OUTPATIENT_CLINIC_OR_DEPARTMENT_OTHER)
Admission: RE | Admit: 2023-04-03 | Discharge: 2023-04-03 | Disposition: A | Discharge status: Home / Self Care | Payer: Medicaid Other | Source: Ambulatory Visit | Attending: Internal Medicine | Admitting: Internal Medicine

## 2023-04-03 ENCOUNTER — Encounter: Payer: Self-pay | Admitting: Internal Medicine

## 2023-04-03 VITALS — BP 148/100 | HR 80 | Temp 97.8°F | Resp 18 | Ht 70.0 in | Wt 263.2 lb

## 2023-04-03 DIAGNOSIS — E785 Hyperlipidemia, unspecified: Secondary | ICD-10-CM | POA: Diagnosis not present

## 2023-04-03 DIAGNOSIS — Z7984 Long term (current) use of oral hypoglycemic drugs: Secondary | ICD-10-CM | POA: Diagnosis not present

## 2023-04-03 DIAGNOSIS — N179 Acute kidney failure, unspecified: Secondary | ICD-10-CM | POA: Diagnosis not present

## 2023-04-03 DIAGNOSIS — I10 Essential (primary) hypertension: Secondary | ICD-10-CM | POA: Diagnosis not present

## 2023-04-03 DIAGNOSIS — R9389 Abnormal findings on diagnostic imaging of other specified body structures: Secondary | ICD-10-CM

## 2023-04-03 DIAGNOSIS — I5022 Chronic systolic (congestive) heart failure: Secondary | ICD-10-CM

## 2023-04-03 DIAGNOSIS — E119 Type 2 diabetes mellitus without complications: Secondary | ICD-10-CM | POA: Diagnosis not present

## 2023-04-03 LAB — URINALYSIS, ROUTINE W REFLEX MICROSCOPIC
Bilirubin Urine: NEGATIVE
Hgb urine dipstick: NEGATIVE
Ketones, ur: NEGATIVE
Leukocytes,Ua: NEGATIVE
Nitrite: NEGATIVE
Specific Gravity, Urine: 1.01 (ref 1.000–1.030)
Total Protein, Urine: NEGATIVE
Urine Glucose: >=1000 — AB
Urobilinogen, UA: 0.2 (ref 0.0–1.0)
pH: 6 (ref 5.0–8.0)

## 2023-04-03 LAB — COMPREHENSIVE METABOLIC PANEL
ALT: 35 U/L (ref 0–53)
AST: 21 U/L (ref 0–37)
Albumin: 4.4 g/dL (ref 3.5–5.2)
Alkaline Phosphatase: 61 U/L (ref 39–117)
BUN: 20 mg/dL (ref 6–23)
CO2: 29 mEq/L (ref 19–32)
Calcium: 10 mg/dL (ref 8.4–10.5)
Chloride: 101 mEq/L (ref 96–112)
Creatinine, Ser: 1.29 mg/dL (ref 0.40–1.50)
GFR: 66.55 mL/min (ref >60.00)
Glucose, Bld: 212 mg/dL — ABNORMAL HIGH (ref 70–99)
Potassium: 4.1 mEq/L (ref 3.5–5.1)
Sodium: 137 mEq/L (ref 135–145)
Total Bilirubin: 0.7 mg/dL (ref 0.2–1.2)
Total Protein: 7.6 g/dL (ref 6.0–8.3)

## 2023-04-03 LAB — MICROALBUMIN / CREATININE URINE RATIO
Creatinine,U: 50.9 mg/dL
Microalb Creat Ratio: 15.9 mg/g (ref 0.0–30.0)
Microalb, Ur: 8.1 mg/dL — ABNORMAL HIGH (ref 0.0–1.9)

## 2023-04-03 LAB — LIPID PANEL
Cholesterol: 184 mg/dL (ref 0–200)
HDL: 49.7 mg/dL (ref >39.00)
LDL Cholesterol: 112 mg/dL — ABNORMAL HIGH (ref 0–99)
NonHDL: 134.35
Total CHOL/HDL Ratio: 4
Triglycerides: 112 mg/dL (ref 0.0–149.0)
VLDL: 22.4 mg/dL (ref 0.0–40.0)

## 2023-04-03 MED ORDER — BLOOD GLUCOSE METER KIT
PACK | 0 refills | Status: DC
Start: 2023-04-03 — End: 2023-04-03

## 2023-04-03 MED ORDER — CARVEDILOL 12.5 MG PO TABS
12.5000 mg | ORAL_TABLET | Freq: Two times a day (BID) | ORAL | 3 refills | Status: AC
Start: 2023-04-03 — End: ?

## 2023-04-03 MED ORDER — BLOOD GLUCOSE METER KIT
PACK | 0 refills | Status: DC
Start: 2023-04-03 — End: 2023-05-08

## 2023-04-03 NOTE — Assessment & Plan Note (Signed)
To reestablish LOV 2020.  Since then he stopped getting medical care.   Eventually admitted to the hospital  and discharge 02/01/2023. Presented with DOE, lower extremity edema, elevated BP, Dx heart failure. Echo: EF 25%. Suspected hypertensive cardiomyopathy. Cardiac cath: No CAD. Cardiac MRI: Moderate LVH, + global hypokinesis.  + Evidence of amyloidosis or infiltrative cardiomyopathy. A1c 6.6. AKI: Creatinine was slightly elevated, urinalysis showed mild protein and a small hemoglobin. LDL was 161: Recommended Lipitor 80 mg. Chest x-ray with PNM versus ATX, they recommended follow-up chest x-ray. Hypertensive cardiomyopathy: Recently admitted to hospital, currently doing well with essentially no symptoms, does not seem to be volume overloaded, continue Jardiance, Lasix, spironolactone.  Increase carvedilol to 12.5 mg twice daily.  Checking a CMP.  Has an appointment to see cardiology soon. HTN: BP elevated today at 148/100, no ambulatory BPs, increase carvedilol to 12.5 mg twice daily, continue Lasix and Aldactone.  Labs AKI: Creatinine upon admission was elevated, mild proteinuria noted, rechecking creatinine level, micro and UA.  Further advised for results.  This could become CKD High cholesterol: Started atorvastatin 80 mg.  Labs. DM: On Jardiance, last A1c 6.5, request glucometer.  Will send. Abnormal chest x-ray: See last report, recheck. Snoring: Long history of snoring, in the past did not pursue a sleep study.  Years ago his weight was ~ 310 pounds, today is 263 pounds.  Reports snoring is much less and energy level is okay.  Reassess the issue periodically RTC 1 month

## 2023-04-03 NOTE — Patient Instructions (Signed)
Increase carvedilol to 12.5 mg twice daily Other medications the same  Check the  blood pressure regularly Blood pressure goal:  between 110/65 and  135/85. If it is consistently higher or lower, let me know     Diabetes, blood sugar goals: - early in AM fasting  ( blood sugar goal 70-130) - 2 hours after a meal (blood sugar goal less than 180)    GO TO THE LAB : Get the blood work     GO TO THE FRONT DESK, PLEASE SCHEDULE YOUR APPOINTMENTS Come back for   a checkup in 1 month  STOP BY THE FIRST FLOOR:  get the XR

## 2023-04-03 NOTE — Progress Notes (Signed)
Subjective:    Patient ID: Michael Willis, male    DOB: 10-24-1975, 47 y.o.   MRN: [id number]  DOS:  04/03/2023 Type of visit - description: New patient, LOV 2020  Patient was seen last 2020, then stop getting medical care. Was eventually admitted with acute heart failure 01/2023. Here for follow-up.  Reports good med compliance. No ambulatory BPs. No ambulatory CBGs.  Denies chest pain, difficulty breathing. No lower extremity edema No nausea, vomiting, diarrhea.   Review of Systems See above   Past Medical History:  Diagnosis Date   Diabetes (HCC) 01/05/2013   Elevated LFTs 01/05/2013   HTN (hypertension) 12/12/2012   Hyperlipidemia 12/04/2013   Obesity    Tinea pedis, onychomycosis 01/05/2013    Past Surgical History:  Procedure Laterality Date   RIGHT/LEFT HEART CATH AND CORONARY ANGIOGRAPHY N/A 01/30/2023   Procedure: RIGHT/LEFT HEART CATH AND CORONARY ANGIOGRAPHY;  Surgeon: Dolores Patty, MD;  Location: [facility] INVASIVE CV LAB;  Service: Cardiovascular;  Laterality: N/A;   Social History   Socioeconomic History   Marital status: Married    Spouse name: Not on file   Number of children: 5   Years of education: Not on file   Highest education level: Not on file  Occupational History   Occupation: yard work  Tobacco Use   Smoking status: Former    Types: Software engineer, Pipe    Quit date: 09/18/2000    Years since quitting: 22.5   Smokeless tobacco: Never   Tobacco comments:    smoke a few cigars off an on .   Substance and Sexual Activity   Alcohol use: Yes    Comment: socially   Drug use: No   Sexual activity: Not on file  Other Topics Concern   Not on file  Social History Narrative   Married , household: pt, wife, wife's son   Exercise: none   Diet: regular   Social Determinants of Health   Financial Resource Strain: Not on file  Food Insecurity: No Food Insecurity (01/28/2023)   Hunger Vital Sign    Worried About Running Out of Food in the Last Year:  Never true    Ran Out of Food in the Last Year: Never true  Transportation Needs: No Transportation Needs (01/28/2023)   PRAPARE - Administrator, Civil Service (Medical): No    Lack of Transportation (Non-Medical): No  Physical Activity: Not on file  Stress: Not on file  Social Connections: Not on file  Intimate Partner Violence: Not At Risk (01/28/2023)   Humiliation, Afraid, Rape, and Kick questionnaire    Fear of Current or Ex-Partner: No    Emotionally Abused: No    Physically Abused: No    Sexually Abused: No   Family History  Problem Relation Age of Onset   CAD Mother 12       MI?   Diabetes Neg Hx    Stroke Neg Hx    Colon cancer Neg Hx    Prostate cancer Neg Hx      Current Outpatient Medications  Medication Instructions   atorvastatin (LIPITOR) 80 mg, Oral, Daily   blood glucose meter kit and supplies Dispense based on patient and insurance preference. Check blood sugar three times daily. DX: E11.9   blood glucose meter kit and supplies Use to check sugars twice a day. Dispense based on patient and insurance preference. Use up to four times daily as directed. (FOR ICD-10 E10.9, E11.9).   carvedilol (COREG) 12.5  mg, Oral, 2 times daily with meals   furosemide (LASIX) 20 mg, Oral, Daily PRN   glucose blood test strip Check blood sugar three times daily   Jardiance 10 mg, Oral, Daily   spironolactone (ALDACTONE) 25 mg, Oral, Daily       Objective:   Physical Exam BP (!) 148/100   Pulse 80   Temp 97.8 F (36.6 C) (Oral)   Resp 18   Ht 5\' 10"  (1.778 m)   Wt 263 lb 3.2 oz (119.4 kg)   SpO2 96%   BMI 37.77 kg/m  General:   Well developed, NAD, BMI noted.  HEENT:  Normocephalic . Face symmetric, atraumatic Lungs:  CTA B Normal respiratory effort, no intercostal retractions, no accessory muscle use. Heart: RRR,  no murmur.  Abdomen:  Not distended, soft, non-tender. No rebound or rigidity.   Skin: Not pale. Not jaundice Lower extremities: no  pretibial edema bilaterally  Neurologic:  alert & oriented X3.  Speech normal, gait appropriate for age and unassisted Psych--  Cognition and judgment appear intact.  Cooperative with normal attention span and concentration.  Behavior appropriate. No anxious or depressed appearing.     Assessment    Assessment (t reestablish 03/2023) DM 2014 HTN Hyperlipidemia Elevated LFTs Morbid obesity Onychomycosis Snoring: suspect OSA, saw pulmonary 2014, did not purse sleep study   PLAN: To reestablish LOV 2020.  Since then he stopped getting medical care.   Eventually admitted to the hospital  and discharge 02/01/2023. Presented with DOE, lower extremity edema, elevated BP, Dx heart failure. Echo: EF 25%. Suspected hypertensive cardiomyopathy. Cardiac cath: No CAD. Cardiac MRI: Moderate LVH, + global hypokinesis.  + Evidence of amyloidosis or infiltrative cardiomyopathy. A1c 6.6. AKI: Creatinine was slightly elevated, urinalysis showed mild protein and a small hemoglobin. LDL was 161: Recommended Lipitor 80 mg. Chest x-ray with PNM versus ATX, they recommended follow-up chest x-ray. Hypertensive cardiomyopathy: Recently admitted to hospital, currently doing well with essentially no symptoms, does not seem to be volume overloaded, continue Jardiance, Lasix, spironolactone.  Increase carvedilol to 12.5 mg twice daily.  Checking a CMP.  Has an appointment to see cardiology soon. HTN: BP elevated today at 148/100, no ambulatory BPs, increase carvedilol to 12.5 mg twice daily, continue Lasix and Aldactone.  Labs AKI: Creatinine upon admission was elevated, mild proteinuria noted, rechecking creatinine level, micro and UA.  Further advised for results.  This could become CKD High cholesterol: Started atorvastatin 80 mg.  Labs. DM: On Jardiance, last A1c 6.5, request glucometer.  Will send. Abnormal chest x-ray: See last report, recheck. Snoring: Long history of snoring, in the past did not  pursue a sleep study.  Years ago his weight was ~ 310 pounds, today is 263 pounds.  Reports snoring is much less and energy level is okay.  Reassess the issue periodically RTC 1 month

## 2023-04-08 MED ORDER — EZETIMIBE 10 MG PO TABS: 10.0000 mg | ORAL_TABLET | Freq: Every day | ORAL | 3 refills | Status: DC

## 2023-04-08 NOTE — Addendum Note (Signed)
Addended by[place]rad [place] on: 04/08/2023 10:27 AM   Modules accepted: Orders

## 2023-05-08 ENCOUNTER — Encounter: Payer: Self-pay | Admitting: Internal Medicine

## 2023-05-08 ENCOUNTER — Ambulatory Visit: Payer: Medicaid Other | Admitting: Internal Medicine

## 2023-05-08 VITALS — BP 132/84 | HR 71 | Temp 97.9°F | Resp 16 | Ht 70.0 in | Wt 265.2 lb

## 2023-05-08 DIAGNOSIS — I10 Essential (primary) hypertension: Secondary | ICD-10-CM | POA: Diagnosis not present

## 2023-05-08 DIAGNOSIS — E119 Type 2 diabetes mellitus without complications: Secondary | ICD-10-CM

## 2023-05-08 DIAGNOSIS — E785 Hyperlipidemia, unspecified: Secondary | ICD-10-CM | POA: Diagnosis not present

## 2023-05-08 LAB — LIPID PANEL
Cholesterol: 175 mg/dL (ref 0–200)
HDL: 41.7 mg/dL (ref >39.00)
LDL Cholesterol: 101 mg/dL — ABNORMAL HIGH (ref 0–99)
NonHDL: 133.4
Total CHOL/HDL Ratio: 4
Triglycerides: 160 mg/dL — ABNORMAL HIGH (ref 0.0–149.0)
VLDL: 32 mg/dL (ref 0.0–40.0)

## 2023-05-08 LAB — BASIC METABOLIC PANEL WITH GFR
BUN: 27 mg/dL — ABNORMAL HIGH (ref 6–23)
CO2: 27 meq/L (ref 19–32)
Calcium: 9.7 mg/dL (ref 8.4–10.5)
Chloride: 103 meq/L (ref 96–112)
Creatinine, Ser: 1.35 mg/dL (ref 0.40–1.50)
GFR: 62.97 mL/min (ref >60.00)
Glucose, Bld: 276 mg/dL — ABNORMAL HIGH (ref 70–99)
Potassium: 4.3 meq/L (ref 3.5–5.1)
Sodium: 137 meq/L (ref 135–145)

## 2023-05-08 LAB — HEMOGLOBIN A1C: Hgb A1c MFr Bld: 7.9 % — ABNORMAL HIGH (ref 4.6–6.5)

## 2023-05-08 MED ORDER — BLOOD GLUCOSE METER KIT: PACK | 0 refills | Status: DC

## 2023-05-08 NOTE — Assessment & Plan Note (Signed)
Follow-up from previous visit.  Seen in 2020 and then again 03-2023. DM: Blood sugar was 212 when we checked his blood the last time, currently diet controlled, reports he is doing okay with diet.  Check A1c. Will send a glucometer, ambulatory CBGs goals provided. High cholesterol: LDL 112, Zetia was added to atorvastatin.  Good compliance and tolerance.  Check FLP HTN: Currently on carvedilol, and Aldactone, on Lasix as needed for weight gain, has not needed it at all.    BP upon arrival 144/90, recheck: 132/84 Plan: Continue present care, encouraged to get blood pressure cuff and start checking his BP.  Goals provided. Hypertensive cardiomyopathy: To see cardiology next month.  Things seem to be stable. AKI: Creatinine in the hospital was 1.4, recheck the last time I saw him: 1.29, checking again today. Shoulder sprain: No neck pain, recommended sports medicine, declined , will call if needed, advised against NSAID, Tylenol okay. Vaccine advice provided, benefits discussed, states is  very reluctant to take any shots. RTC 3 months.

## 2023-05-08 NOTE — Progress Notes (Signed)
Subjective:    Patient ID: Michael Willis, male    DOB: Oct 24, 1975, 47 y.o.   MRN: [id number]  DOS:  05/08/2023 Type of visit - description: f/u  Since the last office visit is doing okay. Good med compliance. Denies chest pain or difficulty breathing. No lower extremity edema.  No DOE.  No cough.  Sprained his left shoulder while jumping on the pool, denies neck pain.   Review of Systems See above   Past Medical History:  Diagnosis Date   Diabetes (HCC) 01/05/2013   Elevated LFTs 01/05/2013   HTN (hypertension) 12/12/2012   Hyperlipidemia 12/04/2013   Obesity    Tinea pedis, onychomycosis 01/05/2013    Past Surgical History:  Procedure Laterality Date   RIGHT/LEFT HEART CATH AND CORONARY ANGIOGRAPHY N/A 01/30/2023   Procedure: RIGHT/LEFT HEART CATH AND CORONARY ANGIOGRAPHY;  Surgeon: Dolores Patty, MD;  Location: [facility];  Service: Cardiovascular;  Laterality: N/A;    Current Outpatient Medications  Medication Instructions   atorvastatin (LIPITOR) 80 mg, Oral, Daily   blood glucose meter kit and supplies Dispense based on patient and insurance preference. Check blood sugar three times daily. DX: E11.9   carvedilol (COREG) 12.5 mg, Oral, 2 times daily with meals   ezetimibe (ZETIA) 10 mg, Oral, Daily   furosemide (LASIX) 20 mg, Oral, Daily PRN   glucose blood test strip Check blood sugar three times daily   spironolactone (ALDACTONE) 25 mg, Oral, Daily       Objective:   Physical Exam BP (!) 144/90   Pulse 71   Temp 97.9 F (36.6 C) (Oral)   Resp 16   Ht 5\' 10"  (1.778 m)   Wt 265 lb 4 oz (120.3 kg)   SpO2 97%   BMI 38.06 kg/m  General:   Well developed, NAD, BMI noted.  HEENT:  Normocephalic . Face symmetric, atraumatic. Neck: No JVD Lungs:  CTA B Normal respiratory effort, no intercostal retractions, no accessory muscle use. Heart: RRR,  no murmur.  Abdomen:  Not distended, soft, non-tender. No rebound or rigidity.   Skin: Not pale.  Not jaundice Lower extremities: no pretibial edema bilaterally  Neurologic:  alert & oriented X3.  Speech normal, gait appropriate for age and unassisted Psych--  Cognition and judgment appear intact.  Cooperative with normal attention span and concentration.  Behavior appropriate. No anxious or depressed appearing.     Assessment    Assessment (t reestablish 03/2023) DM 2014 HTN Hyperlipidemia Elevated LFTs Morbid obesity Onychomycosis Snoring: suspect OSA, saw pulmonary 2014, did not purse sleep study   PLAN: Follow-up from previous visit.  Seen in 2020 and then again 03-2023. DM: Blood sugar was 212 when we checked his blood the last time, currently diet controlled, reports he is doing okay with diet.  Check A1c. Will send a glucometer, ambulatory CBGs goals provided. High cholesterol: LDL 112, Zetia was added to atorvastatin.  Good compliance and tolerance.  Check FLP HTN: Currently on carvedilol, and Aldactone, on Lasix as needed for weight gain, has not needed it at all.    BP upon arrival 144/90, recheck: 132/84 Plan: Continue present care, encouraged to get blood pressure cuff and start checking his BP.  Goals provided. Hypertensive cardiomyopathy: To see cardiology next month.  Things seem to be stable. AKI: Creatinine in the hospital was 1.4, recheck the last time I saw him: 1.29, checking again today. Shoulder sprain: No neck pain, recommended sports medicine, declined , will call if needed,  advised against NSAID, Tylenol okay. Vaccine advice provided, benefits discussed, states is  very reluctant to take any shots. RTC 3 months.

## 2023-05-08 NOTE — Patient Instructions (Addendum)
Vaccines I recommend: Covid booster -new this fall Flu shot this fall Tdap (tetanus)   Get a blood pressure cuff. Check the  blood pressure regularly Blood pressure goal:  between 110/65 and  135/85. If it is consistently higher or lower, let me know  Diabetes: You can check your sugars at different times, they right times to do it are: - early in AM fasting  ( blood sugar goal 70-130) - 2 hours after a meal (blood sugar goal less than 180)    GO TO THE LAB : Get the blood work     Next visit with me  in 3 months   Please schedule it at the front desk      Per our records you are due for your diabetic eye exam. Please contact your eye doctor to schedule an appointment. Please have them send copies of your office visit notes to [place]. Our fax number is [phone]. If you need a referral to an eye doctor please let us know.

## 2023-05-09 MED ORDER — METFORMIN HCL ER 500 MG PO TB24: ORAL_TABLET | ORAL | 1 refills | Status: DC

## 2023-05-09 NOTE — Addendum Note (Signed)
Addended byConrad [place] D on: 05/09/2023 04:36 PM   Modules accepted: Orders

## 2023-05-23 ENCOUNTER — Ambulatory Visit (HOSPITAL_BASED_OUTPATIENT_CLINIC_OR_DEPARTMENT_OTHER)
Admission: RE | Admit: 2023-05-23 | Discharge: 2023-05-23 | Disposition: A | Discharge status: Home / Self Care | Payer: Medicaid Other | Source: Ambulatory Visit | Attending: Internal Medicine | Admitting: Internal Medicine

## 2023-05-23 ENCOUNTER — Encounter (HOSPITAL_COMMUNITY): Payer: Self-pay | Admitting: Internal Medicine

## 2023-05-23 ENCOUNTER — Ambulatory Visit (HOSPITAL_COMMUNITY)
Admission: RE | Admit: 2023-05-23 | Discharge: 2023-05-23 | Disposition: A | Discharge status: Home / Self Care | Payer: Medicaid Other | Source: Ambulatory Visit | Attending: Internal Medicine | Admitting: Internal Medicine

## 2023-05-23 VITALS — BP 160/90 | HR 60 | Wt 264.8 lb

## 2023-05-23 DIAGNOSIS — E669 Obesity, unspecified: Secondary | ICD-10-CM | POA: Insufficient documentation

## 2023-05-23 DIAGNOSIS — F1290 Cannabis use, unspecified, uncomplicated: Secondary | ICD-10-CM | POA: Diagnosis not present

## 2023-05-23 DIAGNOSIS — N1831 Chronic kidney disease, stage 3a: Secondary | ICD-10-CM | POA: Diagnosis not present

## 2023-05-23 DIAGNOSIS — I110 Hypertensive heart disease with heart failure: Secondary | ICD-10-CM | POA: Diagnosis not present

## 2023-05-23 DIAGNOSIS — I5022 Chronic systolic (congestive) heart failure: Secondary | ICD-10-CM

## 2023-05-23 DIAGNOSIS — G4730 Sleep apnea, unspecified: Secondary | ICD-10-CM | POA: Insufficient documentation

## 2023-05-23 DIAGNOSIS — E785 Hyperlipidemia, unspecified: Secondary | ICD-10-CM | POA: Insufficient documentation

## 2023-05-23 DIAGNOSIS — E119 Type 2 diabetes mellitus without complications: Secondary | ICD-10-CM

## 2023-05-23 DIAGNOSIS — R9431 Abnormal electrocardiogram [ECG] [EKG]: Secondary | ICD-10-CM | POA: Diagnosis not present

## 2023-05-23 DIAGNOSIS — Z6837 Body mass index (BMI) 37.0-37.9, adult: Secondary | ICD-10-CM | POA: Diagnosis not present

## 2023-05-23 DIAGNOSIS — I428 Other cardiomyopathies: Secondary | ICD-10-CM | POA: Diagnosis present

## 2023-05-23 DIAGNOSIS — I10 Essential (primary) hypertension: Secondary | ICD-10-CM | POA: Diagnosis not present

## 2023-05-23 DIAGNOSIS — I5020 Unspecified systolic (congestive) heart failure: Secondary | ICD-10-CM | POA: Diagnosis present

## 2023-05-23 LAB — ECHOCARDIOGRAM COMPLETE
Area-P 1/2: 2.08 cm2
Calc EF: 52.2 %
S' Lateral: 4 cm
Single Plane A2C EF: 52.4 %
Single Plane A4C EF: 50.7 %

## 2023-05-23 LAB — BASIC METABOLIC PANEL
Anion gap: 8 (ref 5–15)
BUN: 22 mg/dL — ABNORMAL HIGH (ref 6–20)
CO2: 27 mmol/L (ref 22–32)
Calcium: 9.5 mg/dL (ref 8.9–10.3)
Chloride: 103 mmol/L (ref 98–111)
Creatinine, Ser: 1.43 mg/dL — ABNORMAL HIGH (ref 0.61–1.24)
GFR, Estimated: >60 mL/min (ref >60)
Glucose, Bld: 238 mg/dL — ABNORMAL HIGH (ref 70–99)
Potassium: 4.3 mmol/L (ref 3.5–5.1)
Sodium: 138 mmol/L (ref 135–145)

## 2023-05-23 LAB — BRAIN NATRIURETIC PEPTIDE: B Natriuretic Peptide: 48.8 pg/mL (ref 0.0–100.0)

## 2023-05-23 MED ORDER — EZETIMIBE 10 MG PO TABS: 10.0000 mg | ORAL_TABLET | Freq: Every day | ORAL | 3 refills | Status: AC

## 2023-05-23 MED ORDER — CARVEDILOL 12.5 MG PO TABS: 12.5000 mg | ORAL_TABLET | Freq: Two times a day (BID) | ORAL | 6 refills | Status: DC

## 2023-05-23 MED ORDER — SPIRONOLACTONE 25 MG PO TABS: 25.0000 mg | ORAL_TABLET | Freq: Every day | ORAL | 6 refills | Status: DC

## 2023-05-23 MED ORDER — PERFLUTREN LIPID MICROSPHERE
1.0000 mL | INTRAVENOUS | Status: DC | PRN
  Administered 2023-05-23: 3 mL via INTRAVENOUS

## 2023-05-23 MED ORDER — ATORVASTATIN CALCIUM 80 MG PO TABS: 80.0000 mg | ORAL_TABLET | Freq: Every day | ORAL | 6 refills | Status: DC

## 2023-05-23 MED ORDER — FUROSEMIDE 20 MG PO TABS: 20.0000 mg | ORAL_TABLET | Freq: Every day | ORAL | 6 refills | Status: AC | PRN

## 2023-05-23 NOTE — Patient Instructions (Signed)
Medication Changes:  None, continue current medications, refills have been sent to pharmacy  Lab Work:  Labs done today, your results will be available in MyChart, we will contact you for abnormal readings.  Testing/Procedures:  Your physician has recommended that you have a sleep study. This test records several body functions during sleep, including: brain activity, eye movement, oxygen and carbon dioxide blood levels, heart rate and rhythm, breathing rate and rhythm, the flow of air through your mouth and nose, snoring, body muscle movements, and chest and belly movement. **WE WILL CHECK WITH YOUR INSURANCE COMPANY TO SEE IF THEY WILL APPROVE A HOME TEST OR IF YOU NEED TO GO TO A SLEEP CENTER AND WILL LET YOU KNOW  Referrals:  none  Special Instructions // Education:  Do the following things EVERYDAY: Weigh yourself in the morning before breakfast. Write it down and keep it in a log. Take your medicines as prescribed Eat low salt foods--Limit salt (sodium) to 2000 mg per day.  Stay as active as you can everyday Limit all fluids for the day to less than 2 liters   Follow-Up in: 3 months   At the [facility], you and your health needs are our priority. We have a designated team specialized in the treatment of Heart Failure. This Care Team includes your primary Heart Failure Specialized Cardiologist (physician), Advanced Practice Providers (APPs- Physician Assistants and Nurse Practitioners), and Pharmacist who all work together to provide you with the care you need, when you need it.   You may see any of the following providers on your designated Care Team at your next follow up:  Dr. Arvilla Meres Dr. Marca Ancona Dr. Dorthula Nettles Dr. Theresia Bough Tonye Becket, NP Robbie Lis, [place] [facility] [place], [place] Brynda Peon, NP [place] Lee, NP Karle Plumber, PharmD   Please be sure to bring in all your medications bottles to every  appointment.   Need to Contact us:  If you have any questions or concerns before your next appointment please send [place] a message through [place] or call our office at [phone].    TO LEAVE A MESSAGE FOR THE NURSE SELECT OPTION 2, PLEASE LEAVE A MESSAGE INCLUDING: YOUR NAME DATE OF BIRTH CALL BACK NUMBER REASON FOR CALL**this is important as we prioritize the call backs  YOU WILL RECEIVE A CALL BACK THE SAME DAY AS LONG AS YOU CALL BEFORE 4:00 PM

## 2023-05-23 NOTE — Progress Notes (Addendum)
ADVANCED HF CLINIC NOTE  Primary Care: Gwinda Passe, NP HF Cardiologist: Dr. Gala Romney  HPI:  47 y.o. male with history of DM II, HTN, hyperlipidemia, obesity and new diagnosis of systolic heart failure/NICM.   Hasn't not seen a provider in the system since 2021. Not taking any blood pressure or diabetes medications for several years.   Admitted 6/24 with new acute systolic heart failure. Echo showed EF 25-30%, severe LVH, RV ok. R/LHC showed no CAD, NICM EF 30%, RA 3, PA 28/11 (17), PCW 5, Fick CO/CI 5.5/2.3, PVR 2.2. cMRI showed LVEF 12%, suggestive of cardiac amyloidosis, RVEF 25%. PYP not suggestive of amyloidosis, grade 0, H/CL equal 1.09. GDMT titrated and he was discharged home, weight 247 lbs.    Today he returns for HF follow up. Taking meds every day but not this morning. Doing fine. No CP or SOB. No edema.    Echo today 05/24/23: EF 55-60% (read as 50-55%)   Mother passed with CHF, PPM and ESRD.No other relatives with CHF. No family history of SCD.   Cardiac Studies: - PYP (6/24) equivocal for cardiac amyloidosis, grade 0, H/CL equal 1.09  - L/RHC (6/24): No CAD, NICM EF 30%, RA 3, PA 28/11 (17), PCW 5, Fick CO/CI 5.5/2.3, PVR 2.2   - cMRI (6/24) LVEF 12% with suggestion of cardiac amyloidosis, RVEF 26%  - Echo (6/24): EF 25-30%, severe LVH, RV mildly down.    Past Medical History:  Diagnosis Date   Diabetes (HCC) 01/05/2013   Elevated LFTs 01/05/2013   HTN (hypertension) 12/12/2012   Hyperlipidemia 12/04/2013   Obesity    Tinea pedis, onychomycosis 01/05/2013   Current Outpatient Medications  Medication Sig Dispense Refill   atorvastatin (LIPITOR) 80 MG tablet Take 1 tablet (80 mg total) by mouth daily. 30 tablet 2   blood glucose meter kit and supplies Dispense based on patient and insurance preference. Check blood sugar twice daily. DX: E11.9 1 each 0   carvedilol (COREG) 12.5 MG tablet Take 1 tablet (12.5 mg total) by mouth 2 (two) times daily with a meal.  60 tablet 3   ezetimibe (ZETIA) 10 MG tablet Take 1 tablet (10 mg total) by mouth daily. 90 tablet 3   furosemide (LASIX) 20 MG tablet Take 1 tablet (20 mg total) by mouth daily as needed (weight gain 3 lbs in 1 day or 5 lbs in 1 week). 30 tablet 2   glucose blood test strip Check blood sugar three times daily 300 each 12   spironolactone (ALDACTONE) 25 MG tablet Take 1 tablet (25 mg total) by mouth daily. 30 tablet 2   metFORMIN (GLUCOPHAGE-XR) 500 MG 24 hr tablet Take 1 tablet by mouth daily for 1 week then increase to 2 tablets daily (Patient not taking: Reported on 05/23/2023) 180 tablet 1   No current facility-administered medications for this encounter.   Facility-Administered Medications Ordered in Other Encounters  Medication Dose Route Frequency Provider Last Rate Last Admin   perflutren lipid microspheres (DEFINITY) IV suspension  1-10 mL Intravenous PRN Jacklynn Ganong, FNP   3 mL at 05/23/23 0847    No Known Allergies    Social History   Socioeconomic History   Marital status: Married    Spouse name: Not on file   Number of children: 5   Years of education: Not on file   Highest education level: Not on file  Occupational History   Occupation: yard work  Tobacco Use   Smoking status: Former  Types: Cigars, Pipe    Quit date: 09/18/2000    Years since quitting: 22.6   Smokeless tobacco: Never   Tobacco comments:    smoke a few cigars off an on .   Substance and Sexual Activity   Alcohol use: Yes    Comment: socially   Drug use: No   Sexual activity: Not on file  Other Topics Concern   Not on file  Social History Narrative   Married , household: pt, wife, wife's son   Exercise: none   Diet: regular   Social Determinants of Health   Financial Resource Strain: Not on file  Food Insecurity: No Food Insecurity (01/28/2023)   Hunger Vital Sign    Worried About Running Out of Food in the Last Year: Never true    Ran Out of Food in the Last Year: Never true   Transportation Needs: No Transportation Needs (01/28/2023)   PRAPARE - Administrator, Civil Service (Medical): No    Lack of Transportation (Non-Medical): No  Physical Activity: Not on file  Stress: Not on file  Social Connections: Not on file  Intimate Partner Violence: Not At Risk (01/28/2023)   Humiliation, Afraid, Rape, and Kick questionnaire    Fear of Current or Ex-Partner: No    Emotionally Abused: No    Physically Abused: No    Sexually Abused: No      Family History  Problem Relation Age of Onset   CAD Mother 3       MI?   Diabetes Neg Hx    Stroke Neg Hx    Colon cancer Neg Hx    Prostate cancer Neg Hx    BP (!) 160/90   Pulse 60   Wt 120.1 kg (264 lb 12.8 oz)   SpO2 97%   BMI 37.99 kg/m   Wt Readings from Last 3 Encounters:  05/23/23 120.1 kg (264 lb 12.8 oz)  05/08/23 120.3 kg (265 lb 4 oz)  04/03/23 119.4 kg (263 lb 3.2 oz)   PHYSICAL EXAM: General:  NAD. No resp difficulty, walked into clinic HEENT: Normal Neck: Supple. No JVD. Carotids 2+ bilat; no bruits. No lymphadenopathy or thryomegaly appreciated. Cor: PMI nondisplaced. Regular rate & rhythm. No rubs, gallops or murmurs. Lungs: Clear Abdomen: Soft, nontender, nondistended. No hepatosplenomegaly. No bruits or masses. Good bowel sounds. Extremities: No cyanosis, clubbing, rash, edema Neuro: Alert & oriented x 3, cranial nerves grossly intact. Moves all 4 extremities w/o difficulty. Affect pleasant.  ECG (personally reviewed): NSR 67 +LVH Personally reviewed   ASSESSMENT & PLAN: 1. Chronic systolic heart failure - Bedside echo by Dr. Gala Romney (01/28/23):EF ~25% and moderate to severe RV dysfunction.  - Echo (01/29/23): EF 25-30%, LV with GHK, severe concentric LHV, mildly reduced RV, trivial MR/TR - R/LHC (6/24): No CAD, NICM EF 30%, RA 3, PA 28/11 (17), PCW 5, Fick CO/CI 5.5/2.3, PVR 2.2 - cMRI (6/24): LVEF 12% with suggestion of cardiac amyloidosis, RVEF 26%. Family history  strongly suggestive of familial TTR cardiac amyloidosis.  - PYP (6/24) not suggestive of TTR cardiac amyloidosis, grade 0, H/CL equal 1.09 - SPEP negative - Echo today 05/24/23  EF 55-60% complete recovery  - Arrange for genetic testing.  - Improved NYHA I-II, volume stable. - Continue Entresto 97/103 mg bid. Patient assistance - Continue spironolactone 25 mg daily. - Continue Jardiance 10 mg daily. Patient assistance - Continue Coreg 3.125 mg bid - Continue Lasix 20 PRN. - With complete recovery of LV function with  meds, suspect this is not an infiltrative process. Will repeat MRI at next visit to reassess. May need w/u for sarcoid   2. HTN - Previously uncontrolled HTN - BP much improved when on meds.Up today as he didn't take as he didn't take meds  - GDMT as above - Arrange sleep study for severe snoring   3. DM II - Has been off meds for at least a few years - A1c 6.6 - On SGLT2i - Per PCP   4. Obesity  - Body mass index is 37.99 kg/m. - Arrange sleep study when he has insurance - Would benefit from GLP-1 agonist to assist with weight loss, defer to PCP   5. H/o AKI on ? CKD IIIa - Unknown baseline - likely HTN and cardiorenal - Labs today.   6. Hyperlipidemia - LDL 161 - Continue statin  7. Substance use - Smokes THC daily - Discussed cessation   Arvilla Meres, MD  9:16 AM

## 2023-05-30 ENCOUNTER — Other Ambulatory Visit (HOSPITAL_COMMUNITY): Payer: Self-pay | Admitting: Internal Medicine

## 2023-06-27 ENCOUNTER — Other Ambulatory Visit: Payer: Self-pay

## 2023-07-09 ENCOUNTER — Other Ambulatory Visit: Payer: Self-pay

## 2023-07-25 ENCOUNTER — Other Ambulatory Visit: Payer: Self-pay

## 2023-07-31 ENCOUNTER — Encounter: Payer: Self-pay | Admitting: Internal Medicine

## 2023-07-31 ENCOUNTER — Ambulatory Visit: Payer: Medicaid Other | Admitting: Internal Medicine

## 2023-07-31 VITALS — BP 138/86 | HR 56 | Temp 97.7°F | Resp 18 | Ht 70.0 in | Wt 260.5 lb

## 2023-07-31 DIAGNOSIS — E785 Hyperlipidemia, unspecified: Secondary | ICD-10-CM | POA: Diagnosis not present

## 2023-07-31 DIAGNOSIS — Z7984 Long term (current) use of oral hypoglycemic drugs: Secondary | ICD-10-CM | POA: Diagnosis not present

## 2023-07-31 DIAGNOSIS — E119 Type 2 diabetes mellitus without complications: Secondary | ICD-10-CM | POA: Diagnosis not present

## 2023-07-31 LAB — HEMOGLOBIN A1C: Hgb A1c MFr Bld: 10 % — ABNORMAL HIGH (ref 4.6–6.5)

## 2023-07-31 NOTE — Patient Instructions (Addendum)
According to the notes from cardiology you are supposed to be on Entresto, Jardiance.  That is not reflected in your medication list, please reach out to Dr. Gala Romney for clarification.     GO TO THE LAB : Get the blood work     Next visit with me in 4 months , please schedule it at the front desk      Per our records you are due for your diabetic eye exam. Please contact your eye doctor to schedule an appointment. Please have them send copies of your office visit notes to [place]. Our fax number is [phone]. If you need a referral to an eye doctor please let us know.

## 2023-07-31 NOTE — Progress Notes (Signed)
Subjective:    Patient ID: Michael Willis, male    DOB: 1976/06/18, 47 y.o.   MRN: [id number]  DOS:  07/31/2023 Type of visit - description: f/u  Here for diabetes and high cholesterol evaluation. See comments under medication compliance. Reports he is feeling well, ambulatory BPs WNL.  No ambulatory CBGs.  Review of Systems See above   Past Medical History:  Diagnosis Date   Diabetes (HCC) 01/05/2013   Elevated LFTs 01/05/2013   HTN (hypertension) 12/12/2012   Hyperlipidemia 12/04/2013   Obesity    Tinea pedis, onychomycosis 01/05/2013    Past Surgical History:  Procedure Laterality Date   RIGHT/LEFT HEART CATH AND CORONARY ANGIOGRAPHY N/A 01/30/2023   Procedure: RIGHT/LEFT HEART CATH AND CORONARY ANGIOGRAPHY;  Surgeon: Dolores Patty, MD;  Location: [facility];  Service: Cardiovascular;  Laterality: N/A;    Current Outpatient Medications  Medication Instructions   atorvastatin (LIPITOR) 80 mg, Oral, Daily   blood glucose meter kit and supplies Dispense based on patient and insurance preference. Check blood sugar twice daily. DX: E11.9   carvedilol (COREG) 12.5 mg, Oral, 2 times daily with meals   ezetimibe (ZETIA) 10 mg, Oral, Daily   furosemide (LASIX) 20 mg, Oral, Daily PRN   glucose blood test strip Check blood sugar three times daily   metFORMIN (GLUCOPHAGE-XR) 500 MG 24 hr tablet Take 1 tablet by mouth daily for 1 week then increase to 2 tablets daily   spironolactone (ALDACTONE) 25 mg, Oral, Daily       Objective:   Physical Exam BP 138/86   Pulse (!) 56   Temp 97.7 F (36.5 C) (Oral)   Resp 18   Ht 5\' 10"  (1.778 m)   Wt 260 lb 8 oz (118.2 kg)   SpO2 98%   BMI 37.38 kg/m  General:   Well developed, NAD, BMI noted. HEENT:  Normocephalic . Face symmetric, atraumatic Lungs:  CTA B Normal respiratory effort, no intercostal retractions, no accessory muscle use. Heart: RRR,  no murmur.  DM foot exam: No edema, pinprick examination WNL Skin:  Not pale. Not jaundice Neurologic:  alert & oriented X3.  Speech normal, gait appropriate for age and unassisted Psych--  Cognition and judgment appear intact.  Cooperative with normal attention span and concentration.  Behavior appropriate. No anxious or depressed appearing.      Assessment   Assessment (t reestablish 03/2023) DM 2014 HTN Hyperlipidemia Elevated LFTs Chronic systolic heart failure Morbid obesity Onychomycosis Snoring: suspect OSA, saw pulmonary 2014, did not purse sleep study   PLAN: DM: A1c 2 months ago was 7.9, recommended to start metformin ER.  Also encouraged to see a nutritionist. According to cardiology notes he is also on Jardiance.  Check A1c. Foot exam negative. High cholesterol: Improving gradually, on atorvastatin 80 mg and Zetia.  Anticipate LDL will get gradually better once DM is better Chronic systolic heart failure: Saw cardiology, they are under the impression that he is taking Entresto and Jardiance, that is not reflected in his medication list.  Will let cardiology know. Medication compliance: I ask about med compliance, he said "I do not know, my wife arrange my medications for me".  Encouraged to 1 day prior to any office visit he d/w  his wife the medications he is taking to provide accurate information. Of note, during the entire office visit the patient was on his cell phone "buying some tickets", I prompted him 2 times to listen to me and to  what I am saying but he continue using his cell phone. Vaccine advice: "I do not take vaccines". RTC 4 months

## 2023-07-31 NOTE — Assessment & Plan Note (Signed)
DM: A1c 2 months ago was 7.9, recommended to start metformin ER.  Also encouraged to see a nutritionist. According to cardiology notes he is also on Jardiance.  Check A1c. Foot exam negative. High cholesterol: Improving gradually, on atorvastatin 80 mg and Zetia.  Anticipate LDL will get gradually better once DM is better Chronic systolic heart failure: Saw cardiology, they are under the impression that he is taking Entresto and Jardiance, that is not reflected in his medication list.  Will let cardiology know. Medication compliance: I ask about med compliance, he said "I do not know, my wife arrange my medications for me".  Encouraged to 1 day prior to any office visit he d/w  his wife the medications he is taking to provide accurate information. Of note, during the entire office visit the patient was on his cell phone "buying some tickets", I prompted him 2 times to listen to me and to  what I am saying but he continue using his cell phone. Vaccine advice: "I do not take vaccines". RTC 4 months

## 2023-08-02 ENCOUNTER — Telehealth (HOSPITAL_COMMUNITY): Payer: Self-pay | Admitting: Cardiology

## 2023-08-02 MED ORDER — METFORMIN HCL ER 500 MG PO TB24: 1000.0000 mg | ORAL_TABLET | Freq: Two times a day (BID) | ORAL | 0 refills | Status: DC

## 2023-08-02 NOTE — Telephone Encounter (Signed)
Pts wife left VM on triage line with reports PCP wants to start metformin and they are not doing this, would like to discuss further   Returned call to pt, Michael Willis asked to discuss further with his wife  [facility] for wife

## 2023-08-02 NOTE — Addendum Note (Signed)
Addended byConrad [place] D on: 08/02/2023 01:07 PM   Modules accepted: Orders

## 2023-08-09 NOTE — Telephone Encounter (Signed)
Returned call to pt Reports all is well at this time Pt and wife called right after PCP appt to discuss with provider medications. Reports PCP to put him on metformin however he is not taking it. Reports he had an issue with metformin in the past. Aware his A1c is high and he knows why, states he is on top of it.

## 2023-08-26 ENCOUNTER — Inpatient Hospital Stay (HOSPITAL_COMMUNITY)
Admission: RE | Admit: 2023-08-26 | Discharge: 2023-08-26 | Disposition: A | Discharge status: Home / Self Care | Payer: Medicaid Other | Source: Ambulatory Visit

## 2023-08-30 ENCOUNTER — Encounter (HOSPITAL_COMMUNITY): Payer: Medicaid Other

## 2023-11-01 ENCOUNTER — Other Ambulatory Visit: Payer: Self-pay

## 2023-11-01 ENCOUNTER — Encounter (HOSPITAL_COMMUNITY): Payer: Self-pay

## 2023-11-01 ENCOUNTER — Emergency Department (HOSPITAL_COMMUNITY)
Admission: EM | Admit: 2023-11-01 | Discharge: 2023-11-01 | Disposition: A | Discharge status: Home / Self Care | Attending: Emergency Medicine | Admitting: Emergency Medicine

## 2023-11-01 DIAGNOSIS — M7918 Myalgia, other site: Secondary | ICD-10-CM

## 2023-11-01 DIAGNOSIS — M545 Low back pain, unspecified: Secondary | ICD-10-CM | POA: Diagnosis present

## 2023-11-01 MED ORDER — DICLOFENAC SODIUM 1 % EX GEL: 4.0000 g | Freq: Four times a day (QID) | CUTANEOUS | 0 refills | Status: DC

## 2023-11-01 MED ORDER — OXYCODONE HCL 5 MG PO TABS
5.0000 mg | ORAL_TABLET | Freq: Once | ORAL | Status: AC
  Administered 2023-11-01: 5 mg via ORAL
  Filled 2023-11-01: qty 1

## 2023-11-01 MED ORDER — ACETAMINOPHEN 500 MG PO TABS
1000.0000 mg | ORAL_TABLET | Freq: Once | ORAL | Status: AC
  Administered 2023-11-01: 1000 mg via ORAL
  Filled 2023-11-01: qty 2

## 2023-11-01 MED ORDER — KETOROLAC TROMETHAMINE 15 MG/ML IJ SOLN
15.0000 mg | Freq: Once | INTRAMUSCULAR | Status: AC
  Administered 2023-11-01: 15 mg via INTRAMUSCULAR
  Filled 2023-11-01: qty 1

## 2023-11-01 MED ORDER — DIAZEPAM 5 MG PO TABS
5.0000 mg | ORAL_TABLET | Freq: Once | ORAL | Status: AC
  Administered 2023-11-01: 5 mg via ORAL
  Filled 2023-11-01: qty 1

## 2023-11-01 MED ORDER — METHOCARBAMOL 500 MG PO TABS: 500.0000 mg | ORAL_TABLET | Freq: Two times a day (BID) | ORAL | 0 refills | Status: DC

## 2023-11-01 MED ORDER — METHYLPREDNISOLONE 4 MG PO TBPK: ORAL_TABLET | ORAL | 0 refills | Status: DC

## 2023-11-01 NOTE — Discharge Instructions (Signed)
Your back pain is most likely due to a muscular strain.  There is been a lot of research on back pain, unfortunately the only thing that seems to really help is Tylenol and ibuprofen.  Relative rest is also important to not lift greater than 10 pounds bending or twisting at the waist.  Please follow-up with your family physician.  The other thing that really seems to benefit patients is physical therapy which your doctor may send you for.  Please return to the emergency department for new numbness or weakness to your arms or legs. Difficulty with urinating or urinating or pooping on yourself.  Also if you cannot feel toilet paper when you wipe or get a fever.   Use the gel as prescribed.  Take the steroids as prescribed Also take tylenol 1000mg (2 extra strength) four times a day.

## 2023-11-01 NOTE — ED Provider Notes (Signed)
 [place] EMERGENCY DEPARTMENT AT [facility] Provider Note   CSN: [id number] Arrival date & time: 11/01/23  4098     History  No chief complaint on file.   Michael Willis is a 48 y.o. male.  48 yo M with a chief complaint of left buttock pain.  This been going on for about 4 days.  Worse with certain positions movement sitting.  He denies trauma to the area denies radiation of the pain.  Denies loss of bowel or bladder denies loss of rectal sensation denies numbness or weakness to the leg.        Home Medications Prior to Admission medications   Medication Sig Start Date End Date Taking? Authorizing Provider  diclofenac Sodium (VOLTAREN) 1 % GEL Apply 4 g topically 4 (four) times daily. 11/01/23  Yes Melene Plan, DO  methocarbamol (ROBAXIN) 500 MG tablet Take 1 tablet (500 mg total) by mouth 2 (two) times daily. 11/01/23  Yes Melene Plan, DO  methylPREDNISolone (MEDROL DOSEPAK) 4 MG TBPK tablet Day 1: 8mg  before breakfast, 4 mg after lunch, 4 mg after supper, and 8 mg at bedtime Day 2: 4 mg before breakfast, 4 mg after lunch, 4 mg  after supper, and 8 mg  at bedtime Day 3:  4 mg  before breakfast, 4 mg  after lunch, 4 mg after supper, and 4 mg  at bedtime Day 4: 4 mg  before breakfast, 4 mg  after lunch, and 4 mg at bedtime Day 5: 4 mg  before breakfast and 4 mg at bedtime Day 6: 4 mg  before breakfast 11/01/23  Yes Melene Plan, DO  atorvastatin (LIPITOR) 80 MG tablet Take 1 tablet by mouth once daily 05/30/23   Bensimhon, Bevelyn Buckles, MD  blood glucose meter kit and supplies Dispense based on patient and insurance preference. Check blood sugar twice daily. DX: E11.9 05/08/23   Wanda Plump, MD  carvedilol (COREG) 12.5 MG tablet Take 1 tablet (12.5 mg total) by mouth 2 (two) times daily with a meal. 05/23/23   Bensimhon, Bevelyn Buckles, MD  ezetimibe (ZETIA) 10 MG tablet Take 1 tablet (10 mg total) by mouth daily. 05/23/23   Bensimhon, Bevelyn Buckles, MD  furosemide (LASIX) 20 MG tablet Take 1  tablet (20 mg total) by mouth daily as needed (weight gain 3 lbs in 1 day or 5 lbs in 1 week). 05/23/23   Bensimhon, Bevelyn Buckles, MD  glucose blood test strip Check blood sugar three times daily 11/29/16   Wanda Plump, MD  metFORMIN (GLUCOPHAGE-XR) 500 MG 24 hr tablet Take 2 tablets (1,000 mg total) by mouth 2 (two) times daily with a meal. 08/02/23   Wanda Plump, MD  spironolactone (ALDACTONE) 25 MG tablet Take 1 tablet (25 mg total) by mouth daily. 05/23/23   Bensimhon, Bevelyn Buckles, MD      Allergies    Patient has no known allergies.    Review of Systems   Review of Systems  Physical Exam Updated Vital Signs BP (!) 175/111 (BP Location: Left Arm)   Pulse 97   Temp (!) 97.5 F (36.4 C)   Resp 18   Ht 5' 10.5" (1.791 m)   Wt 113.4 kg   SpO2 99%   BMI 35.36 kg/m  Physical Exam Vitals and nursing note reviewed.  Constitutional:      Appearance: He is well-developed.  HENT:     Head: Normocephalic and atraumatic.  Eyes:     Pupils: Pupils  are equal, round, and reactive to light.  Neck:     Vascular: No JVD.  Cardiovascular:     Rate and Rhythm: Normal rate and regular rhythm.     Heart sounds: No murmur heard.    No friction rub. No gallop.  Pulmonary:     Effort: No respiratory distress.     Breath sounds: No wheezing.  Abdominal:     General: There is no distension.     Tenderness: There is no abdominal tenderness. There is no guarding or rebound.  Musculoskeletal:        General: Normal range of motion.     Cervical back: Normal range of motion and neck supple.     Comments: Pulse motor and sensation intact to the left lower extremity.  Pain about the piriformis muscle belly.  I do not appreciate any erythema induration or fluctuance.  Skin:    Coloration: Skin is not pale.     Findings: No rash.  Neurological:     Mental Status: He is alert and oriented to person, place, and time.  Psychiatric:        Behavior: Behavior normal.     ED Results / Procedures /  Treatments   Labs (all labs ordered are listed, but only abnormal results are displayed) Labs Reviewed - No data to display  EKG None  Radiology No results found.  Procedures Procedures    Medications Ordered in ED Medications  ketorolac (TORADOL) 15 MG/ML injection 15 mg (has no administration in time range)  acetaminophen (TYLENOL) tablet 1,000 mg (has no administration in time range)  oxyCODONE (Oxy IR/ROXICODONE) immediate release tablet 5 mg (has no administration in time range)  diazepam (VALIUM) tablet 5 mg (has no administration in time range)    ED Course/ Medical Decision Making/ A&P                                 Medical Decision Making Risk OTC drugs. Prescription drug management.   48 yo M with a chief complaint of left buttock pain.  Going on for about 4 days.  Difficult to tell if this is radicular back pain or piriformis syndrome.  I do not appreciate any obvious abscess or cellulitis or perhaps rash to suggest shingles.  Will treat as musculoskeletal.  PCP follow-up.  On record the patient has a history of pretty significant heart failure.  Likely it seems that he now has preserved EF.  I will try to stay away from NSAIDs as an outpatient.  8:37 AM:  I have discussed the diagnosis/risks/treatment options with the patient.  Evaluation and diagnostic testing in the emergency department does not suggest an emergent condition requiring admission or immediate intervention beyond what has been performed at this time.  They will follow up with PCP. We also discussed returning to the ED immediately if new or worsening sx occur. We discussed the sx which are most concerning (e.g., sudden worsening pain, fever, inability to tolerate by mouth, cauda equina s/sx, redness, rash) that necessitate immediate return. Medications administered to the patient during their visit and any new prescriptions provided to the patient are listed below.  Medications given during this  visit Medications  ketorolac (TORADOL) 15 MG/ML injection 15 mg (has no administration in time range)  acetaminophen (TYLENOL) tablet 1,000 mg (has no administration in time range)  oxyCODONE (Oxy IR/ROXICODONE) immediate release tablet 5 mg (has no administration in time  range)  diazepam (VALIUM) tablet 5 mg (has no administration in time range)     The patient appears reasonably screen and/or stabilized for discharge and I doubt any other medical condition or other [facility] requiring further screening, evaluation, or treatment in the ED at this time prior to discharge.            Final Clinical Impression(s) / ED Diagnoses Final diagnoses:  Right buttock pain    Rx / DC Orders ED Discharge Orders          Ordered    methylPREDNISolone (MEDROL DOSEPAK) 4 MG TBPK tablet        11/01/23 0833    diclofenac Sodium (VOLTAREN) 1 % GEL  4 times daily        11/01/23 0833    methocarbamol (ROBAXIN) 500 MG tablet  2 times daily        11/01/23 0833              Melene Plan, DO 11/01/23 [phone]

## 2023-11-01 NOTE — ED Triage Notes (Signed)
 Pt c/o left buttock pain x 5 days. Pt denies injury. Pt denies bruising, swelling, or numbness/tingling.

## 2023-11-03 ENCOUNTER — Other Ambulatory Visit: Payer: Self-pay

## 2023-11-03 ENCOUNTER — Encounter (HOSPITAL_COMMUNITY): Payer: Self-pay | Admitting: *Deleted

## 2023-11-03 ENCOUNTER — Emergency Department (HOSPITAL_COMMUNITY)
Admission: EM | Admit: 2023-11-03 | Discharge: 2023-11-03 | Discharge status: Against Medical Advice | Attending: Emergency Medicine | Admitting: Emergency Medicine

## 2023-11-03 DIAGNOSIS — R200 Anesthesia of skin: Secondary | ICD-10-CM | POA: Insufficient documentation

## 2023-11-03 DIAGNOSIS — M545 Low back pain, unspecified: Secondary | ICD-10-CM | POA: Diagnosis present

## 2023-11-03 DIAGNOSIS — Z5321 Procedure and treatment not carried out due to patient leaving prior to being seen by health care provider: Secondary | ICD-10-CM | POA: Diagnosis not present

## 2023-11-03 DIAGNOSIS — E119 Type 2 diabetes mellitus without complications: Secondary | ICD-10-CM | POA: Insufficient documentation

## 2023-11-03 LAB — URINALYSIS, ROUTINE W REFLEX MICROSCOPIC
Bilirubin Urine: NEGATIVE
Glucose, UA: >=500 mg/dL — AB
Hgb urine dipstick: NEGATIVE
Ketones, ur: NEGATIVE mg/dL
Nitrite: NEGATIVE
Protein, ur: NEGATIVE mg/dL
Specific Gravity, Urine: 1.015 (ref 1.005–1.030)
WBC, UA: >50 WBC/hpf (ref 0–5)
pH: 5 (ref 5.0–8.0)

## 2023-11-03 LAB — CBC
HCT: 44.3 % (ref 39.0–52.0)
Hemoglobin: 14.8 g/dL (ref 13.0–17.0)
MCH: 28.7 pg (ref 26.0–34.0)
MCHC: 33.4 g/dL (ref 30.0–36.0)
MCV: 86 fL (ref 80.0–100.0)
Platelets: 256 10*3/uL (ref 150–400)
RBC: 5.15 MIL/uL (ref 4.22–5.81)
RDW: 11.5 % (ref 11.5–15.5)
WBC: 16 10*3/uL — ABNORMAL HIGH (ref 4.0–10.5)
nRBC: 0 % (ref 0.0–0.2)

## 2023-11-03 LAB — COMPREHENSIVE METABOLIC PANEL
ALT: 32 U/L (ref 0–44)
AST: 20 U/L (ref 15–41)
Albumin: 4.1 g/dL (ref 3.5–5.0)
Alkaline Phosphatase: 59 U/L (ref 38–126)
Anion gap: 9 (ref 5–15)
BUN: 21 mg/dL — ABNORMAL HIGH (ref 6–20)
CO2: 26 mmol/L (ref 22–32)
Calcium: 9.8 mg/dL (ref 8.9–10.3)
Chloride: 103 mmol/L (ref 98–111)
Creatinine, Ser: 1.24 mg/dL (ref 0.61–1.24)
GFR, Estimated: >60 mL/min (ref >60)
Glucose, Bld: 248 mg/dL — ABNORMAL HIGH (ref 70–99)
Potassium: 4.1 mmol/L (ref 3.5–5.1)
Sodium: 138 mmol/L (ref 135–145)
Total Bilirubin: 1.1 mg/dL (ref 0.0–1.2)
Total Protein: 7.6 g/dL (ref 6.5–8.1)

## 2023-11-03 LAB — LIPASE, BLOOD: Lipase: 138 U/L — ABNORMAL HIGH (ref 11–51)

## 2023-11-03 LAB — CBG MONITORING, ED: Glucose-Capillary: 219 mg/dL — ABNORMAL HIGH (ref 70–99)

## 2023-11-03 NOTE — ED Triage Notes (Signed)
 The pt was  here Friday  for lt buttocks painful  he was given meds and he reports that since he started taking med  ordered for him  he can no longer unirate very much at a time and the pt has also stopped taking his other medicine and he is a diabetic  he has numbness in his lt entire leg now

## 2023-11-03 NOTE — ED Notes (Signed)
 Pt not seen in lobby and no answer when called.

## 2023-11-04 ENCOUNTER — Ambulatory Visit: Payer: Self-pay | Admitting: Internal Medicine

## 2023-11-04 ENCOUNTER — Telehealth: Payer: Self-pay | Admitting: *Deleted

## 2023-11-04 ENCOUNTER — Encounter: Payer: Self-pay | Admitting: Family Medicine

## 2023-11-04 ENCOUNTER — Ambulatory Visit: Admitting: Family Medicine

## 2023-11-04 ENCOUNTER — Ambulatory Visit (INDEPENDENT_AMBULATORY_CARE_PROVIDER_SITE_OTHER)
Admission: RE | Admit: 2023-11-04 | Discharge: 2023-11-04 | Disposition: A | Discharge status: Home / Self Care | Source: Ambulatory Visit | Attending: Family Medicine | Admitting: Family Medicine

## 2023-11-04 VITALS — BP 160/98 | HR 84 | Temp 98.1°F | Ht 70.0 in | Wt 250.5 lb

## 2023-11-04 DIAGNOSIS — I10 Essential (primary) hypertension: Secondary | ICD-10-CM | POA: Diagnosis not present

## 2023-11-04 DIAGNOSIS — M7918 Myalgia, other site: Secondary | ICD-10-CM

## 2023-11-04 NOTE — Progress Notes (Signed)
 Subjective:    Patient ID: Michael Willis, male    DOB: Aug 26, 1975, 48 y.o.   MRN: [id number]  HPI  Wt Readings from Last 3 Encounters:  11/04/23 250 lb 8 oz (113.6 kg)  11/03/23 250 lb (113.4 kg)  11/01/23 250 lb (113.4 kg)   35.94 kg/m  Vitals:   11/04/23 1207  BP: (!) 160/98  Pulse: 84  Temp: 98.1 F (36.7 C)  SpO2: 96%    48 yo pt of Dr Drue Novel presents for left buttock pain radiating to leg    Was seen in ER at [facility] on 3/14 At that time 4 days of symptoms  Positional  Thought to be msk in origin Was treated with toradol, tylenol, oxycodone and valium   Medrol dose pack and robaxin are on med list    Went back yesterday  Left without being seen    Lab Results  Component Value Date   NA 138 11/03/2023   K 4.1 11/03/2023   CO2 26 11/03/2023   GLUCOSE 248 (H) 11/03/2023   BUN 21 (H) 11/03/2023   CREATININE 1.24 11/03/2023   CALCIUM 9.8 11/03/2023   GFR 62.97 05/08/2023   GFRNONAA >60 11/03/2023   Lab Results  Component Value Date   ALT 32 11/03/2023   AST 20 11/03/2023   ALKPHOS 59 11/03/2023   BILITOT 1.1 11/03/2023   Lab Results  Component Value Date   WBC 16.0 (H) 11/03/2023   HGB 14.8 11/03/2023   HCT 44.3 11/03/2023   MCV 86.0 11/03/2023   PLT 256 11/03/2023  Urinalysis with large leuk / no blood, few bact and high glucose   Lab Results  Component Value Date   LIPASE 138 (H) 11/03/2023   He really does not know what medicine he is on   No abd pain  No bladder pain  No dysuria    ? Jardiance for diabetes    Today  Pain is in buttock (not the back) Radiates down back of leg  Feels like a pulled muscle  Now feels like a numb sensation  No bowel or bladder incontinence  No strength changes   Worse with sitting  Better to kneel   Constipated since the oxycodone   No trauma  He walks for exercise  No recent heavy lifting    Xray today  DG Lumbar Spine 2-3 Views Result Date: 11/04/2023 CLINICAL DATA:  Left sacroiliac pain  radiating to foot EXAM: LUMBAR SPINE - 2-3 VIEW COMPARISON:  None Available. FINDINGS: Frontal and lateral views of the lumbar spine are obtained. Five non-rib-bearing lumbar type vertebral bodies will be assumed for numbering purposes. There is mild spondylosis at T12-L1 and L5-S1, with lower lumbar facet hypertrophy greatest at L5-S1. No acute fractures. Sacroiliac joints are unremarkable. Visualized portions of the pelvis are normal. On the lateral view there is a 1.2 cm linear metallic foreign body, likely a surgical clip. This is not seen on the frontal view, this may be within the abdominal wall. Please correlate with prior surgical history. IMPRESSION: 1. Mild degenerative changes at the thoracolumbar and lumbosacral junctions as above. No acute fracture. Electronically Signed   By: Sharlet Salina M.D.   On: 11/04/2023 14:58   DG Sacrum/Coccyx Result Date: 11/04/2023 CLINICAL DATA:  Left buttock pain radiating down left leg EXAM: SACRUM AND COCCYX - 2+ VIEW COMPARISON:  None Available. FINDINGS: Pelvic inlet, pelvic outlet, and lateral views of the sacrum and coccyx are obtained. There are no acute or destructive  bony abnormalities. The sacroiliac joints are unremarkable. Visualized portions of the bony pelvis and bilateral hips are unremarkable. There is mild spondylosis and facet hypertrophy at the L5-S1 level. IMPRESSION: 1. Lower lumbar spondylosis and facet hypertrophy. 2. No acute bony abnormality. Electronically Signed   By: Sharlet Salina M.D.   On: 11/04/2023 14:53     Patient Active Problem List   Diagnosis Date Noted   Left buttock pain 11/04/2023   Chronic systolic heart failure (HCC) 02/11/2023   Acute heart failure (HCC) 01/29/2023   Acute systolic heart failure (HCC) 01/28/2023   Hypertensive urgency 01/28/2023   History of exposure to blood or body fluid 02/05/2020   Diabetes mellitus without complication (HCC) 12/04/2018   Swelling of right eyelid 02/04/2018   Essential  hypertension    PCP NOTES >>>>>>>>>>>>>>>>>>>>.Marland Kitchen 12/14/2015   Hyperlipidemia 12/04/2013   Elevated LFTs 01/05/2013   Annual physical exam 12/12/2012   OSA (obstructive sleep apnea) 12/12/2012   HTN (hypertension) 12/12/2012   Past Medical History:  Diagnosis Date   Diabetes (HCC) 01/05/2013   Elevated LFTs 01/05/2013   HTN (hypertension) 12/12/2012   Hyperlipidemia 12/04/2013   Obesity    Tinea pedis, onychomycosis 01/05/2013   Past Surgical History:  Procedure Laterality Date   RIGHT/LEFT HEART CATH AND CORONARY ANGIOGRAPHY N/A 01/30/2023   Procedure: RIGHT/LEFT HEART CATH AND CORONARY ANGIOGRAPHY;  Surgeon: Dolores Patty, MD;  Location: [facility] INVASIVE CV LAB;  Service: Cardiovascular;  Laterality: N/A;   Social History   Tobacco Use   Smoking status: Former    Types: Cigars, Pipe    Quit date: 09/18/2000    Years since quitting: 23.1   Smokeless tobacco: Never   Tobacco comments:    smoke a few cigars off an on .   Substance Use Topics   Alcohol use: Yes    Comment: socially   Drug use: No   Family History  Problem Relation Age of Onset   CAD Mother 58       MI?   Diabetes Neg Hx    Stroke Neg Hx    Colon cancer Neg Hx    Prostate cancer Neg Hx    No Known Allergies Current Outpatient Medications on File Prior to Visit  Medication Sig Dispense Refill   atorvastatin (LIPITOR) 80 MG tablet Take 1 tablet by mouth once daily 30 tablet 5   blood glucose meter kit and supplies Dispense based on patient and insurance preference. Check blood sugar twice daily. DX: E11.9 1 each 0   carvedilol (COREG) 12.5 MG tablet Take 1 tablet (12.5 mg total) by mouth 2 (two) times daily with a meal. 60 tablet 6   diclofenac Sodium (VOLTAREN) 1 % GEL Apply 4 g topically 4 (four) times daily. 100 g 0   ezetimibe (ZETIA) 10 MG tablet Take 1 tablet (10 mg total) by mouth daily. 90 tablet 3   furosemide (LASIX) 20 MG tablet Take 1 tablet (20 mg total) by mouth daily as needed (weight gain 3  lbs in 1 day or 5 lbs in 1 week). 30 tablet 6   glucose blood test strip Check blood sugar three times daily 300 each 12   metFORMIN (GLUCOPHAGE-XR) 500 MG 24 hr tablet Take 2 tablets (1,000 mg total) by mouth 2 (two) times daily with a meal. 360 tablet 0   methocarbamol (ROBAXIN) 500 MG tablet Take 1 tablet (500 mg total) by mouth 2 (two) times daily. 10 tablet 0   methylPREDNISolone (MEDROL DOSEPAK) 4 MG  TBPK tablet Day 1: 8mg  before breakfast, 4 mg after lunch, 4 mg after supper, and 8 mg at bedtime Day 2: 4 mg before breakfast, 4 mg after lunch, 4 mg  after supper, and 8 mg  at bedtime Day 3:  4 mg  before breakfast, 4 mg  after lunch, 4 mg after supper, and 4 mg  at bedtime Day 4: 4 mg  before breakfast, 4 mg  after lunch, and 4 mg at bedtime Day 5: 4 mg  before breakfast and 4 mg at bedtime Day 6: 4 mg  before breakfast 1 each 0   spironolactone (ALDACTONE) 25 MG tablet Take 1 tablet (25 mg total) by mouth daily. 30 tablet 6   No current facility-administered medications on file prior to visit.    Review of Systems  Constitutional:  Negative for activity change, appetite change, fatigue, fever and unexpected weight change.  HENT:  Negative for congestion, rhinorrhea, sore throat and trouble swallowing.   Eyes:  Negative for pain, redness, itching and visual disturbance.  Respiratory:  Negative for cough, chest tightness, shortness of breath and wheezing.   Cardiovascular:  Negative for chest pain and palpitations.  Gastrointestinal:  Negative for abdominal pain, blood in stool, constipation, diarrhea and nausea.  Endocrine: Negative for cold intolerance, heat intolerance, polydipsia and polyuria.  Genitourinary:  Negative for difficulty urinating, dysuria, frequency and urgency.  Musculoskeletal:  Negative for arthralgias, joint swelling and myalgias.       Left buttock pain rad down leg   Skin:  Negative for pallor and rash.  Neurological:  Negative for dizziness, tremors, weakness,  numbness and headaches.       Tingling left leg Not totally numb Not weak   Hematological:  Negative for adenopathy. Does not bruise/bleed easily.  Psychiatric/Behavioral:  Negative for decreased concentration and dysphoric mood. The patient is not nervous/anxious.        Objective:   Physical Exam Constitutional:      General: He is not in acute distress.    Appearance: Normal appearance. He is obese. He is not ill-appearing.  Cardiovascular:     Rate and Rhythm: Normal rate and regular rhythm.  Pulmonary:     Effort: No respiratory distress.     Breath sounds: Normal breath sounds. No wheezing.  Abdominal:     Tenderness: There is no abdominal tenderness.     Comments: No suprapubic tenderness or fullness   No cva tenderness   Musculoskeletal:     Comments: No lumbar tenderess Tender in left SI joint area  Positive SLR on left  Normal rom of hip /some pain with int rotation and flexion  No foot drop or other neuro change   Pain worse with  Flex of spine over 40 deg Left side bend     Skin:    Coloration: Skin is not jaundiced.     Findings: No bruising, erythema, lesion or rash.  Neurological:     Mental Status: He is alert.     Sensory: No sensory deficit.     Motor: No weakness.     Coordination: Coordination normal.     Gait: Gait normal.     Deep Tendon Reflexes: Reflexes normal.  Psychiatric:        Mood and Affect: Mood normal.           Assessment & Plan:   Problem List Items Addressed This Visit       Cardiovascular and Mediastinum   HTN (hypertension)  Blood pressure is up today  Pt has not been taking his medicine  Also in pain  Also finishing steroid pack   Instructed to get back on regular meds Follow up with pcp planned         Other   Left buttock pain - Primary   Seen in ER on 3/14, and again for visit w/o being seen  Seems to be coming from left SI area and rad down leg/now some tingling Reviewed hospital records, lab  results and studies in detail    Taking medrol dose pack and robaxin A little improved but cannot get comfortable No red flags Reassuring exam  No signs and symptoms of cauda equina syndrome   Instructed to continue current medicines Trial of heat  Given handout on SI dysf and also sciatica rehab to try  Xr of LS and sacrum coccyx today:   noted some lower lumbar spondylosis and facet hypertrophy , no fractures   Call back and Er precautions noted in detail today    PT may be helpful -will see if he is open to this        Relevant Orders   DG Lumbar Spine 2-3 Views (Completed)   DG Sacrum/Coccyx (Completed)

## 2023-11-04 NOTE — Assessment & Plan Note (Addendum)
 Seen in ER on 3/14, and again for visit w/o being seen  Seems to be coming from left SI area and rad down leg/now some tingling Reviewed hospital records, lab results and studies in detail    Taking medrol dose pack and robaxin A little improved but cannot get comfortable No red flags Reassuring exam  No signs and symptoms of cauda equina syndrome   Instructed to continue current medicines Trial of heat  Given handout on SI dysf and also sciatica rehab to try  Xr of LS and sacrum coccyx today:   noted some lower lumbar spondylosis and facet hypertrophy , no fractures   Call back and Er precautions noted in detail today    PT may be helpful -will see if he is open to this

## 2023-11-04 NOTE — Patient Instructions (Signed)
 Xray of your low spine and sacroiliac area today   We will reach out with reach out to you   Use some gentle heat on the painful area for 10 minutes at a time   Finish the steroids  Continue muscle relaxer in case it helps a little bit    If symptoms suddenly worsen -go to the ER

## 2023-11-04 NOTE — Assessment & Plan Note (Signed)
 Blood pressure is up today  Pt has not been taking his medicine  Also in pain  Also finishing steroid pack   Instructed to get back on regular meds Follow up with pcp planned

## 2023-11-04 NOTE — Telephone Encounter (Signed)
 Voice mailbox not st up

## 2023-11-04 NOTE — Telephone Encounter (Signed)
  Chief Complaint: pain in buttock Symptoms: pain in buttock Frequency: about a week Pertinent Negatives: Patient denies chest pain,  Disposition: [] ED /[] Urgent Care (no appt availability in office) / [x] Appointment(In office/virtual)/ []  [facility]/ [] Home Care/ [] Refused Recommended Disposition /[] [facility]/ []  Follow-up with PCP Additional Notes: Patient states that he started having pain in buttock area last week and it has gotten worse and now has radiated to his left thigh, leg and foot.  Numbness has developed since Friday in left leg and foot.  Patient states that he has not had a bowel movement since Thursday. States the pain is a 9/10 but he is still able to walk.    Copied from CRM [id number]. Topic: Clinical - Red Word Triage >> Nov 04, 2023  8:37 AM Theodis Sato wrote: Red Word that prompted transfer to Nurse Triage: Patient hospitalized yesterday 3/17 and is still in extreme pain.  *Patients wife speaking* Reason for Disposition  [1] SEVERE pain (e.g., excruciating, unable to do any normal activities) AND [2] not improved after 2 hours of pain medicine  Answer Assessment - Initial Assessment Questions 1. ONSET: "When did the pain start?"      Last week 2. LOCATION: "Where is the pain located?"      Left buttock, thigh 3. PAIN: "How bad is the pain?"    (Scale 1-10; or mild, moderate, severe)   -  MILD (1-3): doesn't interfere with normal activities    -  MODERATE (4-7): interferes with normal activities (e.g., work or school) or awakens from sleep, limping    -  SEVERE (8-10): excruciating pain, unable to do any normal activities, unable to walk [place] and really sore, can walk 4. WORK OR EXERCISE: "Has there been any recent work or exercise that involved this part of the body?"      no 5. CAUSE: "What do you think is causing the leg pain?"     unknowm 6. OTHER SYMPTOMS: "Do you have any other symptoms?" (e.g., chest pain, back pain, breathing  difficulty, swelling, rash, fever, numbness, weakness)     Constipation,urination with cough, numbness in left leg and foot  and buttock area.  Protocols used: Leg Pain-A-AH

## 2023-11-04 NOTE — Telephone Encounter (Signed)
 Pt called and left message to have someone call him back.  RNCM returned call that went immediately to voice mail with a message that the voice mailbox had not been set up yet.  Gregg Holster J. Lucretia Roers, RN, BSN, NCM  Transitions of Care  Nurse Case Manager  [facility] Emergency Departments  Operative Services  [phone]

## 2023-11-13 ENCOUNTER — Encounter: Payer: Self-pay | Admitting: Internal Medicine

## 2023-11-13 ENCOUNTER — Ambulatory Visit: Admitting: Internal Medicine

## 2023-11-13 VITALS — BP 126/78 | HR 82 | Temp 97.9°F | Resp 18 | Ht 70.0 in | Wt 243.1 lb

## 2023-11-13 DIAGNOSIS — I5022 Chronic systolic (congestive) heart failure: Secondary | ICD-10-CM

## 2023-11-13 DIAGNOSIS — M5416 Radiculopathy, lumbar region: Secondary | ICD-10-CM | POA: Diagnosis not present

## 2023-11-13 DIAGNOSIS — Z7984 Long term (current) use of oral hypoglycemic drugs: Secondary | ICD-10-CM

## 2023-11-13 DIAGNOSIS — E1165 Type 2 diabetes mellitus with hyperglycemia: Secondary | ICD-10-CM

## 2023-11-13 DIAGNOSIS — Z91199 Patient's noncompliance with other medical treatment and regimen due to unspecified reason: Secondary | ICD-10-CM | POA: Diagnosis not present

## 2023-11-13 MED ORDER — GLUCOSE BLOOD VI STRP: ORAL_STRIP | 12 refills | Status: DC

## 2023-11-13 MED ORDER — EMPAGLIFLOZIN 10 MG PO TABS: 10.0000 mg | ORAL_TABLET | Freq: Every day | ORAL | 3 refills | Status: DC

## 2023-11-13 MED ORDER — BLOOD GLUCOSE METER KIT: PACK | 0 refills | Status: DC

## 2023-11-13 MED ORDER — GLIPIZIDE ER 10 MG PO TB24: 10.0000 mg | ORAL_TABLET | Freq: Every day | ORAL | 3 refills | Status: AC

## 2023-11-13 NOTE — Progress Notes (Unsigned)
 Subjective:    Patient ID: Michael Willis, male    DOB: August 12, 1976, 48 y.o.   MRN: [id number]  DOS:  11/13/2023 Type of visit - description: chief concern is buttock pain  ER 11/01/2023: Presented with L buttock pain for 4 days. Treated with Toradol, Tylenol, oxycodone and Valium. Subsequently Rx methylprednisolone and methocarbamol. No help Was seen by Dr. Milinda Antis 11/04/2023 Recommended conservative treatment, x-ray showed some DJD changes. At this point, pain is not severe but has developed numbness at the posterior aspect of the left leg up to the heel. Denies any back injury, fever or chills.  No motor deficits.  Review of Systems See above   Past Medical History:  Diagnosis Date   Diabetes (HCC) 01/05/2013   Elevated LFTs 01/05/2013   HTN (hypertension) 12/12/2012   Hyperlipidemia 12/04/2013   Obesity    Tinea pedis, onychomycosis 01/05/2013    Past Surgical History:  Procedure Laterality Date   RIGHT/LEFT HEART CATH AND CORONARY ANGIOGRAPHY N/A 01/30/2023   Procedure: RIGHT/LEFT HEART CATH AND CORONARY ANGIOGRAPHY;  Surgeon: Dolores Patty, MD;  Location: [facility];  Service: Cardiovascular;  Laterality: N/A;    Current Outpatient Medications  Medication Instructions   atorvastatin (LIPITOR) 80 mg, Oral, Daily   blood glucose meter kit and supplies Dispense based on patient and insurance preference. Check blood sugar twice daily. DX: E11.9   carvedilol (COREG) 12.5 mg, Oral, 2 times daily with meals   diclofenac Sodium (VOLTAREN) 4 g, Topical, 4 times daily   ezetimibe (ZETIA) 10 mg, Oral, Daily   furosemide (LASIX) 20 mg, Oral, Daily PRN   glucose blood test strip Check blood sugar three times daily   metFORMIN (GLUCOPHAGE-XR) 1,000 mg, Oral, 2 times daily with meals   methocarbamol (ROBAXIN) 500 mg, Oral, 2 times daily   methylPREDNISolone (MEDROL DOSEPAK) 4 MG TBPK tablet Day 1: 8mg  before breakfast, 4 mg after lunch, 4 mg after supper, and 8 mg at bedtime  Day 2: 4 mg before breakfast, 4 mg after lunch, 4 mg  after supper, and 8 mg  at bedtime Day 3:  4 mg  before breakfast, 4 mg  after lunch, 4 mg after supper, and 4 mg  at bedtime Day 4: 4 mg  before breakfast, 4 mg  after lunch, and 4 mg at bedtime Day 5: 4 mg  before breakfast and 4 mg at bedtime Day 6: 4 mg  before breakfast   spironolactone (ALDACTONE) 25 mg, Oral, Daily       Objective:   Physical Exam BP 126/78   Pulse 82   Temp 97.9 F (36.6 C) (Oral)   Resp 18   Ht 5\' 10"  (1.778 m)   Wt 243 lb 2 oz (110.3 kg)   SpO2 98%   BMI 34.88 kg/m  General:   Well developed, NAD, BMI noted. HEENT:  Normocephalic . Face symmetric, atraumatic Lungs:  CTA B Normal respiratory effort, no intercostal retractions, no accessory muscle use. Heart: RRR,  no murmur.  Lower extremities: no pretibial edema bilaterally  Skin: Not pale. Not jaundice Neurologic:  alert & oriented X3.  Speech normal, gait appropriate for age and unassisted. Motor symmetric, DTR symmetric. Psych--  Cognition and judgment appear intact.  Cooperative with normal attention span and concentration.  Behavior appropriate. No anxious or depressed appearing.      Assessment    Problem list:  (reestablish 03/2023) DM 2014 HTN Hyperlipidemia Elevated LFTs Chronic systolic heart failure Morbid obesity Onychomycosis Snoring:  suspect OSA, saw pulmonary 2014, did not purse sleep study   PLAN: L radiculopathy: Developed left buttock pain and now has numbness on the left leg, will refer to orthopedics, recommend not to take more steroids.   DM: Seen last visit, A1c was 10.0, was recommended to increase metformin. The patient states that he "will not take metformin, I do not like it, my wife does not like me to take it".  Reports that in the past he has a number of side effects. He is surprised because he is eating healthier and losing weight and yet her A1c is worse. I had a long conversation with the patient  regards diabetes, it is a progressive disease, he is losing weight most likely because hyperglycemia and glucosuria.  I explained this and the most simple way possible, he verbalized understanding.  I also explained the risk of elevated blood sugar including renal disease, blindness, amputations, CAD, strokes. Plan: Glucotrol XL 10 mg, Jardiance 10 mg, check CBGs, goals provided. Rerefer to Endo.  He lives towards [place] so work on a second to a Doctor, general practice Come back in 3 months.     HTN: BP was elevated few days ago when he was seen at another office, BP today satisfactory.  Chronic systolic heart failure: He canceled the cardiology visit, again he was supposed to be on Entresto and Jardiance but I do not see that on his medication list.  We are prescribing Jardiance today, encouraged to call cardiology and reset his appointment. Compliance: Is a major issue, I am trying to educate him about the importance of taking medication to the best of my ability. RTC 2 months

## 2023-11-13 NOTE — Patient Instructions (Addendum)
 I am sending to a prescription to help you with diabetes: Glucotrol XL 10 mg: 1 tablet in the morning. Jardiance 10 mg 1 tablet in the morning  Diabetes: You can check your sugars  - early in AM fasting  ( blood sugar goal 70-130) - 2 hours after a meal (blood sugar goal less than 180)   Watch for low sugar symptoms.  See below.  Will refer you to the orthopedic doctor for back pain  Will refer you to the endocrinologist to help as with your diabetes  Call cardiology and reset your appointment.  [phone]  Come back in 2 months.  Please make an appointment   Low sugar symptoms Hypoglycemia is when the amount of sugar, or glucose, in your blood is too low. Low blood sugar can happen if you have diabetes or if you don't have diabetes. It may be an emergency. What are the causes? Low blood sugar happens most often in people who have diabetes. It may be caused by: Diabetes medicine. Not eating enough, or not eating often enough. Being more active than normal. If you don't have diabetes, you may still get low blood sugar if: There's a tumor in your pancreas. A tumor is a growth of cells that isn't normal. You don't eat enough, or you fast. Fasting is when you don't eat for long periods at a time. You have a bad infection or illness. You have problems after weight loss surgery. You have kidney or liver problems. You take certain medicines. What increases the risk? You're more likely to have low blood sugar if: You have diabetes and take medicine for it. You drink a lot of alcohol. You get sick. What are the signs or symptoms? Mild Hunger or feeling like you may vomit. Sweating and feeling cold to the touch. Feeling dizzy or light-headed. Being sleepy or having trouble sleeping. A headache. Blurry vision. Mood changes. These include feeling worried, nervous, or easily annoyed. Moderate Feeling confused. Changes in the way you act. Weakness. An uneven heartbeat. Very  bad Having very low blood sugar is an emergency. It can cause: Fainting. Seizures. A coma. Death. How is this diagnosed?  Low blood sugar can be found with a blood test. This test tells you how much sugar is in your blood. It's done while you're having symptoms. Your health care provider may also do an exam and look at your medical history. How is this treated? Treating low blood sugar If you have low blood sugar, eat or drink something with sugar in it right away. The food or drink should have 15 grams of a fast-acting carbohydrate (carb). Options include: 4 oz (120 mL) of fruit juice. 4 oz (120 mL) of soda (not diet soda). A few pieces of hard candy. Check food labels to see how many pieces to eat. 1 Tbsp (15 mL) of sugar or honey. 4 glucose tablets. 1 tube of glucose gel. Treating low blood sugar if you have diabetes Talk with your provider about how much carb you should take. If you're alert and can swallow safely, you may follow the 15:15 rule: Take 15 grams of a fast-acting carb. Check your blood sugar 15 minutes after you take the carb. If your blood sugar is still at or below 70 mg/dL (3.9 mmol/L), take 15 grams of a carb again. If your blood sugar doesn't go above 70 mg/dL (3.9 mmol/L) after 3 tries, get help right away. After your blood sugar goes back to normal, eat a  meal or a snack within 1 hour. Always keep 15 grams of a fast-acting carb with you. This could be: 4 glucose tablets. A few pieces of hard candy. 1 Tbsp (15 mL) of honey or sugar. 1 tube of glucose gel. Treating very low blood sugar If your blood sugar is less than 54 mg/dL (3 mmol/L), it's an emergency. Get help right away. If you can't eat or drink, you will need to be given glucagon. A family member or friend should learn how to check your blood sugar and give you glucagon. Ask your provider if you should keep a glucagon kit at home. You may also need to be treated in a hospital. Follow these  instructions at home: If you have diabetes: Always keep a fast-acting carb (15 grams) with you. Follow your diabetes care plan. Make sure you: Know the symptoms of low blood sugar. Check your blood sugar as often as told. Always check it before and after you exercise. Always check your blood sugar before you drive. Take your medicines as told. Eat on time. Do not skip meals. Share your diabetes care plan with: Your work or school. The people you live with. Wear an alert bracelet or carry a card that says you have diabetes. General instructions If you drink alcohol: Limit how much you have to: 0-1 drink a day if you're male. 0-2 drinks a day if you're male. Know how much alcohol is in your drink. In the [place] one drink is one 12 oz bottle of beer (355 mL), one 5 oz glass of wine (148 mL), or one 1 oz glass of hard liquor (44 mL). Be sure to eat food when you drink alcohol. Be sure to check your blood sugar after you drink. Alcohol may lead to low blood sugar later. Where to find more information American Diabetes Association (ADA): diabetes.org Contact a health care provider if: You have low blood sugar often. You have diabetes and are having trouble keeping your blood sugar in the right range. Get help right away if: You can't get your blood sugar above 70 mg/dL (3.9 mmol/L) after 3 tries. Your blood sugar is below 54 mg/dL (3 mmol/L). You have a seizure. You faint. These symptoms may be an emergency. Call 911 right away. Do not wait to see if the symptoms will go away. Do not drive yourself to the hospital. This information is not intended to replace advice given to you by your health care provider. Make sure you discuss any questions you have with your health care provider. Document Revised: 05/09/2023 Document Reviewed: 10/24/2022 Elsevier Patient Education  2024 [employer].

## 2023-11-14 NOTE — Assessment & Plan Note (Signed)
 L radiculopathy: Developed left buttock pain and now has numbness on the left leg, will refer to orthopedics, recommend not to take more steroids. DM: Seen last visit, A1c was 10.0, was recommended to increase metformin. The patient states that he "will not take metformin, I do not like it, my wife does not like me to take it".  Reports that in the past he has a number of side effects. He is surprised because he is eating healthier and losing weight and yet his A1c is worse. I had a long conversation with the patient regards diabetes: it is a progressive disease, he is losing weight most likely because hyperglycemia and glucosuria.  I explained this in the most simple way possible, he verbalized understanding.  I also explained the risk of elevated blood sugar including renal disease, blindness, amputations, CAD, strokes. Plan: Glucotrol XL 10 mg, Jardiance 10 mg, check CBGs, goals provided. Rerefer to Endo (to a practice near [place] when he leaves). Recommend to check CBGs, goals provided.  Reassess in 2  months. HTN: BP was elevated few days ago when he was seen at another office, BP today satisfactory. Chronic systolic heart failure: He canceled the cardiology visit, again he was supposed to be on Entresto and Jardiance but I do not see that on his medication list.  We are prescribing Jardiance today, encouraged to call cardiology and reset his appointment. Compliance: poor compliance is a major issue, I am trying to educate him about the importance of taking medication   RTC 2 months

## 2023-11-15 ENCOUNTER — Inpatient Hospital Stay: Admitting: Internal Medicine

## 2023-11-25 ENCOUNTER — Encounter (HOSPITAL_BASED_OUTPATIENT_CLINIC_OR_DEPARTMENT_OTHER): Payer: Self-pay | Admitting: Emergency Medicine

## 2023-11-25 ENCOUNTER — Observation Stay (HOSPITAL_BASED_OUTPATIENT_CLINIC_OR_DEPARTMENT_OTHER)
Admission: EM | Admit: 2023-11-25 | Discharge: 2023-11-27 | Disposition: A | Discharge status: Home / Self Care | Attending: Internal Medicine | Admitting: Internal Medicine

## 2023-11-25 ENCOUNTER — Other Ambulatory Visit: Payer: Self-pay

## 2023-11-25 ENCOUNTER — Emergency Department (HOSPITAL_COMMUNITY)

## 2023-11-25 DIAGNOSIS — D649 Anemia, unspecified: Secondary | ICD-10-CM | POA: Diagnosis not present

## 2023-11-25 DIAGNOSIS — N1831 Chronic kidney disease, stage 3a: Secondary | ICD-10-CM | POA: Diagnosis not present

## 2023-11-25 DIAGNOSIS — M5416 Radiculopathy, lumbar region: Principal | ICD-10-CM | POA: Insufficient documentation

## 2023-11-25 DIAGNOSIS — I130 Hypertensive heart and chronic kidney disease with heart failure and stage 1 through stage 4 chronic kidney disease, or unspecified chronic kidney disease: Secondary | ICD-10-CM | POA: Insufficient documentation

## 2023-11-25 DIAGNOSIS — Z79899 Other long term (current) drug therapy: Secondary | ICD-10-CM | POA: Diagnosis not present

## 2023-11-25 DIAGNOSIS — I5032 Chronic diastolic (congestive) heart failure: Secondary | ICD-10-CM | POA: Insufficient documentation

## 2023-11-25 DIAGNOSIS — R6510 Systemic inflammatory response syndrome (SIRS) of non-infectious origin without acute organ dysfunction: Secondary | ICD-10-CM | POA: Diagnosis not present

## 2023-11-25 DIAGNOSIS — E119 Type 2 diabetes mellitus without complications: Secondary | ICD-10-CM | POA: Diagnosis not present

## 2023-11-25 DIAGNOSIS — I10 Essential (primary) hypertension: Secondary | ICD-10-CM | POA: Diagnosis present

## 2023-11-25 DIAGNOSIS — E1122 Type 2 diabetes mellitus with diabetic chronic kidney disease: Secondary | ICD-10-CM | POA: Diagnosis not present

## 2023-11-25 DIAGNOSIS — M545 Low back pain, unspecified: Secondary | ICD-10-CM | POA: Diagnosis present

## 2023-11-25 DIAGNOSIS — E785 Hyperlipidemia, unspecified: Secondary | ICD-10-CM | POA: Diagnosis not present

## 2023-11-25 DIAGNOSIS — D72829 Elevated white blood cell count, unspecified: Secondary | ICD-10-CM | POA: Diagnosis not present

## 2023-11-25 DIAGNOSIS — E782 Mixed hyperlipidemia: Secondary | ICD-10-CM

## 2023-11-25 DIAGNOSIS — M5442 Lumbago with sciatica, left side: Secondary | ICD-10-CM

## 2023-11-25 DIAGNOSIS — Z87891 Personal history of nicotine dependence: Secondary | ICD-10-CM | POA: Diagnosis not present

## 2023-11-25 DIAGNOSIS — D631 Anemia in chronic kidney disease: Secondary | ICD-10-CM | POA: Insufficient documentation

## 2023-11-25 DIAGNOSIS — R509 Fever, unspecified: Secondary | ICD-10-CM | POA: Insufficient documentation

## 2023-11-25 LAB — COMPREHENSIVE METABOLIC PANEL WITH GFR
ALT: 28 U/L (ref 0–44)
AST: 27 U/L (ref 15–41)
Albumin: 3 g/dL — ABNORMAL LOW (ref 3.5–5.0)
Alkaline Phosphatase: 58 U/L (ref 38–126)
Anion gap: 12 (ref 5–15)
BUN: 29 mg/dL — ABNORMAL HIGH (ref 6–20)
CO2: 23 mmol/L (ref 22–32)
Calcium: 8.8 mg/dL — ABNORMAL LOW (ref 8.9–10.3)
Chloride: 101 mmol/L (ref 98–111)
Creatinine, Ser: 1.68 mg/dL — ABNORMAL HIGH (ref 0.61–1.24)
GFR, Estimated: 50 mL/min — ABNORMAL LOW (ref >60)
Glucose, Bld: 219 mg/dL — ABNORMAL HIGH (ref 70–99)
Potassium: 3.8 mmol/L (ref 3.5–5.1)
Sodium: 136 mmol/L (ref 135–145)
Total Bilirubin: 1.9 mg/dL — ABNORMAL HIGH (ref 0.0–1.2)
Total Protein: 7.2 g/dL (ref 6.5–8.1)

## 2023-11-25 LAB — RETICULOCYTES
Immature Retic Fract: 11.8 % (ref 2.3–15.9)
RBC.: 2.53 MIL/uL — ABNORMAL LOW (ref 4.22–5.81)
Retic Count, Absolute: 40.5 10*3/uL (ref 19.0–186.0)
Retic Ct Pct: 1.6 % (ref 0.4–3.1)

## 2023-11-25 LAB — CBC WITH DIFFERENTIAL/PLATELET
Abs Immature Granulocytes: 0.15 10*3/uL — ABNORMAL HIGH (ref 0.00–0.07)
Basophils Absolute: 0 10*3/uL (ref 0.0–0.1)
Basophils Relative: 0 %
Eosinophils Absolute: 0 10*3/uL (ref 0.0–0.5)
Eosinophils Relative: 0 %
HCT: 21.8 % — ABNORMAL LOW (ref 39.0–52.0)
Hemoglobin: 7.4 g/dL — ABNORMAL LOW (ref 13.0–17.0)
Immature Granulocytes: 1 %
Lymphocytes Relative: 12 %
Lymphs Abs: 3 10*3/uL (ref 0.7–4.0)
MCH: 29.2 pg (ref 26.0–34.0)
MCHC: 33.9 g/dL (ref 30.0–36.0)
MCV: 86.2 fL (ref 80.0–100.0)
Monocytes Absolute: 2.9 10*3/uL — ABNORMAL HIGH (ref 0.1–1.0)
Monocytes Relative: 12 %
Neutro Abs: 18.7 10*3/uL — ABNORMAL HIGH (ref 1.7–7.7)
Neutrophils Relative %: 75 %
Platelets: 329 10*3/uL (ref 150–400)
RBC: 2.53 MIL/uL — ABNORMAL LOW (ref 4.22–5.81)
RDW: 11.9 % (ref 11.5–15.5)
WBC: 24.7 10*3/uL — ABNORMAL HIGH (ref 4.0–10.5)
nRBC: 0 % (ref 0.0–0.2)

## 2023-11-25 LAB — MAGNESIUM: Magnesium: 2.3 mg/dL (ref 1.7–2.4)

## 2023-11-25 LAB — RESP PANEL BY RT-PCR (RSV, FLU A&B, COVID)  RVPGX2
Influenza A by PCR: NEGATIVE
Influenza B by PCR: NEGATIVE
Resp Syncytial Virus by PCR: NEGATIVE
SARS Coronavirus 2 by RT PCR: NEGATIVE

## 2023-11-25 LAB — FOLATE: Folate: 7.3 ng/mL (ref >5.9)

## 2023-11-25 LAB — I-STAT CG4 LACTIC ACID, ED: Lactic Acid, Venous: 0.9 mmol/L (ref 0.5–1.9)

## 2023-11-25 LAB — POC OCCULT BLOOD, ED: Fecal Occult Bld: NEGATIVE

## 2023-11-25 MED ORDER — ACETAMINOPHEN 500 MG PO TABS
1000.0000 mg | ORAL_TABLET | Freq: Once | ORAL | Status: AC
  Administered 2023-11-25: 1000 mg via ORAL
  Filled 2023-11-25: qty 2

## 2023-11-25 MED ORDER — LOPERAMIDE HCL 2 MG PO CAPS
4.0000 mg | ORAL_CAPSULE | Freq: Once | ORAL | Status: AC
  Administered 2023-11-25: 4 mg via ORAL
  Filled 2023-11-25: qty 2

## 2023-11-25 MED ORDER — FENTANYL CITRATE PF 50 MCG/ML IJ SOSY
50.0000 ug | PREFILLED_SYRINGE | Freq: Once | INTRAMUSCULAR | Status: AC
  Administered 2023-11-25: 50 ug via INTRAVENOUS
  Filled 2023-11-25: qty 1

## 2023-11-25 MED ORDER — FENTANYL CITRATE PF 50 MCG/ML IJ SOSY
25.0000 ug | PREFILLED_SYRINGE | INTRAMUSCULAR | Status: DC | PRN
  Administered 2023-11-25 – 2023-11-27 (×4): 25 ug via INTRAVENOUS
  Filled 2023-11-25 (×5): qty 1

## 2023-11-25 MED ORDER — ACETAMINOPHEN 325 MG PO TABS
650.0000 mg | ORAL_TABLET | Freq: Four times a day (QID) | ORAL | Status: DC | PRN
  Administered 2023-11-26: 650 mg via ORAL
  Filled 2023-11-25: qty 2

## 2023-11-25 MED ORDER — ONDANSETRON HCL 4 MG/2ML IJ SOLN: 4.0000 mg | Freq: Four times a day (QID) | INTRAMUSCULAR | Status: DC | PRN

## 2023-11-25 MED ORDER — LACTATED RINGERS IV BOLUS
1000.0000 mL | Freq: Once | INTRAVENOUS | Status: AC
  Administered 2023-11-25: 1000 mL via INTRAVENOUS

## 2023-11-25 MED ORDER — NALOXONE HCL 0.4 MG/ML IJ SOLN: 0.4000 mg | INTRAMUSCULAR | Status: DC | PRN

## 2023-11-25 MED ORDER — MELATONIN 3 MG PO TABS
3.0000 mg | ORAL_TABLET | Freq: Every evening | ORAL | Status: DC | PRN
  Administered 2023-11-25: 3 mg via ORAL
  Filled 2023-11-25 (×2): qty 1

## 2023-11-25 MED ORDER — LIDOCAINE 5 % EX PTCH
1.0000 | MEDICATED_PATCH | CUTANEOUS | Status: DC
  Administered 2023-11-26: 1 via TRANSDERMAL
  Filled 2023-11-25 (×2): qty 1

## 2023-11-25 MED ORDER — ACETAMINOPHEN 650 MG RE SUPP: 650.0000 mg | Freq: Four times a day (QID) | RECTAL | Status: DC | PRN

## 2023-11-25 NOTE — ED Provider Notes (Signed)
 [place] EMERGENCY DEPARTMENT AT [facility] Provider Note   CSN: [id number] Arrival date & time: 11/25/23  1328     History  Chief Complaint  Patient presents with   Leg Pain    Michael Willis is a 48 y.o. male.  Patient with history of congestive heart failure, chronic kidney disease, diabetes, hyperlipidemia--presents to the emergency department today for evaluation of continued low back pain with radiation to the left leg.  Patient has been seen for this previously in the emergency department on 11/01/23 and had follow-up with PCP 3/17 and 3/26.  Patient was placed on oral steroid Dosepak, muscle relaxers.  He is given a shot of IM Toradol.  Try to avoid oral NSAIDs due to chronic kidney disease and heart failure history.  At last PCP visit, patient was referred to orthopedics.  He states that orthopedics called back but he did not schedule an appointment.  Over the past several days he has been having difficulty ambulating but is able to ambulate, mainly due to pain.  He reports numbness down his leg as well as numbness in the rectal area and scrotal area.  He states that it is difficult for him to understand when he needs to use the restroom and did have fecal incontinence yesterday for the first time.      Home Medications Prior to Admission medications   Medication Sig Start Date End Date Taking? Authorizing Provider  atorvastatin (LIPITOR) 80 MG tablet Take 1 tablet by mouth once daily 05/30/23   Bensimhon, Bevelyn Buckles, MD  blood glucose meter kit and supplies Dispense based on patient and insurance preference. Check blood sugar twice daily. DX: E11.9 11/13/23   Wanda Plump, MD  carvedilol (COREG) 12.5 MG tablet Take 1 tablet (12.5 mg total) by mouth 2 (two) times daily with a meal. 05/23/23   Bensimhon, Bevelyn Buckles, MD  diclofenac Sodium (VOLTAREN) 1 % GEL Apply 4 g topically 4 (four) times daily. 11/01/23   Melene Plan, DO  empagliflozin (JARDIANCE) 10 MG TABS tablet Take 1  tablet (10 mg total) by mouth daily before breakfast. 11/13/23   Wanda Plump, MD  ezetimibe (ZETIA) 10 MG tablet Take 1 tablet (10 mg total) by mouth daily. 05/23/23   Bensimhon, Bevelyn Buckles, MD  furosemide (LASIX) 20 MG tablet Take 1 tablet (20 mg total) by mouth daily as needed (weight gain 3 lbs in 1 day or 5 lbs in 1 week). 05/23/23   Bensimhon, Bevelyn Buckles, MD  glipiZIDE (GLUCOTROL XL) 10 MG 24 hr tablet Take 1 tablet (10 mg total) by mouth daily with breakfast. 11/13/23   Wanda Plump, MD  glucose blood test strip Check blood sugar twice daily 11/13/23   Wanda Plump, MD  methocarbamol (ROBAXIN) 500 MG tablet Take 1 tablet (500 mg total) by mouth 2 (two) times daily. Patient not taking: Reported on 11/13/2023 11/01/23   Melene Plan, DO  spironolactone (ALDACTONE) 25 MG tablet Take 1 tablet (25 mg total) by mouth daily. 05/23/23   Bensimhon, Bevelyn Buckles, MD      Allergies    Patient has no known allergies.    Review of Systems   Review of Systems  Physical Exam Updated Vital Signs BP (!) 141/77   Pulse (!) 103   Temp 98.4 F (36.9 C) (Oral)   Resp 16   Ht 5\' 10"  (1.778 m)   Wt 110 kg   SpO2 99%   BMI 34.80 kg/m   Physical  Exam Vitals and nursing note reviewed.  Constitutional:      Appearance: He is well-developed.  HENT:     Head: Normocephalic and atraumatic.  Eyes:     Conjunctiva/sclera: Conjunctivae normal.  Abdominal:     Palpations: Abdomen is soft.     Tenderness: There is no abdominal tenderness. There is no right CVA tenderness or left CVA tenderness.  Musculoskeletal:        General: No tenderness. Normal range of motion.     Cervical back: Normal range of motion.     Comments: No step-off noted with palpation of spine.   Skin:    General: Skin is warm and dry.  Neurological:     General: No focal deficit present.     Mental Status: He is alert.     Sensory: Sensory deficit present.     Motor: No abnormal muscle tone.     Deep Tendon Reflexes: Reflexes are normal and  symmetric.     Comments: 5/5 strength in entire lower extremities bilaterally except for 4/5 strength plantarflexion of the left foot.  Psychiatric:        Mood and Affect: Mood normal.     ED Results / Procedures / Treatments   Labs (all labs ordered are listed, but only abnormal results are displayed) Labs Reviewed - No data to display  EKG None  Radiology No results found.  Procedures Procedures    Medications Ordered in ED Medications - No data to display  ED Course/ Medical Decision Making/ A&P    Patient seen and examined. History obtained directly from patient.  I reviewed previous ED notes as well as PCP notes.  Labs/EKG: None ordered  Imaging: MRI lumbar spine without contrast  Medications/Fluids: None ordered  Most recent vital signs reviewed and are as follows: BP (!) 141/77   Pulse (!) 103   Temp 98.4 F (36.9 C) (Oral)   Resp 16   Ht 5\' 10"  (1.778 m)   Wt 110 kg   SpO2 99%   BMI 34.80 kg/m   Initial impression: Lumbar radiculopathy with some alarm symptoms including incontinence and saddle paresthesias.  Discussed with patient that concern would be to rule out cauda equina syndrome and differentiate between that and lumbar radiculopathy.  I cannot do this with imaging available at this facility.  Discussed that given progressive nature of symptoms, alarm symptoms, would refer to MRI.  Discussed that this would have to be done at [facility] or [facility].  Would advise [facility] as he would have ready access to neurosurgical intervention if needed.  Patient and family member raised concerned about wait times at that facility.  However, they are willing to go.  Discussed transport by private vehicle versus ambulance.  They prefer and are willing to go by private vehicle.  Feel member has no concerns.  Dr. Rush Landmark at [facility] accepts patient for transfer.  Patient ambulatory at time of transfer.  Note to next team: MRI has been ordered.  Patient  may need lab work given chronic comorbidities if he has any emergent findings, esp blood sugar if steroids are indicated.  If symptoms felt to be radicular in nature, can likely follow-up as outpatient.  Patient reports significant difficulty walking, however is able to ambulate without assistance here.                                  Medical  Decision Making Amount and/or Complexity of Data Reviewed Radiology: ordered.   Patient with ongoing lower back pain for about 4 to 6 weeks with radiation to the leg.  Patient has had progressive sensory deficits and seems to have a bit of weakness with plantarflexion as well.  He reports worsening numbness up into the perineal area and urinary and fecal incontinence which raise concern for cauda equina syndrome.  For this reason he is referred for emergent MRI.        Final Clinical Impression(s) / ED Diagnoses Final diagnoses:  Lumbar radiculopathy    Rx / DC Orders ED Discharge Orders     None         Renne Crigler, PA-C 11/25/23 1422    Lonell Grandchild, MD 11/26/23 [id number]

## 2023-11-25 NOTE — ED Provider Notes (Signed)
 [place] EMERGENCY DEPARTMENT AT [facility] Provider Note   CSN: [id number] Arrival date & time: 11/25/23  1328     History  Chief Complaint  Patient presents with   Leg Pain    Michael Willis is a 48 y.o. male congestive heart failure, chronic kidney disease, diabetes, hyperlipidemia presents as a transfer due to concern for spinal cord impingement.  Patient states that he has been having left buttock pain that is rating down his left leg for over a month now.  He had been taking a Medrol  Dosepak and received a Toradol  shot, but continues to endorse worsening pain.  He states that over the last 2 to 3 weeks, he has had bowel and bladder incontinence.  Denies any IV drug use.  Denies any blood thinner use.  Denies any traumatic injuries to his back.  He was seen at OSH, transferred here for MRI.  Patient denies any cough, congestion, runny nose, shortness of breath.  Also denies any melena or hematochezia.   Leg Pain      Home Medications Prior to Admission medications   Medication Sig Start Date End Date Taking? Authorizing Provider  atorvastatin  (LIPITOR ) 80 MG tablet Take 1 tablet by mouth once daily 05/30/23   Bensimhon, Rheta Celestine, MD  blood glucose meter kit and supplies Dispense based on patient and insurance preference. Check blood sugar twice daily. DX: E11.9 11/13/23   Ezell Hollow, MD  carvedilol  (COREG ) 12.5 MG tablet Take 1 tablet (12.5 mg total) by mouth 2 (two) times daily with a meal. 05/23/23   Bensimhon, Rheta Celestine, MD  diclofenac  Sodium (VOLTAREN ) 1 % GEL Apply 4 g topically 4 (four) times daily. 11/01/23   Albertus Hughs, DO  empagliflozin  (JARDIANCE ) 10 MG TABS tablet Take 1 tablet (10 mg total) by mouth daily before breakfast. 11/13/23   Paz, Jose E, MD  ezetimibe  (ZETIA ) 10 MG tablet Take 1 tablet (10 mg total) by mouth daily. 05/23/23   Bensimhon, Rheta Celestine, MD  furosemide  (LASIX ) 20 MG tablet Take 1 tablet (20 mg total) by mouth daily as needed (weight gain 3  lbs in 1 day or 5 lbs in 1 week). 05/23/23   Bensimhon, Rheta Celestine, MD  glipiZIDE  (GLUCOTROL  XL) 10 MG 24 hr tablet Take 1 tablet (10 mg total) by mouth daily with breakfast. 11/13/23   Paz, Jose E, MD  glucose blood test strip Check blood sugar twice daily 11/13/23   Paz, Jose E, MD  methocarbamol  (ROBAXIN ) 500 MG tablet Take 1 tablet (500 mg total) by mouth 2 (two) times daily. Patient not taking: Reported on 11/13/2023 11/01/23   Albertus Hughs, DO  spironolactone  (ALDACTONE ) 25 MG tablet Take 1 tablet (25 mg total) by mouth daily. 05/23/23   Bensimhon, Rheta Celestine, MD      Allergies    Patient has no known allergies.    Review of Systems   Review of Systems  Physical Exam Updated Vital Signs BP 134/85   Pulse 100   Temp (!) 102.3 F (39.1 C) (Rectal)   Resp 18   Ht 5\' 10"  (1.778 m)   Wt 110 kg   SpO2 98%   BMI 34.80 kg/m  Physical Exam Vitals and nursing note reviewed.  Constitutional:      General: He is not in acute distress.    Appearance: He is well-developed.  HENT:     Head: Normocephalic and atraumatic.  Eyes:     Conjunctiva/sclera: Conjunctivae normal.  Cardiovascular:  Rate and Rhythm: Normal rate and regular rhythm.     Heart sounds: No murmur heard. Pulmonary:     Effort: Pulmonary effort is normal. No respiratory distress.     Breath sounds: Normal breath sounds.  Abdominal:     Palpations: Abdomen is soft.     Tenderness: There is no abdominal tenderness.  Musculoskeletal:        General: No swelling.     Cervical back: Neck supple.  Skin:    General: Skin is warm and dry.     Capillary Refill: Capillary refill takes less than 2 seconds.  Neurological:     Mental Status: He is alert.     Comments: 5/5 strength in bilateral lower extremities with intact symmetric sensation.  No saddle anesthesia  Psychiatric:        Mood and Affect: Mood normal.     ED Results / Procedures / Treatments   Labs (all labs ordered are listed, but only abnormal results are  displayed) Labs Reviewed  CBC WITH DIFFERENTIAL/PLATELET - Abnormal; Notable for the following components:      Result Value   WBC 24.7 (*)    RBC 2.53 (*)    Hemoglobin 7.4 (*)    HCT 21.8 (*)    Neutro Abs 18.7 (*)    Monocytes Absolute 2.9 (*)    Abs Immature Granulocytes 0.15 (*)    All other components within normal limits  COMPREHENSIVE METABOLIC PANEL WITH GFR - Abnormal; Notable for the following components:   Glucose, Bld 219 (*)    BUN 29 (*)    Creatinine, Ser 1.68 (*)    Calcium  8.8 (*)    Albumin 3.0 (*)    Total Bilirubin 1.9 (*)    GFR, Estimated 50 (*)    All other components within normal limits  RETICULOCYTES - Abnormal; Notable for the following components:   RBC. 2.53 (*)    All other components within normal limits  RESP PANEL BY RT-PCR (RSV, FLU A&B, COVID)  [id number]  CULTURE, BLOOD (ROUTINE X 2)  CULTURE, BLOOD (ROUTINE X 2)  URINALYSIS, ROUTINE W REFLEX MICROSCOPIC  CBC WITH DIFFERENTIAL/PLATELET  COMPREHENSIVE METABOLIC PANEL WITH GFR  MAGNESIUM  MAGNESIUM  HEMOGLOBIN AND HEMATOCRIT, BLOOD  VITAMIN B12  FOLATE  IRON AND TIBC  FERRITIN  PROTIME-INR  APTT  CBC  I-STAT CG4 LACTIC ACID, ED  POC OCCULT BLOOD, ED  TYPE AND SCREEN    EKG None  Radiology MR LUMBAR SPINE WO CONTRAST Result Date: 11/25/2023 CLINICAL DATA:  Initial evaluation for acute myelopathy, left leg pain. EXAM: MRI LUMBAR SPINE WITHOUT CONTRAST TECHNIQUE: Multiplanar, multisequence MR imaging of the lumbar spine was performed. No intravenous contrast was administered. COMPARISON:  Prior radiograph from 11/04/2023. FINDINGS: Segmentation:  Examination degraded by motion artifact. Standard segmentation. Lowest well-formed disc space labeled the L5-S1 level. Alignment: Vertebral bodies normally aligned with preservation of the normal lumbar lordosis. No significant listhesis. Vertebrae: Vertebral body height maintained without acute or chronic fracture. Bone marrow signal intensity  within normal limits. No worrisome osseous lesions. No abnormal marrow edema. Conus medullaris and cauda equina: Conus extends to the T12-L1 level. Conus and cauda equina appear normal. Paraspinal and other soft tissues: Paraspinous soft tissues within normal limits. Prominent distension of the partially visualized urinary bladder noted. Disc levels: L1-2:  Unremarkable. L2-3: Normal interspace. Mild bilateral facet hypertrophy. No significant spinal stenosis. Foramina remain patent. L3-4: Normal interspace. Mild bilateral facet hypertrophy. No spinal stenosis. Foramina remain patent. L4-5: Disc  desiccation. Superimposed left subarticular to foraminal disc protrusion with annular fissure, potentially affecting either the left L4 or descending L5 nerve roots (series 8, image 30). Superimposed mild to moderate facet hypertrophy. Resultant moderate canal with severe left and moderate right lateral recess stenosis. Mild to moderate left with mild right L4 foraminal narrowing. L5-S1: Disc desiccation. Left subarticular disc protrusion with annular fissure impinges upon the descending left S1 nerve root in the left lateral recess (series 8, image 35). Superimposed mild facet hypertrophy. Resultant moderate to severe left lateral recess stenosis. Central canal remains patent. Mild left L5 foraminal stenosis. Right neural foramen remains patent. IMPRESSION: 1. Left subarticular disc protrusion with annular fissure at L5-S1, impinging upon the descending left S1 nerve root in the left lateral recess. 2. Left subarticular to foraminal disc protrusion at L4-5, potentially affecting either the left L4 or descending L5 nerve roots. Resultant moderate canal with severe left lateral recess stenosis at this level. 3. Prominent distension of the partially visualized urinary bladder. Clinical correlation for possible urinary retention recommended. Electronically Signed   By: Virgia Griffins M.D.   On: 11/25/2023 20:19     Procedures Procedures    Medications Ordered in ED Medications  lactated ringers  bolus 1,000 mL (has no administration in time range)  acetaminophen  (TYLENOL ) tablet 650 mg (has no administration in time range)    Or  acetaminophen  (TYLENOL ) suppository 650 mg (has no administration in time range)  melatonin tablet 3 mg (has no administration in time range)  ondansetron  (ZOFRAN ) injection 4 mg (has no administration in time range)  naloxone  (NARCAN ) injection 0.4 mg (has no administration in time range)  lidocaine  (LIDODERM ) 5 % 1 patch (has no administration in time range)  fentaNYL  (SUBLIMAZE ) injection 25 mcg (has no administration in time range)  fentaNYL  (SUBLIMAZE ) injection 50 mcg (50 mcg Intravenous Given 11/25/23 1705)  acetaminophen  (TYLENOL ) tablet 1,000 mg (1,000 mg Oral Given 11/25/23 1949)    ED Course/ Medical Decision Making/ A&P                                 Medical Decision Making Amount and/or Complexity of Data Reviewed Labs: ordered. Radiology: ordered.  Risk OTC drugs. Prescription drug management. Decision regarding hospitalization.   Patient is alert and hemodynamically stable in the ED in no acute distress.  Physical exam with no acute neurologic findings.  Will obtain MRI to rule out compressive spinal cord pathology given incontinence noted.  Otherwise, suspect sciatica based on patient's symptoms though I do not have an acute explanation for his bowel and bladder incontinence.  Labs resulted with hemoglobin of 7.4, an acute drop from 14 3 weeks ago.  WBC also elevated at 24, with component likely due to steroid use.  CMP with BUN 29 and creatinine 1.68.  Will awaiting workup in the ED, patient developed fever with temperature 102.8.  MRI resulted with L4 vs L5, S1 nerve root impingement from disc protrusion.  No acute cord compression noted.  However, patient does require admission due to acute hemoglobin drop.  Hemoccult test in the ED was  negative, and patient denies any episodes of melena, hematochezia, or hematemesis.  Patient does not take any blood thinners.  Handoff was given to admitting team, and patient remained stable in the ED.  Patient was seen in conjunction with Dr. Moses Arenas, who agreed with this care plan.        Final Clinical Impression(s) /  ED Diagnoses Final diagnoses:  Lumbar radiculopathy    Rx / DC Orders ED Discharge Orders     None         Lorain Robson, MD 11/25/23 2231    Guadalupe Lee, MD 12/11/23 1128

## 2023-11-25 NOTE — ED Notes (Signed)
 Patient was given a cup of applesauce.

## 2023-11-25 NOTE — Discharge Instructions (Signed)
 Please go to [facility] for MRI of your lumbar spine as planned.

## 2023-11-25 NOTE — ED Triage Notes (Signed)
 C/o left leg pain. Starts from lower back and rads down leg. Seen for same at [facility]. States able to walk.   Patient also states he has problems controlling bowels starting yesterday.

## 2023-11-25 NOTE — H&P (Signed)
 History and Physical      Michael Willis EAV:[id number] DOB: 07-Jul-1976 DOA: 11/25/2023; DOS: 11/25/2023  PCP: Wanda Plump, MD *** Patient coming from: home ***  I have personally briefly reviewed patient's old medical records in [facility]  Chief Complaint: ***  HPI: Michael Willis is a 48 y.o. male with medical history significant for *** who is admitted to [facility] on 11/25/2023 with *** after presenting from home*** to [facility] ED complaining of ***.    ***       ***   ED Course:  Vital signs in the ED were notable for the following: ***  Labs were notable for the following: ***  Per my interpretation, EKG in ED demonstrated the following:  ***  Imaging in the ED, per corresponding formal radiology read, was notable for the following:  ***  While in the ED, the following were administered: ***  Subsequently, the patient was admitted  ***  ***red    Review of Systems: As per HPI otherwise 10 point review of systems negative.   Past Medical History:  Diagnosis Date   Diabetes (HCC) 01/05/2013   Elevated LFTs 01/05/2013   HTN (hypertension) 12/12/2012   Hyperlipidemia 12/04/2013   Obesity    Tinea pedis, onychomycosis 01/05/2013    Past Surgical History:  Procedure Laterality Date   RIGHT/LEFT HEART CATH AND CORONARY ANGIOGRAPHY N/A 01/30/2023   Procedure: RIGHT/LEFT HEART CATH AND CORONARY ANGIOGRAPHY;  Surgeon: Dolores Patty, MD;  Location: [facility] INVASIVE CV LAB;  Service: Cardiovascular;  Laterality: N/A;    Social History:  reports that he quit smoking about 23 years ago. His smoking use included cigars and pipe. He has never used smokeless tobacco. He reports current alcohol use. He reports that he does not use drugs.   No Known Allergies  Family History  Problem Relation Age of Onset   CAD Mother 32       MI?   Diabetes Neg Hx    Stroke Neg Hx    Colon cancer Neg Hx    Prostate cancer Neg Hx     Family history reviewed and not  pertinent ***   Prior to Admission medications   Medication Sig Start Date End Date Taking? Authorizing Provider  atorvastatin (LIPITOR) 80 MG tablet Take 1 tablet by mouth once daily 05/30/23   Bensimhon, Bevelyn Buckles, MD  blood glucose meter kit and supplies Dispense based on patient and insurance preference. Check blood sugar twice daily. DX: E11.9 11/13/23   Wanda Plump, MD  carvedilol (COREG) 12.5 MG tablet Take 1 tablet (12.5 mg total) by mouth 2 (two) times daily with a meal. 05/23/23   Bensimhon, Bevelyn Buckles, MD  diclofenac Sodium (VOLTAREN) 1 % GEL Apply 4 g topically 4 (four) times daily. 11/01/23   Melene Plan, DO  empagliflozin (JARDIANCE) 10 MG TABS tablet Take 1 tablet (10 mg total) by mouth daily before breakfast. 11/13/23   Wanda Plump, MD  ezetimibe (ZETIA) 10 MG tablet Take 1 tablet (10 mg total) by mouth daily. 05/23/23   Bensimhon, Bevelyn Buckles, MD  furosemide (LASIX) 20 MG tablet Take 1 tablet (20 mg total) by mouth daily as needed (weight gain 3 lbs in 1 day or 5 lbs in 1 week). 05/23/23   Bensimhon, Bevelyn Buckles, MD  glipiZIDE (GLUCOTROL XL) 10 MG 24 hr tablet Take 1 tablet (10 mg total) by mouth daily with breakfast. 11/13/23   Wanda Plump, MD  glucose  blood test strip Check blood sugar twice daily 11/13/23   Wanda Plump, MD  methocarbamol (ROBAXIN) 500 MG tablet Take 1 tablet (500 mg total) by mouth 2 (two) times daily. Patient not taking: Reported on 11/13/2023 11/01/23   Melene Plan, DO  spironolactone (ALDACTONE) 25 MG tablet Take 1 tablet (25 mg total) by mouth daily. 05/23/23   Bensimhon, Bevelyn Buckles, MD     Objective    Physical Exam: Vitals:   11/25/23 1345 11/25/23 1753 11/25/23 1958 11/25/23 2024  BP: (!) 141/77 (!) 97/57 134/85   Pulse: (!) 103 100 100   Resp: 16 18 18    Temp: 98.4 F (36.9 C) (!) 102.8 F (39.3 C)  (!) 102.3 F (39.1 C)  TempSrc: Oral Oral  Rectal  SpO2: 99% 97% 98%   Weight:      Height:        General: appears to be stated age; alert, oriented Skin:  warm, dry, no rash Head:  AT/[place] Mouth:  Oral mucosa membranes appear moist, normal dentition Neck: supple; trachea midline Heart:  RRR; did not appreciate any M/R/G Lungs: CTAB, did not appreciate any wheezes, rales, or rhonchi Abdomen: + BS; soft, ND, NT Vascular: 2+ pedal pulses b/l; 2+ radial pulses b/l Extremities: no peripheral edema, no muscle wasting Neuro: strength and sensation intact in upper and lower extremities b/l ***   *** Neuro: 5/5 strength of the proximal and distal flexors and extensors of the upper and lower extremities bilaterally; sensation intact in upper and lower extremities b/l; cranial nerves II through XII grossly intact; no pronator drift; no evidence suggestive of slurred speech, dysarthria, or facial droop; Normal muscle tone. No tremors.  *** Neuro: In the setting of the patient's current mental status and associated inability to follow instructions, unable to perform full neurologic exam at this time.  As such, assessment of strength, sensation, and cranial nerves is limited at this time. Patient noted to spontaneously move all 4 extremities. No tremors.  ***    Labs on Admission: I have personally reviewed following labs and imaging studies  CBC: Recent Labs  Lab 11/25/23 1710  WBC 24.7*  NEUTROABS 18.7*  HGB 7.4*  HCT 21.8*  MCV 86.2  PLT 329   Basic Metabolic Panel: Recent Labs  Lab 11/25/23 1710  NA 136  K 3.8  CL 101  CO2 23  GLUCOSE 219*  BUN 29*  CREATININE 1.68*  CALCIUM 8.8*   GFR: Estimated Creatinine Clearance: 67.5 mL/min (A) (by C-G formula based on SCr of 1.68 mg/dL (H)). Liver Function Tests: Recent Labs  Lab 11/25/23 1710  AST 27  ALT 28  ALKPHOS 58  BILITOT 1.9*  PROT 7.2  ALBUMIN 3.0*   No results for input(s): "LIPASE", "AMYLASE" in the last 168 hours. No results for input(s): "AMMONIA" in the last 168 hours. Coagulation Profile: No results for input(s): "INR", "PROTIME" in the last 168 hours. Cardiac  Enzymes: No results for input(s): "CKTOTAL", "CKMB", "CKMBINDEX", "TROPONINI" in the last 168 hours. BNP (last 3 results) No results for input(s): "PROBNP" in the last 8760 hours. HbA1C: No results for input(s): "HGBA1C" in the last 72 hours. CBG: No results for input(s): "GLUCAP" in the last 168 hours. Lipid Profile: No results for input(s): "CHOL", "HDL", "LDLCALC", "TRIG", "CHOLHDL", "LDLDIRECT" in the last 72 hours. Thyroid Function Tests: No results for input(s): "TSH", "T4TOTAL", "FREET4", "T3FREE", "THYROIDAB" in the last 72 hours. Anemia Panel: No results for input(s): "VITAMINB12", "FOLATE", "FERRITIN", "TIBC", "IRON", "RETICCTPCT" in  the last 72 hours. Urine analysis:    Component Value Date/Time   COLORURINE YELLOW 11/03/2023 1932   APPEARANCEUR HAZY (A) 11/03/2023 1932   LABSPEC 1.015 11/03/2023 1932   PHURINE 5.0 11/03/2023 1932   GLUCOSEU >=500 (A) 11/03/2023 1932   GLUCOSEU >=1000 (A) 04/03/2023 0901   HGBUR NEGATIVE 11/03/2023 1932   BILIRUBINUR NEGATIVE 11/03/2023 1932   KETONESUR NEGATIVE 11/03/2023 1932   PROTEINUR NEGATIVE 11/03/2023 1932   UROBILINOGEN 0.2 04/03/2023 0901   NITRITE NEGATIVE 11/03/2023 1932   LEUKOCYTESUR LARGE (A) 11/03/2023 1932    Radiological Exams on Admission: MR LUMBAR SPINE WO CONTRAST Result Date: 11/25/2023 CLINICAL DATA:  Initial evaluation for acute myelopathy, left leg pain. EXAM: MRI LUMBAR SPINE WITHOUT CONTRAST TECHNIQUE: Multiplanar, multisequence MR imaging of the lumbar spine was performed. No intravenous contrast was administered. COMPARISON:  Prior radiograph from 11/04/2023. FINDINGS: Segmentation:  Examination degraded by motion artifact. Standard segmentation. Lowest well-formed disc space labeled the L5-S1 level. Alignment: Vertebral bodies normally aligned with preservation of the normal lumbar lordosis. No significant listhesis. Vertebrae: Vertebral body height maintained without acute or chronic fracture. Bone marrow  signal intensity within normal limits. No worrisome osseous lesions. No abnormal marrow edema. Conus medullaris and cauda equina: Conus extends to the T12-L1 level. Conus and cauda equina appear normal. Paraspinal and other soft tissues: Paraspinous soft tissues within normal limits. Prominent distension of the partially visualized urinary bladder noted. Disc levels: L1-2:  Unremarkable. L2-3: Normal interspace. Mild bilateral facet hypertrophy. No significant spinal stenosis. Foramina remain patent. L3-4: Normal interspace. Mild bilateral facet hypertrophy. No spinal stenosis. Foramina remain patent. L4-5: Disc desiccation. Superimposed left subarticular to foraminal disc protrusion with annular fissure, potentially affecting either the left L4 or descending L5 nerve roots (series 8, image 30). Superimposed mild to moderate facet hypertrophy. Resultant moderate canal with severe left and moderate right lateral recess stenosis. Mild to moderate left with mild right L4 foraminal narrowing. L5-S1: Disc desiccation. Left subarticular disc protrusion with annular fissure impinges upon the descending left S1 nerve root in the left lateral recess (series 8, image 35). Superimposed mild facet hypertrophy. Resultant moderate to severe left lateral recess stenosis. Central canal remains patent. Mild left L5 foraminal stenosis. Right neural foramen remains patent. IMPRESSION: 1. Left subarticular disc protrusion with annular fissure at L5-S1, impinging upon the descending left S1 nerve root in the left lateral recess. 2. Left subarticular to foraminal disc protrusion at L4-5, potentially affecting either the left L4 or descending L5 nerve roots. Resultant moderate canal with severe left lateral recess stenosis at this level. 3. Prominent distension of the partially visualized urinary bladder. Clinical correlation for possible urinary retention recommended. Electronically Signed   By: Rise Mu M.D.   On: 11/25/2023  20:19      Assessment/Plan   Principal Problem:   Acute anemia   ***            ***                  ***                   ***                  ***                  ***                  ***                   ***                  ***                  ***                  ***                  ***                 ***                ***  DVT prophylaxis: SCD's ***  Code Status: Full code*** Family Communication: none*** Disposition Plan: Per Rounding Team Consults called: none***;  Admission status: ***     I SPENT GREATER THAN 75 *** MINUTES IN CLINICAL CARE TIME/MEDICAL DECISION-MAKING IN COMPLETING THIS ADMISSION.      Chaney Born Seylah Wernert DO [facility]  From 7PM - 7AM   11/25/2023, 10:16 PM   ***

## 2023-11-26 ENCOUNTER — Observation Stay (HOSPITAL_COMMUNITY)

## 2023-11-26 ENCOUNTER — Encounter (HOSPITAL_COMMUNITY): Payer: Self-pay | Admitting: Internal Medicine

## 2023-11-26 DIAGNOSIS — I5032 Chronic diastolic (congestive) heart failure: Secondary | ICD-10-CM | POA: Diagnosis present

## 2023-11-26 DIAGNOSIS — R509 Fever, unspecified: Secondary | ICD-10-CM | POA: Diagnosis present

## 2023-11-26 DIAGNOSIS — M545 Low back pain, unspecified: Secondary | ICD-10-CM | POA: Diagnosis present

## 2023-11-26 DIAGNOSIS — N1831 Chronic kidney disease, stage 3a: Secondary | ICD-10-CM | POA: Diagnosis present

## 2023-11-26 DIAGNOSIS — R6510 Systemic inflammatory response syndrome (SIRS) of non-infectious origin without acute organ dysfunction: Secondary | ICD-10-CM | POA: Diagnosis present

## 2023-11-26 DIAGNOSIS — D649 Anemia, unspecified: Secondary | ICD-10-CM | POA: Diagnosis not present

## 2023-11-26 DIAGNOSIS — D72829 Elevated white blood cell count, unspecified: Secondary | ICD-10-CM | POA: Diagnosis present

## 2023-11-26 LAB — CBC WITH DIFFERENTIAL/PLATELET
Abs Immature Granulocytes: 0.16 10*3/uL — ABNORMAL HIGH (ref 0.00–0.07)
Basophils Absolute: 0.1 10*3/uL (ref 0.0–0.1)
Basophils Relative: 0 %
Eosinophils Absolute: 0 10*3/uL (ref 0.0–0.5)
Eosinophils Relative: 0 %
HCT: 23.7 % — ABNORMAL LOW (ref 39.0–52.0)
Hemoglobin: 8 g/dL — ABNORMAL LOW (ref 13.0–17.0)
Immature Granulocytes: 1 %
Lymphocytes Relative: 13 %
Lymphs Abs: 2.8 10*3/uL (ref 0.7–4.0)
MCH: 29.3 pg (ref 26.0–34.0)
MCHC: 33.8 g/dL (ref 30.0–36.0)
MCV: 86.8 fL (ref 80.0–100.0)
Monocytes Absolute: 2.6 10*3/uL — ABNORMAL HIGH (ref 0.1–1.0)
Monocytes Relative: 12 %
Neutro Abs: 15.6 10*3/uL — ABNORMAL HIGH (ref 1.7–7.7)
Neutrophils Relative %: 74 %
Platelets: 326 10*3/uL (ref 150–400)
RBC: 2.73 MIL/uL — ABNORMAL LOW (ref 4.22–5.81)
RDW: 12 % (ref 11.5–15.5)
WBC: 21.3 10*3/uL — ABNORMAL HIGH (ref 4.0–10.5)
nRBC: 0 % (ref 0.0–0.2)

## 2023-11-26 LAB — TYPE AND SCREEN
ABO/RH(D): AB POS
Antibody Screen: NEGATIVE

## 2023-11-26 LAB — COMPREHENSIVE METABOLIC PANEL WITH GFR
ALT: 32 U/L (ref 0–44)
AST: 28 U/L (ref 15–41)
Albumin: 2.9 g/dL — ABNORMAL LOW (ref 3.5–5.0)
Alkaline Phosphatase: 57 U/L (ref 38–126)
Anion gap: 11 (ref 5–15)
BUN: 33 mg/dL — ABNORMAL HIGH (ref 6–20)
CO2: 23 mmol/L (ref 22–32)
Calcium: 8.9 mg/dL (ref 8.9–10.3)
Chloride: 100 mmol/L (ref 98–111)
Creatinine, Ser: 1.91 mg/dL — ABNORMAL HIGH (ref 0.61–1.24)
GFR, Estimated: 43 mL/min — ABNORMAL LOW (ref >60)
Glucose, Bld: 212 mg/dL — ABNORMAL HIGH (ref 70–99)
Potassium: 3.8 mmol/L (ref 3.5–5.1)
Sodium: 134 mmol/L — ABNORMAL LOW (ref 135–145)
Total Bilirubin: 1.7 mg/dL — ABNORMAL HIGH (ref 0.0–1.2)
Total Protein: 7.2 g/dL (ref 6.5–8.1)

## 2023-11-26 LAB — IRON AND TIBC
Iron: 20 ug/dL — ABNORMAL LOW (ref 45–182)
Saturation Ratios: 10 % — ABNORMAL LOW (ref 17.9–39.5)
TIBC: 197 ug/dL — ABNORMAL LOW (ref 250–450)
UIBC: 177 ug/dL

## 2023-11-26 LAB — ABO/RH: ABO/RH(D): AB POS

## 2023-11-26 LAB — GLUCOSE, CAPILLARY
Glucose-Capillary: 216 mg/dL — ABNORMAL HIGH (ref 70–99)
Glucose-Capillary: 251 mg/dL — ABNORMAL HIGH (ref 70–99)
Glucose-Capillary: 233 mg/dL — ABNORMAL HIGH (ref 70–99)
Glucose-Capillary: 195 mg/dL — ABNORMAL HIGH (ref 70–99)

## 2023-11-26 LAB — PROTIME-INR
INR: 1.1 (ref 0.8–1.2)
Prothrombin Time: 14.9 s (ref 11.4–15.2)

## 2023-11-26 LAB — TECHNOLOGIST SMEAR REVIEW: Plt Morphology: ADEQUATE

## 2023-11-26 LAB — MAGNESIUM: Magnesium: 2.4 mg/dL (ref 1.7–2.4)

## 2023-11-26 LAB — PROCALCITONIN: Procalcitonin: 1.88 ng/mL

## 2023-11-26 LAB — HEMOGLOBIN A1C
Hgb A1c MFr Bld: 7.4 % — ABNORMAL HIGH (ref 4.8–5.6)
Mean Plasma Glucose: 165.68 mg/dL

## 2023-11-26 LAB — APTT: aPTT: 33 s (ref 24–36)

## 2023-11-26 LAB — FERRITIN: Ferritin: 1415 ng/mL — ABNORMAL HIGH (ref 24–336)

## 2023-11-26 LAB — LACTATE DEHYDROGENASE: LDH: 161 U/L (ref 98–192)

## 2023-11-26 LAB — HEMOGLOBIN AND HEMATOCRIT, BLOOD
HCT: 30.4 % — ABNORMAL LOW (ref 39.0–52.0)
Hemoglobin: 10.2 g/dL — ABNORMAL LOW (ref 13.0–17.0)

## 2023-11-26 LAB — VITAMIN B12: Vitamin B-12: 541 pg/mL (ref 180–914)

## 2023-11-26 MED ORDER — INSULIN ASPART 100 UNIT/ML IJ SOLN
0.0000 [IU] | Freq: Three times a day (TID) | INTRAMUSCULAR | Status: DC
  Administered 2023-11-26: 5 [IU] via SUBCUTANEOUS
  Administered 2023-11-26: 3 [IU] via SUBCUTANEOUS
  Administered 2023-11-26: 8 [IU] via SUBCUTANEOUS
  Administered 2023-11-27: 5 [IU] via SUBCUTANEOUS
  Administered 2023-11-27: 3 [IU] via SUBCUTANEOUS

## 2023-11-26 MED ORDER — EZETIMIBE 10 MG PO TABS
10.0000 mg | ORAL_TABLET | Freq: Every day | ORAL | Status: DC
  Administered 2023-11-26 – 2023-11-27 (×2): 10 mg via ORAL
  Filled 2023-11-26 (×2): qty 1

## 2023-11-26 MED ORDER — ATORVASTATIN CALCIUM 80 MG PO TABS
80.0000 mg | ORAL_TABLET | Freq: Every day | ORAL | Status: DC
  Administered 2023-11-26 – 2023-11-27 (×2): 80 mg via ORAL
  Filled 2023-11-26 (×2): qty 1

## 2023-11-26 NOTE — Plan of Care (Signed)

## 2023-11-26 NOTE — Progress Notes (Signed)
 OT Cancellation Note and Discharge  Patient Details Name: TAEVIN MCFERRAN MRN: [id number] DOB: 1976-08-18   Cancelled Treatment:    Reason Eval/Treat Not Completed: OT screened, no needs identified, will sign off. In speaking with pt and seeing him readily get up and move no OT needs identified--only PT to see patient.  Lindon Romp OT Acute Rehabilitation Services Office [phone]    Evette Georges 11/26/2023, 10:09 AM

## 2023-11-26 NOTE — Inpatient Diabetes Management (Signed)
 Inpatient Diabetes Program Recommendations  AACE/ADA: New Consensus Statement on Inpatient Glycemic Control   Target Ranges:  Prepandial:   less than 140 mg/dL      Peak postprandial:   less than 180 mg/dL (1-2 hours)      Critically ill patients:  140 - 180 mg/dL    Latest Reference Range & Units 11/26/23 07:24 11/26/23 12:24  Glucose-Capillary 70 - 99 mg/dL 409 (H) 811 (H)   Review of Glycemic Control  Diabetes history: DM2 Outpatient Diabetes medications: Jardanice 10 mg QAM, Glipizide XL 10 mg QAM Current orders for Inpatient glycemic control: Novolog 0-15 units TID  Inpatient Diabetes Program Recommendations:    Insulin: Please consider adding Novolog 0-5 units at bedtime.  Diet: May want to consider discontinuing Regular diet and ordering Carb Modified diet.  Thanks, Orlando Penner, RN, MSN, CDCES Diabetes Coordinator Inpatient Diabetes Program [phone] (Team Pager from 8am to 5pm)

## 2023-11-26 NOTE — Progress Notes (Signed)
 Progress Note   Patient: Michael Willis UJW:[id number] DOB: [birth year] DOA: 11/25/2023     0 DOS: the patient was seen and examined on 11/26/2023   Brief hospital course: 48 year old man with PMH of HFpEF, T2DM, CKD stage III, HTN who presented with complaints of lower back pain and was found to have acute anemia.  Patient admitted for further workup.  Assessment and Plan:  Fevers Patient presented with fevers. Procalcitonin is elevated RVP negative. He has no localizing symptoms. Chest x-ray unremarkable.  No urinary symptoms. No current abdominal symptoms although he does report a history of profuse diarrhea that lasted for 1 day in which he relates to having eaten a bad hot dog. - Follow-up blood cultures. Of note, lab report states "Blood Culture results may not be optimal due to an inadequate volume of blood received in culture bottles." - TTE to rule out endocarditis  -Holding off antibiotics for now given no clear focus of infection.  Acute anemia Patient denies melena or other bleeding. Acute drop in hemoglobin from 14.8 on 11/03/2023 to 7.4 on admission on 11/25/2023. Hemoglobin appears to have stabilized. Acuity of drop in hemoglobin suggestive of either bleeding or hemolysis. No proteinuria, renal impairment or Xray changes to suggest multiple myeloma. -Follow-up LDH, haptoglobin, trend bilirubin. - Will consult hematology if preliminary workup is negative.   Subacute low back pain Patient has known radiculopathy due to spinal stenosis. Follows up as outpatient. MRI lumbar spine done on 4/7 showed disc protrusions, stenosis, nerve root compression.  HFpEF TTE of 05/23/2023 revealed EF 50 to 55%, diastolic dysfunction. -Will monitor volume status.  T2DM A1c is 7.4. Patient is on glipizide, Jardiance at home. - Continue sliding scale insulin. - Continue diabetic diet.   CKD stage III Baseline creatinine appears to be 1.3-1.7. Patient remains at his baseline. - Avoid  nephrotoxins.  HLD -Continue home Zetia.  HTN On Coreg, Lasix, spironolactone at home. - Holding home medications due to relatively low pressures on presentation.  Class I obesity -For outpatient management.      Subjective: Patient states he is feeling well.  He denies headache, sore throat, runny nose, cough, chest pain, shortness of breath.  He also denies abdominal pain, nausea, vomiting.  He denies dysuria, wounds, swellings. He states he had diarrhea 2 days ago that lasted all day and was very profuse.  He attributes it to his having eaten a bad hot dog.  Diarrhea has resolved.  Physical Exam: Vitals:   11/26/23 0145 11/26/23 0744 11/26/23 1250 11/26/23 1447  BP: 100/74 120/72 112/71 106/68  Pulse: 87 95  (!) 102  Resp: 20 16 18 18   Temp: 98.5 F (36.9 C) 98.4 F (36.9 C) 99.6 F (37.6 C) 99.6 F (37.6 C)  TempSrc: Oral Oral Oral Oral  SpO2: 97% 97%  95%  Weight: 105.8 kg     Height: 5\' 10"  (1.778 m)        General: Alert, oriented X3  Eyes: Pupils equal, reactive  Oral cavity: moist mucous membranes  Head: Atraumatic, normocephalic  Neck: supple  Chest: clear to auscultation. No crackles, no wheezes  CVS: S1,S2 RRR. No murmurs  Abd: No distention, soft, non-tender. No masses palpable  Extr: No edema   MSK: No joint deformities or swelling  Neurological: Grossly intact.    Data Reviewed:     Latest Ref Rng & Units 11/26/2023    1:23 PM 11/26/2023    4:46 AM 11/25/2023    5:10 PM  CBC  WBC 4.0 - 10.5 K/uL  21.3  24.7   Hemoglobin 13.0 - 17.0 g/dL 43.3  8.0  7.4   Hematocrit 39.0 - 52.0 % 30.4  23.7  21.8   Platelets 150 - 400 K/uL  326  329       Latest Ref Rng & Units 11/26/2023    4:46 AM 11/25/2023    5:10 PM 11/03/2023    7:32 PM  BMP  Glucose 70 - 99 mg/dL 295  188  416   BUN 6 - 20 mg/dL 33  29  21   Creatinine 0.61 - 1.24 mg/dL 6.06  3.01  6.01   Sodium 135 - 145 mmol/L 134  136  138   Potassium 3.5 - 5.1 mmol/L 3.8  3.8  4.1   Chloride 98 -  111 mmol/L 100  101  103   CO2 22 - 32 mmol/L 23  23  26    Calcium 8.9 - 10.3 mg/dL 8.9  8.8  9.8      Family Communication: n/a  Disposition: Status is: Observation The patient will require care spanning > 2 midnights and should be moved to inpatient because: He has ongoing fevers that require further diagnostic workup and treatment.   Planned Discharge Destination: Home    Time spent: 50 minutes  Author: Marcine Matar, MD 11/26/2023 3:27 PM  For on call review [email or web address].

## 2023-11-26 NOTE — Evaluation (Signed)
 Physical Therapy Brief Evaluation and Discharge Note Patient Details Name: Michael Willis MRN: [id number] DOB: 10/07/1975 Today's Date: 11/26/2023   History of Present Illness  The pt is a 48 yo male presenting 4/7 with low back pain that radiates down LLE and bowel incontinence. Work up also revealed Hgb of 7.4. PMH includes: CHF, DM II, HTN, CKD III, and obesity (BMI 33).   Clinical Impression  Pt in bed upon arrival of PT, agreeable to evaluation at this time. Prior to admission the pt was completely independent with all mobility, reports pain in low back and radiating to LLE started 6-7 weeks ago with no change in activity or movement that he remembers. No falls or injury. Pt demos good strength in LLE other than slightly decreased L hip flexion and does show compensatory movements to complete knee extension and hip flexion (trunk sidebending to R). Pt denies numbness in LLE and demos good proprioception and coordination. Deficits noted in L hip ROM and HS length, educated on stretches (and given HEP handout) with these. Encouraged pt to attend OPPT for further assessment of back pain and progression of stretches/exercises. No acute PT needs identified at this time, has all needed assist at home and is safe to return with family assist when medically stable.    PT Assessment Patient does not need any further PT services  Assistance Needed at Discharge  PRN    Equipment Recommendations None recommended by PT  Recommendations for Other Services       Precautions/Restrictions Precautions Precautions: None Recall of Precautions/Restrictions: Intact Restrictions Weight Bearing Restrictions Per Provider Order: No        Mobility  Bed Mobility Rolling: Independent Supine/Sidelying to sit: Independent Sit to supine/sidelying: Independent General bed mobility comments: independent  Transfers Overall transfer level: Independent Equipment used: None               General  transfer comment: supervision initially, no assistance needed    Ambulation/Gait Ambulation/Gait assistance: Independent Gait Distance (Feet): 300 Feet Assistive device: None Gait Pattern/deviations: Step-through pattern, Decreased stance time - left, Antalgic Gait Speed: Pace WFL General Gait Details: pt with slightly antalgic gait due to pain in LLE, no overt buckling or LOB, stable wiht challenge  Home Activity Instructions    Stairs Stairs: Yes Stairs assistance: Supervision Stair Management: No rails, Alternating pattern, Forwards Number of Stairs: 12    Modified Rankin (Stroke Patients Only)        Balance Overall balance assessment: Independent                        Pertinent Vitals/Pain PT - Brief Vital Signs All Vital Signs Stable: Yes Pain Assessment Pain Assessment: Faces Faces Pain Scale: Hurts little more Pain Location: low back, back of LLE Pain Descriptors / Indicators: Discomfort, Sharp Pain Intervention(s): Limited activity within patient's tolerance, Monitored during session, Repositioned     Home Living Family/patient expects to be discharged to:: Private residence Living Arrangements: Spouse/significant other Available Help at Discharge: Family;Available PRN/intermittently Home Environment: Level entry;Stairs in home;Rail - right  Stairs-Number of Steps: 12 Home Equipment: None   Additional Comments: lives with his wife, works mowing yards Tourist information centre manager)    Prior Function Level of Independence: Independent      UE/LE Assessment   UE ROM/Strength/Tone/Coordination: WFL    LE ROM/Strength/Tone/Coordination: Impaired LE ROM/Strength/Tone/Coordination Deficits: RLE WFL for ROM and strength, LLE with slightly decreased ROM at hip and in HS, grossly 4/5 for  L hip flexion    Communication   Communication Communication: No apparent difficulties     Cognition Overall Cognitive Status: Appears within functional limits for tasks  assessed/performed       General Comments      Exercises Other Exercises Other Exercises: HEP given including: childs pose with side bending, figure 4 stretch, seated HS stretch, pelvic tilts (supine and seated)         PT Visit Diagnosis Pain    No Skilled PT All education completed;Patient will have necessary level of assist by caregiver at discharge;Patient is independent with all acitivity/mobility    AMPAC 6 Clicks Help needed turning from your back to your side while in a flat bed without using bedrails?: None Help needed moving from lying on your back to sitting on the side of a flat bed without using bedrails?: None Help needed moving to and from a bed to a chair (including a wheelchair)?: None Help needed standing up from a chair using your arms (e.g., wheelchair or bedside chair)?: None Help needed to walk in hospital room?: A Little Help needed climbing 3-5 steps with a railing? : A Little 6 Click Score: 22      End of Session Equipment Utilized During Treatment: Gait belt Activity Tolerance: Patient tolerated treatment well Patient left: in chair;with call bell/phone within reach;with family/visitor present Nurse Communication: Mobility status PT Visit Diagnosis: Pain Pain - Right/Left: Left Pain - part of body: Leg (and back)     Time: 6045-4098 PT Time Calculation (min) (ACUTE ONLY): 21 min  Charges:   PT Evaluation $PT Eval Low Complexity: 1 Low      Vickki Muff, PT, DPT   Acute Rehabilitation Department Office [phone] Secure Chat Communication Preferred  Ronnie Derby  11/26/2023, 11:52 AM

## 2023-11-27 ENCOUNTER — Observation Stay (HOSPITAL_COMMUNITY)

## 2023-11-27 DIAGNOSIS — D649 Anemia, unspecified: Secondary | ICD-10-CM | POA: Diagnosis not present

## 2023-11-27 LAB — COMPREHENSIVE METABOLIC PANEL WITH GFR
ALT: 41 U/L (ref 0–44)
AST: 41 U/L (ref 15–41)
Albumin: 2.7 g/dL — ABNORMAL LOW (ref 3.5–5.0)
Alkaline Phosphatase: 58 U/L (ref 38–126)
Anion gap: 12 (ref 5–15)
BUN: 23 mg/dL — ABNORMAL HIGH (ref 6–20)
CO2: 24 mmol/L (ref 22–32)
Calcium: 8.9 mg/dL (ref 8.9–10.3)
Chloride: 98 mmol/L (ref 98–111)
Creatinine, Ser: 1.51 mg/dL — ABNORMAL HIGH (ref 0.61–1.24)
GFR, Estimated: 57 mL/min — ABNORMAL LOW (ref >60)
Glucose, Bld: 273 mg/dL — ABNORMAL HIGH (ref 70–99)
Potassium: 4 mmol/L (ref 3.5–5.1)
Sodium: 134 mmol/L — ABNORMAL LOW (ref 135–145)
Total Bilirubin: 1 mg/dL (ref 0.0–1.2)
Total Protein: 7.1 g/dL (ref 6.5–8.1)

## 2023-11-27 LAB — CBC WITH DIFFERENTIAL/PLATELET
Abs Immature Granulocytes: 0.06 10*3/uL (ref 0.00–0.07)
Basophils Absolute: 0 10*3/uL (ref 0.0–0.1)
Basophils Relative: 0 %
Eosinophils Absolute: 0.1 10*3/uL (ref 0.0–0.5)
Eosinophils Relative: 0 %
HCT: 30.5 % — ABNORMAL LOW (ref 39.0–52.0)
Hemoglobin: 10.2 g/dL — ABNORMAL LOW (ref 13.0–17.0)
Immature Granulocytes: 0 %
Lymphocytes Relative: 14 %
Lymphs Abs: 1.9 10*3/uL (ref 0.7–4.0)
MCH: 28.7 pg (ref 26.0–34.0)
MCHC: 33.4 g/dL (ref 30.0–36.0)
MCV: 85.9 fL (ref 80.0–100.0)
Monocytes Absolute: 1.9 10*3/uL — ABNORMAL HIGH (ref 0.1–1.0)
Monocytes Relative: 14 %
Neutro Abs: 9.5 10*3/uL — ABNORMAL HIGH (ref 1.7–7.7)
Neutrophils Relative %: 72 %
Platelets: 293 10*3/uL (ref 150–400)
RBC: 3.55 MIL/uL — ABNORMAL LOW (ref 4.22–5.81)
RDW: 11.9 % (ref 11.5–15.5)
WBC: 13.5 10*3/uL — ABNORMAL HIGH (ref 4.0–10.5)
nRBC: 0 % (ref 0.0–0.2)

## 2023-11-27 LAB — C-REACTIVE PROTEIN: CRP: 20.4 mg/dL — ABNORMAL HIGH (ref <1.0)

## 2023-11-27 LAB — SEDIMENTATION RATE: Sed Rate: 105 mm/h — ABNORMAL HIGH (ref 0–16)

## 2023-11-27 LAB — GLUCOSE, CAPILLARY
Glucose-Capillary: 250 mg/dL — ABNORMAL HIGH (ref 70–99)
Glucose-Capillary: 165 mg/dL — ABNORMAL HIGH (ref 70–99)

## 2023-11-27 MED ORDER — OXYCODONE HCL 5 MG PO TABS: 5.0000 mg | ORAL_TABLET | Freq: Four times a day (QID) | ORAL | 0 refills | Status: DC | PRN

## 2023-11-27 MED ORDER — FERROUS SULFATE 325 (65 FE) MG PO TBEC: 325.0000 mg | DELAYED_RELEASE_TABLET | Freq: Every day | ORAL | 3 refills | Status: AC

## 2023-11-27 NOTE — Progress Notes (Signed)
 Attempted Echocardiogram, Patient decided not to have the exam. I explained why the exam was ordered and Patient said he spoke with the Doctor, no further test is needed because he was cleared to go home.

## 2023-11-27 NOTE — Progress Notes (Signed)
 Discharge instructions (including medications) discussed with and copy provided to patient. Patient given opportunity to ask questions. Questions clarified.

## 2023-11-27 NOTE — Discharge Summary (Signed)
 Physician Discharge Summary   Patient: Michael Willis MRN: [id number] DOB: 1976-06-01  Admit date:     11/25/2023  Discharge date: 11/27/23  Discharge Physician: MDALA-GAUSI, Gwenette Greet   PCP: Wanda Plump, MD   Recommendations at discharge:   Follow-up with PCP  Discharge Diagnoses: Principal Problem:   Acute anemia Active Problems:   HLD (hyperlipidemia)   Essential hypertension   DM2 (diabetes mellitus, type 2) (HCC)   Fever   Low back pain   Leukocytosis   SIRS (systemic inflammatory response syndrome) (HCC)   Chronic diastolic CHF (congestive heart failure) (HCC)   CKD stage 3a, GFR 45-59 ml/min (HCC)  Resolved Problems:   * No resolved hospital problems. *  Hospital Course: 48 year old man with PMH of HFpEF, T2DM, CKD stage III, HTN who presented with complaints of lower back pain and was found to have acute anemia. Patient admitted for further workup.   The hospital course is in problem-based format below:    Assessment and Plan:  Fevers Patient presented with fevers, leukocytosis, elevated procalcitonin.  He had no localizing symptoms. RVP negative. Chest x-ray unremarkable.  No urinary symptoms. No abdominal symptoms although he did report a history of profuse diarrhea that lasted for 1 day in which he relates to having eaten a bad hot dog.  Blood cultures remained negative. Fevers resolved without further intervention.  Leukocytosis trended down from 24.7 on admission to 13.5 on the day of discharge.  No antibiotics were given.  It seems most likely that the patient's illness was related to his acute gastroenteritis prior to admission.  He had resolution of his fevers and leukocytosis without antibiotics. Patient to follow-up with PCP.   Acute anemia Patient denied melena or other bleeding. Acute drop in hemoglobin from 14.8 on 11/03/2023 to 7.4 on admission on 11/25/2023. Acuity of drop in hemoglobin suggestive of either bleeding or hemolysis. No  proteinuria, renal impairment or Xray changes to suggest multiple myeloma. Labs were not suggestive of hemolysis.  It seems most likely that the patient had an acute gastrointestinal bleed in the setting of profuse diarrhea during his episode of gastroenteritis. Hemoglobin trended up without intervention and was 10.2 on the day of discharge.   Subacute low back pain Patient has known radiculopathy due to spinal stenosis. Follows up as outpatient. MRI lumbar spine done on 4/7 showed disc protrusions, stenosis, nerve root compression. Prescribed a small quantity of oxycodone at discharge.   HFpEF TTE of 05/23/2023 revealed EF 50 to 55%, diastolic dysfunction. No exacerbation.   T2DM A1c is 7.4. Patient is on glipizide, Jardiance at home. Home medications were resumed at discharge.    CKD stage III Baseline creatinine appears to be 1.3-1.7. Patient remained at his baseline.   HLD Continued home Zetia.   HTN On Coreg, Lasix, spironolactone at home. Home medications were held due to relatively low pressures on presentation. Patient has been advised to resume them in 2 days.   Class I obesity For outpatient management.       Pain control - [employer] Controlled Substance Reporting System database was reviewed. and patient was instructed, not to drive, operate heavy machinery, perform activities at heights, swimming or participation in water activities or provide baby-sitting services while on Pain, Sleep and Anxiety Medications; until their outpatient Physician has advised to do so again. Also recommended to not to take more than prescribed Pain, Sleep and Anxiety Medications.  Consultants: n/a Procedures performed: n/a  Disposition: Home Diet recommendation:  Discharge Diet  Orders (From admission, onward)     Start     Ordered   11/27/23 0000  Diet Carb Modified        11/27/23 1129           Carb modified diet DISCHARGE MEDICATION: Allergies as of 11/27/2023   No  Known Allergies      Medication List     TAKE these medications    acetaminophen 500 MG tablet Commonly known as: TYLENOL Take 1,000 mg by mouth every 6 (six) hours as needed for mild pain (pain score 1-3).   atorvastatin 80 MG tablet Commonly known as: LIPITOR Take 1 tablet by mouth once daily   carvedilol 12.5 MG tablet Commonly known as: COREG Take 1 tablet (12.5 mg total) by mouth 2 (two) times daily with a meal. Do not resume until 11/29/2023 What changed: additional instructions   diclofenac Sodium 1 % Gel Commonly known as: VOLTAREN Apply 4 g topically 4 (four) times daily.   empagliflozin 10 MG Tabs tablet Commonly known as: Jardiance Take 1 tablet (10 mg total) by mouth daily before breakfast.   ezetimibe 10 MG tablet Commonly known as: Zetia Take 1 tablet (10 mg total) by mouth daily.   furosemide 20 MG tablet Commonly known as: Lasix Take 1 tablet (20 mg total) by mouth daily as needed (weight gain 3 lbs in 1 day or 5 lbs in 1 week).   glipiZIDE 10 MG 24 hr tablet Commonly known as: GLUCOTROL XL Take 1 tablet (10 mg total) by mouth daily with breakfast.   oxyCODONE 5 MG immediate release tablet Commonly known as: Roxicodone Take 1 tablet (5 mg total) by mouth every 6 (six) hours as needed.   spironolactone 25 MG tablet Commonly known as: ALDACTONE Take 1 tablet (25 mg total) by mouth daily.        Discharge Exam: Filed Weights   11/25/23 1335 11/26/23 0145 11/27/23 0323  Weight: 110 kg 105.8 kg 108.9 kg     General: Alert, oriented X3  Eyes: Pupils equal, reactive  Oral cavity: moist mucous membranes  Head: Atraumatic, normocephalic  Neck: supple  Chest: clear to auscultation. No crackles, no wheezes  CVS: S1,S2 RRR. No murmurs  Abd: No distention, soft, non-tender. No masses palpable  Extr: No edema   MSK: No joint deformities or swelling  Neurological: Grossly intact.    Condition at discharge: stable  The results of significant  diagnostics from this hospitalization (including imaging, microbiology, ancillary and laboratory) are listed below for reference.   Imaging Studies: DG Chest 1 View Result Date: 11/25/2023 CLINICAL DATA:  [id number] Fever [id number] EXAM: CHEST  1 VIEW COMPARISON:  Chest x-ray 04/03/2023 FINDINGS: The heart and mediastinal contours are within normal limits. No focal consolidation. No pulmonary edema. No pleural effusion. No pneumothorax. No acute osseous abnormality. IMPRESSION: No active disease. Electronically Signed   By: Tish Frederickson M.D.   On: 11/25/2023 22:56   MR LUMBAR SPINE WO CONTRAST Result Date: 11/25/2023 CLINICAL DATA:  Initial evaluation for acute myelopathy, left leg pain. EXAM: MRI LUMBAR SPINE WITHOUT CONTRAST TECHNIQUE: Multiplanar, multisequence MR imaging of the lumbar spine was performed. No intravenous contrast was administered. COMPARISON:  Prior radiograph from 11/04/2023. FINDINGS: Segmentation:  Examination degraded by motion artifact. Standard segmentation. Lowest well-formed disc space labeled the L5-S1 level. Alignment: Vertebral bodies normally aligned with preservation of the normal lumbar lordosis. No significant listhesis. Vertebrae: Vertebral body height maintained without acute or chronic fracture. Bone marrow signal intensity within  normal limits. No worrisome osseous lesions. No abnormal marrow edema. Conus medullaris and cauda equina: Conus extends to the T12-L1 level. Conus and cauda equina appear normal. Paraspinal and other soft tissues: Paraspinous soft tissues within normal limits. Prominent distension of the partially visualized urinary bladder noted. Disc levels: L1-2:  Unremarkable. L2-3: Normal interspace. Mild bilateral facet hypertrophy. No significant spinal stenosis. Foramina remain patent. L3-4: Normal interspace. Mild bilateral facet hypertrophy. No spinal stenosis. Foramina remain patent. L4-5: Disc desiccation. Superimposed left subarticular to foraminal disc  protrusion with annular fissure, potentially affecting either the left L4 or descending L5 nerve roots (series 8, image 30). Superimposed mild to moderate facet hypertrophy. Resultant moderate canal with severe left and moderate right lateral recess stenosis. Mild to moderate left with mild right L4 foraminal narrowing. L5-S1: Disc desiccation. Left subarticular disc protrusion with annular fissure impinges upon the descending left S1 nerve root in the left lateral recess (series 8, image 35). Superimposed mild facet hypertrophy. Resultant moderate to severe left lateral recess stenosis. Central canal remains patent. Mild left L5 foraminal stenosis. Right neural foramen remains patent. IMPRESSION: 1. Left subarticular disc protrusion with annular fissure at L5-S1, impinging upon the descending left S1 nerve root in the left lateral recess. 2. Left subarticular to foraminal disc protrusion at L4-5, potentially affecting either the left L4 or descending L5 nerve roots. Resultant moderate canal with severe left lateral recess stenosis at this level. 3. Prominent distension of the partially visualized urinary bladder. Clinical correlation for possible urinary retention recommended. Electronically Signed   By: Rise Mu M.D.   On: 11/25/2023 20:19   DG Lumbar Spine 2-3 Views Result Date: 11/04/2023 CLINICAL DATA:  Left sacroiliac pain radiating to foot EXAM: LUMBAR SPINE - 2-3 VIEW COMPARISON:  None Available. FINDINGS: Frontal and lateral views of the lumbar spine are obtained. Five non-rib-bearing lumbar type vertebral bodies will be assumed for numbering purposes. There is mild spondylosis at T12-L1 and L5-S1, with lower lumbar facet hypertrophy greatest at L5-S1. No acute fractures. Sacroiliac joints are unremarkable. Visualized portions of the pelvis are normal. On the lateral view there is a 1.2 cm linear metallic foreign body, likely a surgical clip. This is not seen on the frontal view, this may be  within the abdominal wall. Please correlate with prior surgical history. IMPRESSION: 1. Mild degenerative changes at the thoracolumbar and lumbosacral junctions as above. No acute fracture. Electronically Signed   By: Sharlet Salina M.D.   On: 11/04/2023 14:58   DG Sacrum/Coccyx Result Date: 11/04/2023 CLINICAL DATA:  Left buttock pain radiating down left leg EXAM: SACRUM AND COCCYX - 2+ VIEW COMPARISON:  None Available. FINDINGS: Pelvic inlet, pelvic outlet, and lateral views of the sacrum and coccyx are obtained. There are no acute or destructive bony abnormalities. The sacroiliac joints are unremarkable. Visualized portions of the bony pelvis and bilateral hips are unremarkable. There is mild spondylosis and facet hypertrophy at the L5-S1 level. IMPRESSION: 1. Lower lumbar spondylosis and facet hypertrophy. 2. No acute bony abnormality. Electronically Signed   By: Sharlet Salina M.D.   On: 11/04/2023 14:53    Microbiology: Results for orders placed or performed during the hospital encounter of 11/25/23  Resp panel by RT-PCR (RSV, Flu A&B, Covid) Anterior Nasal Swab     Status: None   Collection Time: 11/25/23  7:55 PM   Specimen: Anterior Nasal Swab  Result Value Ref Range Status   SARS Coronavirus 2 by RT PCR NEGATIVE NEGATIVE Final   Influenza A  by PCR NEGATIVE NEGATIVE Final   Influenza B by PCR NEGATIVE NEGATIVE Final    Comment: (NOTE) The Xpert Xpress SARS-CoV-2/FLU/RSV plus assay is intended as an aid in the diagnosis of influenza from Nasopharyngeal swab specimens and should not be used as a sole basis for treatment. Nasal washings and aspirates are unacceptable for Xpert Xpress SARS-CoV-2/FLU/RSV testing.  Fact Sheet for Patients: BloggerCourse.com  Fact Sheet for Healthcare Providers: SeriousBroker.it  This test is not yet approved or cleared by the [place] FDA and has been authorized for detection and/or diagnosis of  SARS-CoV-2 by FDA under an Emergency Use Authorization (EUA). This EUA will remain in effect (meaning this test can be used) for the duration of the COVID-19 declaration under Section 564(b)(1) of the Act, 21 U.S.C. section 360bbb-3(b)(1), unless the authorization is terminated or revoked.     Resp Syncytial Virus by PCR NEGATIVE NEGATIVE Final    Comment: (NOTE) Fact Sheet for Patients: BloggerCourse.com  Fact Sheet for Healthcare Providers: SeriousBroker.it  This test is not yet approved or cleared by the [place] FDA and has been authorized for detection and/or diagnosis of SARS-CoV-2 by FDA under an Emergency Use Authorization (EUA). This EUA will remain in effect (meaning this test can be used) for the duration of the COVID-19 declaration under Section 564(b)(1) of the Act, 21 U.S.C. section 360bbb-3(b)(1), unless the authorization is terminated or revoked.  Performed at [facility], [address], [place], [place] [address]   Blood culture (routine x 2)     Status: None (Preliminary result)   Collection Time: 11/25/23  8:40 PM   Specimen: BLOOD  Result Value Ref Range Status   Specimen Description BLOOD SITE NOT SPECIFIED  Final   Special Requests   Final    BOTTLES DRAWN AEROBIC AND ANAEROBIC Blood Culture results may not be optimal due to an inadequate volume of blood received in culture bottles   Culture   Final    NO GROWTH 2 DAYS Performed at [facility], [address] [place], [place] [address]    Report Status PENDING  Incomplete  Blood culture (routine x 2)     Status: None (Preliminary result)   Collection Time: 11/26/23  4:46 AM   Specimen: BLOOD RIGHT HAND  Result Value Ref Range Status   Specimen Description BLOOD RIGHT HAND  Final   Special Requests   Final    BOTTLES DRAWN AEROBIC AND ANAEROBIC Blood Culture results may not be optimal due to an inadequate volume of blood received in  culture bottles   Culture   Final    NO GROWTH 1 DAY Performed at [facility], [address], [place], [place] [address]    Report Status PENDING  Incomplete    Labs: CBC: Recent Labs  Lab 11/25/23 1710 11/26/23 0446 11/26/23 1323 11/27/23 0524  WBC 24.7* 21.3*  --  13.5*  NEUTROABS 18.7* 15.6*  --  9.5*  HGB 7.4* 8.0* 10.2* 10.2*  HCT 21.8* 23.7* 30.4* 30.5*  MCV 86.2 86.8  --  85.9  PLT 329 326  --  293   Basic Metabolic Panel: Recent Labs  Lab 11/25/23 1710 11/26/23 0446 11/27/23 0524  NA 136 134* 134*  K 3.8 3.8 4.0  CL 101 100 98  CO2 23 23 24   GLUCOSE 219* 212* 273*  BUN 29* 33* 23*  CREATININE 1.68* 1.91* 1.51*  CALCIUM 8.8* 8.9 8.9  MG 2.3 2.4  --  Liver Function Tests: Recent Labs  Lab 11/25/23 1710 11/26/23 0446 11/27/23 0524  AST 27 28 41  ALT 28 32 41  ALKPHOS 58 57 58  BILITOT 1.9* 1.7* 1.0  PROT 7.2 7.2 7.1  ALBUMIN 3.0* 2.9* 2.7*   CBG: Recent Labs  Lab 11/26/23 0724 11/26/23 1224 11/26/23 1445 11/26/23 1709 11/27/23 0726  GLUCAP 216* 251* 233* 195* 250*    Discharge time spent: greater than 30 minutes.  Signed: MDALA-GAUSI, Gwenette Greet, MD [facility] 11/27/2023

## 2023-11-27 NOTE — Plan of Care (Signed)

## 2023-11-28 LAB — GLUCOSE, CAPILLARY: Glucose-Capillary: 309 mg/dL — ABNORMAL HIGH (ref 70–99)

## 2023-11-28 LAB — HAPTOGLOBIN: Haptoglobin: 424 mg/dL — ABNORMAL HIGH (ref 23–355)

## 2023-11-29 ENCOUNTER — Ambulatory Visit: Payer: Medicaid Other | Admitting: Internal Medicine

## 2023-11-30 LAB — CULTURE, BLOOD (ROUTINE X 2): Culture: NO GROWTH

## 2023-12-01 LAB — CULTURE, BLOOD (ROUTINE X 2): Culture: NO GROWTH

## 2023-12-13 ENCOUNTER — Other Ambulatory Visit: Payer: Self-pay

## 2023-12-13 ENCOUNTER — Other Ambulatory Visit (HOSPITAL_COMMUNITY): Payer: Self-pay | Admitting: Internal Medicine

## 2023-12-24 ENCOUNTER — Emergency Department (HOSPITAL_COMMUNITY)
Admission: EM | Admit: 2023-12-24 | Discharge: 2023-12-24 | Disposition: A | Discharge status: Home / Self Care | Attending: Emergency Medicine | Admitting: Emergency Medicine

## 2023-12-24 ENCOUNTER — Encounter (HOSPITAL_COMMUNITY): Payer: Self-pay | Admitting: Emergency Medicine

## 2023-12-24 ENCOUNTER — Emergency Department (HOSPITAL_COMMUNITY)

## 2023-12-24 ENCOUNTER — Other Ambulatory Visit: Payer: Self-pay

## 2023-12-24 DIAGNOSIS — Z79899 Other long term (current) drug therapy: Secondary | ICD-10-CM | POA: Insufficient documentation

## 2023-12-24 DIAGNOSIS — N451 Epididymitis: Secondary | ICD-10-CM | POA: Insufficient documentation

## 2023-12-24 DIAGNOSIS — E119 Type 2 diabetes mellitus without complications: Secondary | ICD-10-CM | POA: Diagnosis not present

## 2023-12-24 DIAGNOSIS — N50812 Left testicular pain: Secondary | ICD-10-CM | POA: Diagnosis present

## 2023-12-24 DIAGNOSIS — I10 Essential (primary) hypertension: Secondary | ICD-10-CM | POA: Diagnosis not present

## 2023-12-24 DIAGNOSIS — Z7984 Long term (current) use of oral hypoglycemic drugs: Secondary | ICD-10-CM | POA: Insufficient documentation

## 2023-12-24 LAB — URINALYSIS, W/ REFLEX TO CULTURE (INFECTION SUSPECTED)
Bilirubin Urine: NEGATIVE
Glucose, UA: >=500 mg/dL — AB
Hgb urine dipstick: NEGATIVE
Ketones, ur: 5 mg/dL — AB
Nitrite: NEGATIVE
Protein, ur: NEGATIVE mg/dL
Specific Gravity, Urine: 1.016 (ref 1.005–1.030)
pH: 5 (ref 5.0–8.0)

## 2023-12-24 LAB — HIV ANTIBODY (ROUTINE TESTING W REFLEX): HIV Screen 4th Generation wRfx: NONREACTIVE

## 2023-12-24 MED ORDER — CIPROFLOXACIN HCL 500 MG PO TABS: 500.0000 mg | ORAL_TABLET | Freq: Once | ORAL | Status: DC

## 2023-12-24 MED ORDER — CEFUROXIME AXETIL 500 MG PO TABS: 500.0000 mg | ORAL_TABLET | Freq: Two times a day (BID) | ORAL | 0 refills | Status: AC

## 2023-12-24 MED ORDER — OXYCODONE-ACETAMINOPHEN 5-325 MG PO TABS
2.0000 | ORAL_TABLET | Freq: Once | ORAL | Status: AC
  Administered 2023-12-24: 2 via ORAL
  Filled 2023-12-24: qty 2

## 2023-12-24 MED ORDER — IBUPROFEN 400 MG PO TABS
600.0000 mg | ORAL_TABLET | Freq: Once | ORAL | Status: AC
  Administered 2023-12-24: 600 mg via ORAL
  Filled 2023-12-24: qty 1

## 2023-12-24 MED ORDER — NAPROXEN 375 MG PO TABS: 375.0000 mg | ORAL_TABLET | Freq: Two times a day (BID) | ORAL | 0 refills | Status: DC

## 2023-12-24 NOTE — Discharge Instructions (Addendum)
 Take the medications as prescribed.  Take antibiotics as prescribed.  Follow-up with urologist for further evaluation of the symptoms persist

## 2023-12-24 NOTE — ED Triage Notes (Signed)
 Patient coming to ED for evaluation of testicular pain.  Reports pain started suddenly last night.  C/o L testicular pain and swelling.  No reports of injury. States he is also having trouble urinating and states "I just comes out when it wants to."

## 2023-12-24 NOTE — ED Provider Triage Note (Signed)
 Emergency Medicine Provider Triage Evaluation Note  Michael Willis , a 48 y.o. male  was evaluated in triage.  Pt complains of left testicle pain and swelling.  Review of Systems  Positive: Left testicle pain and swelling Negative: discharge  Physical Exam  BP (!) 166/105   Pulse (!) 110   Temp 97.6 F (36.4 C) (Oral)   Resp 17   Ht 5\' 10"  (1.778 m)   Wt 108.9 kg   SpO2 100%   BMI 34.44 kg/m  Gen:   Awake, no distress   Resp:  Normal effort  MSK:   Moves extremities without difficulty  Other:  Very hard left testicle, TTP  Medical Decision Making  Medically screening exam initiated at 6:16 AM.  Appropriate orders placed.  [name] was informed that the remainder of the evaluation will be completed by another provider, this initial triage assessment does not replace that evaluation, and the importance of remaining in the ED until their evaluation is complete.     Sherel Dikes, PA-C 12/24/23 [phone]

## 2023-12-24 NOTE — ED Provider Notes (Signed)
 [place] EMERGENCY DEPARTMENT AT [facility] Provider Note   CSN: [id number] Arrival date & time: 12/24/23  0601     History  Chief Complaint  Patient presents with   Testicle Pain    Michael Willis is a 48 y.o. male.   Testicle Pain     Patient has a history of diabetes hypertension hyperlipidemia.  He presents to the ED for evaluation of testicle pain.  Patient states symptoms started last night.  He started having pain with urination as well as swelling and tenderness to his left testicle.  He denies any recent injury.  No discharge.  No fevers or chills.  No vomiting.  Home Medications Prior to Admission medications   Medication Sig Start Date End Date Taking? Authorizing Provider  atorvastatin  (LIPITOR ) 80 MG tablet Take 1 tablet by mouth once daily 05/30/23  Yes Bensimhon, Rheta Celestine, MD  carvedilol  (COREG ) 12.5 MG tablet Take 1 tablet (12.5 mg total) by mouth 2 (two) times daily with a meal. Do not resume until 11/29/2023 11/27/23  Yes Mdala-Gausi, Masiku Agatha, MD  cefUROXime (CEFTIN) 500 MG tablet Take 1 tablet (500 mg total) by mouth 2 (two) times daily with a meal for 10 days. 12/24/23 01/03/24 Yes Trish Furl, MD  diclofenac  Sodium (VOLTAREN ) 1 % GEL Apply 4 g topically 4 (four) times daily. Patient taking differently: Apply 1 Application topically daily as needed (pain). 11/01/23  Yes Albertus Hughs, DO  ezetimibe  (ZETIA ) 10 MG tablet Take 1 tablet (10 mg total) by mouth daily. 05/23/23  Yes Bensimhon, Rheta Celestine, MD  furosemide  (LASIX ) 20 MG tablet Take 1 tablet (20 mg total) by mouth daily as needed (weight gain 3 lbs in 1 day or 5 lbs in 1 week). 05/23/23  Yes Bensimhon, Rheta Celestine, MD  glipiZIDE  (GLUCOTROL  XL) 10 MG 24 hr tablet Take 1 tablet (10 mg total) by mouth daily with breakfast. 11/13/23  Yes Paz, Jose E, MD  JARDIANCE  10 MG TABS tablet Take one tablet by mouth daily. 12/16/23  Yes Bensimhon, Rheta Celestine, MD  naproxen (NAPROSYN) 375 MG tablet Take 1 tablet (375 mg  total) by mouth 2 (two) times daily. 12/24/23  Yes Trish Furl, MD  sacubitril -valsartan  (ENTRESTO ) 97-103 MG Take 1 tablet by mouth 2 (two) times daily.   Yes [provider]  spironolactone  (ALDACTONE ) 25 MG tablet Take 1 tablet (25 mg total) by mouth daily. 05/23/23  Yes Bensimhon, Rheta Celestine, MD  ferrous sulfate  325 (65 FE) MG EC tablet Take 1 tablet (325 mg total) by mouth daily with breakfast. Patient not taking: Reported on 12/24/2023 11/27/23 11/26/24  Mdala-Gausi, Masiku Agatha, MD  oxyCODONE  (ROXICODONE ) 5 MG immediate release tablet Take 1 tablet (5 mg total) by mouth every 6 (six) hours as needed. Patient not taking: Reported on 12/24/2023 11/27/23   Mdala-Gausi, Masiku Agatha, MD      Allergies    Patient has no known allergies.    Review of Systems   Review of Systems  Genitourinary:  Positive for testicular pain.    Physical Exam Updated Vital Signs BP 131/82   Pulse 92   Temp 98 F (36.7 C) (Oral)   Resp 20   Ht 1.778 m (5\' 10" )   Wt 108.9 kg   SpO2 100%   BMI 34.44 kg/m  Physical Exam Vitals and nursing note reviewed.  Constitutional:      General: He is not in acute distress.    Appearance: He is well-developed.  HENT:  Head: Normocephalic and atraumatic.     Right Ear: External ear normal.     Left Ear: External ear normal.  Eyes:     General: No scleral icterus.       Right eye: No discharge.        Left eye: No discharge.     Conjunctiva/sclera: Conjunctivae normal.  Neck:     Trachea: No tracheal deviation.  Cardiovascular:     Rate and Rhythm: Normal rate.  Pulmonary:     Effort: Pulmonary effort is normal. No respiratory distress.     Breath sounds: No stridor.  Abdominal:     General: There is no distension.     Hernia: There is no hernia in the left inguinal area or right inguinal area.  Genitourinary:    Testes:        Left: Tenderness and swelling present.     Epididymis:     Left: Tenderness present.  Musculoskeletal:        General:  No swelling or deformity.     Cervical back: Neck supple.  Skin:    General: Skin is warm and dry.     Findings: No rash.  Neurological:     Mental Status: He is alert. Mental status is at baseline.     Cranial Nerves: No dysarthria or facial asymmetry.     Motor: No seizure activity.     ED Results / Procedures / Treatments   Labs (all labs ordered are listed, but only abnormal results are displayed) Labs Reviewed  URINALYSIS, W/ REFLEX TO CULTURE (INFECTION SUSPECTED) - Abnormal; Notable for the following components:      Result Value   APPearance CLOUDY (*)    Glucose, UA >=500 (*)    Ketones, ur 5 (*)    Leukocytes,Ua SMALL (*)    Bacteria, UA FEW (*)    All other components within normal limits  URINE CULTURE  HIV ANTIBODY (ROUTINE TESTING W REFLEX)  GC/CHLAMYDIA PROBE AMP ([place]) NOT AT [facility]    EKG None  Radiology US  SCROTUM W/DOPPLER Result Date: 12/24/2023 CLINICAL DATA:  Left testicular pain and swelling. EXAM: SCROTAL ULTRASOUND DOPPLER ULTRASOUND OF THE TESTICLES TECHNIQUE: Complete ultrasound examination of the testicles, epididymis, and other scrotal structures was performed. Color and spectral Doppler ultrasound were also utilized to evaluate blood flow to the testicles. COMPARISON:  None Available. FINDINGS: Right testicle Measurements: 3.6 x 2.9 x 2.0 cm. No mass or microlithiasis visualized. Left testicle Measurements: 4.5 x 2.8 x 3.0 cm. No mass or microlithiasis visualized. Testicle is hypervascular on Doppler. Right epididymis: Multiple cysts are noted, the largest measuring 5 mm. Left epididymis: 4 mm cyst is noted. Hypervascular on Doppler suggesting epididymitis. Hydrocele:  Small right hydrocele. Varicocele:  None visualized. Pulsed Doppler interrogation of both testes demonstrates normal low resistance arterial and venous waveforms bilaterally. IMPRESSION: No evidence of testicular mass or torsion. Left testicle and epididymis are hypervascular  suggesting epididymo-orchitis. Small right hydrocele. Electronically Signed   By: Rosalene Colon M.D.   On: 12/24/2023 08:59    Procedures Procedures    Medications Ordered in ED Medications  oxyCODONE -acetaminophen  (PERCOCET/ROXICET) 5-325 MG per tablet 2 tablet (2 tablets Oral Given 12/24/23 0621)  ibuprofen  (ADVIL ) tablet 600 mg (600 mg Oral Given 12/24/23 0723)    ED Course/ Medical Decision Making/ A&P Clinical Course as of 12/24/23 1024  Tue Dec 24, 2023  0959 Urinalysis suggestive of infection [JK]  0959 Ultrasound shows no evidence of testicular torsion  or mass.  There is evidence of epididymal orchitis [JK]    Clinical Course User Index [JK] Trish Furl, MD                                 Medical Decision Making Problems Addressed: Epididymitis: acute illness or injury that poses a threat to life or bodily functions  Amount and/or Complexity of Data Reviewed Labs: ordered. Decision-making details documented in ED Course. Radiology: ordered.  Risk Prescription drug management.   Patient presented to the ED for evaluation of testicle pain.  Patient started noticed swelling and discomfort.  He denies any new sexual partners.  Exam is consistent with epididymitis.  Ultrasound shows findings consistent with epididymitis/orchitis.  No evidence of mass or torsion.  Patient is otherwise afebrile and nontoxic-appearing.  Will discharge home on course of antibiotics.        Final Clinical Impression(s) / ED Diagnoses Final diagnoses:  Epididymitis    Rx / DC Orders ED Discharge Orders          Ordered    naproxen (NAPROSYN) 375 MG tablet  2 times daily        12/24/23 1007    cefUROXime (CEFTIN) 500 MG tablet  2 times daily with meals        12/24/23 1007              Trish Furl, MD 12/24/23 1024

## 2023-12-25 LAB — GC/CHLAMYDIA PROBE AMP (~~LOC~~) NOT AT ARMC
Chlamydia: NEGATIVE
Comment: NEGATIVE
Comment: NORMAL
Neisseria Gonorrhea: NEGATIVE

## 2023-12-26 LAB — URINE CULTURE: Culture: >=100000 — AB

## 2023-12-27 ENCOUNTER — Telehealth (HOSPITAL_BASED_OUTPATIENT_CLINIC_OR_DEPARTMENT_OTHER): Payer: Self-pay

## 2023-12-27 NOTE — Telephone Encounter (Signed)
 Post ED Visit - Positive Culture Follow-up  Culture report reviewed by antimicrobial stewardship pharmacist: [facility] [x]  [place], [place].D. []  Skeet Duke, [address].D., BCPS AQ-ID []  Leslee Rase, Pharm.D., BCPS []  Garland Junk, Pharm.D., BCPS []  [place], [address].D., BCPS, AAHIVP []  Alcide Aly, Pharm.D., BCPS, AAHIVP []  Jerri Morale, PharmD, BCPS []  Graham Laws, PharmD, BCPS []  Cleda Curly, PharmD, BCPS []  Tamar Fairly, PharmD []  Ballard Levels, PharmD, BCPS []  Ollen Beverage, PharmD  [facility] []  Arlyne Bering, PharmD []  Sherryle Don, PharmD []  Van Gelinas, PharmD []  Delila Felty, Rph []  [place]) Cleora Daft, PharmD []  Augustina Block, PharmD []  Arie Kurtz, PharmD []  Sharlyn Deaner, PharmD []  Agnes Hose, PharmD []  Kendall Pauls, PharmD []  Gladstone Lamer, PharmD []  Armanda Bern, PharmD []  Tera Fellows, PharmD   Positive urine culture Treated with Cefuroxime , organism sensitive to the same and no further patient follow-up is required at this time.  Delena Feil 12/27/2023, 1:56 PM

## 2024-01-08 ENCOUNTER — Encounter: Payer: Self-pay | Admitting: Internal Medicine

## 2024-01-08 ENCOUNTER — Ambulatory Visit: Admitting: Internal Medicine

## 2024-01-08 VITALS — BP 136/80 | HR 89 | Temp 98.1°F | Resp 16 | Ht 70.0 in | Wt 246.1 lb

## 2024-01-08 DIAGNOSIS — Z7984 Long term (current) use of oral hypoglycemic drugs: Secondary | ICD-10-CM

## 2024-01-08 DIAGNOSIS — E1165 Type 2 diabetes mellitus with hyperglycemia: Secondary | ICD-10-CM

## 2024-01-08 DIAGNOSIS — N451 Epididymitis: Secondary | ICD-10-CM | POA: Diagnosis not present

## 2024-01-08 DIAGNOSIS — I10 Essential (primary) hypertension: Secondary | ICD-10-CM

## 2024-01-08 LAB — CBC WITH DIFFERENTIAL/PLATELET
Basophils Absolute: 0.1 10*3/uL (ref 0.0–0.1)
Basophils Relative: 0.4 % (ref 0.0–3.0)
Eosinophils Absolute: 0.2 10*3/uL (ref 0.0–0.7)
Eosinophils Relative: 1.1 % (ref 0.0–5.0)
HCT: 36.2 % — ABNORMAL LOW (ref 39.0–52.0)
Hemoglobin: 11.7 g/dL — ABNORMAL LOW (ref 13.0–17.0)
Lymphocytes Relative: 21.7 % (ref 12.0–46.0)
Lymphs Abs: 3.2 10*3/uL (ref 0.7–4.0)
MCHC: 32.3 g/dL (ref 30.0–36.0)
MCV: 85.8 fl (ref 78.0–100.0)
Monocytes Absolute: 1 10*3/uL (ref 0.1–1.0)
Monocytes Relative: 6.9 % (ref 3.0–12.0)
Neutro Abs: 10.2 10*3/uL — ABNORMAL HIGH (ref 1.4–7.7)
Neutrophils Relative %: 69.9 % (ref 43.0–77.0)
Platelets: 400 10*3/uL (ref 150.0–400.0)
RBC: 4.22 Mil/uL (ref 4.22–5.81)
RDW: 15.1 % (ref 11.5–15.5)
WBC: 14.5 10*3/uL — ABNORMAL HIGH (ref 4.0–10.5)

## 2024-01-08 LAB — BASIC METABOLIC PANEL WITH GFR
BUN: 22 mg/dL (ref 6–23)
CO2: 28 meq/L (ref 19–32)
Calcium: 9.4 mg/dL (ref 8.4–10.5)
Chloride: 104 meq/L (ref 96–112)
Creatinine, Ser: 0.98 mg/dL (ref 0.40–1.50)
GFR: 92.05 mL/min (ref >60.00)
Glucose, Bld: 138 mg/dL — ABNORMAL HIGH (ref 70–99)
Potassium: 4.3 meq/L (ref 3.5–5.1)
Sodium: 138 meq/L (ref 135–145)

## 2024-01-08 MED ORDER — CIPROFLOXACIN HCL 500 MG PO TABS: 500.0000 mg | ORAL_TABLET | Freq: Two times a day (BID) | ORAL | 0 refills | Status: AC

## 2024-01-08 MED ORDER — RYBELSUS 3 MG PO TABS: 3.0000 mg | ORAL_TABLET | Freq: Every day | ORAL | 0 refills | Status: DC

## 2024-01-08 MED ORDER — RYBELSUS 7 MG PO TABS: 7.0000 mg | ORAL_TABLET | Freq: Every day | ORAL | 3 refills | Status: DC

## 2024-01-08 NOTE — Progress Notes (Signed)
 Subjective:    Patient ID: Michael Willis, male    DOB: 02-07-1976, 48 y.o.   MRN: [id number]  DOS:  01/08/2024 Type of visit - description: Follow-up  Since the last visit was admitted to the hospital and went to the ER.  Chart reviewed.  Admitted to the hospital, 4/7, discharged 11/27/2023. Fever, diarrhea: White count was quite elevated otherwise workup negative,  fever self resolved.  No antibiotics provided. Also had anemia, felt to be for acute bleeding in the setting of profuse diarrhea during admission. A1c at that time was 7.4.  ER 12/24/2023: Presented with testicular pain and swelling. Scrotal US : Consistent with left orchitis, epididymitis. Gonorrhea chlamydia negative, HIV negative. Urine culture: + E. coli. Prescribed cefuroxime   At this point, denies fever or chills. No diarrhea. Testicle pain has decreased but the swelling, although better, is not gone. Denies any dysuria or gross hematuria. Denies any genital rash. Reports that prior to the diagnosis of UTI the urine had a very unusual odor and was deep yellow in color, that has improved.  Review of Systems See above   Past Medical History:  Diagnosis Date   Diabetes (HCC) 01/05/2013   Elevated LFTs 01/05/2013   HTN (hypertension) 12/12/2012   Hyperlipidemia 12/04/2013   Obesity    Tinea pedis, onychomycosis 01/05/2013    Past Surgical History:  Procedure Laterality Date   RIGHT/LEFT HEART CATH AND CORONARY ANGIOGRAPHY N/A 01/30/2023   Procedure: RIGHT/LEFT HEART CATH AND CORONARY ANGIOGRAPHY;  Surgeon: Mardell Shade, MD;  Location: [facility];  Service: Cardiovascular;  Laterality: N/A;    Current Outpatient Medications  Medication Instructions   atorvastatin  (LIPITOR ) 80 mg, Oral, Daily   carvedilol  (COREG ) 12.5 mg, 2 times daily with meals   ciprofloxacin  (CIPRO ) 500 mg, Oral, 2 times daily   ezetimibe  (ZETIA ) 10 mg, Oral, Daily   ferrous sulfate  325 mg, Oral, Daily with breakfast    furosemide  (LASIX ) 20 mg, Oral, Daily PRN   glipiZIDE  (GLUCOTROL  XL) 10 mg, Oral, Daily with breakfast   Rybelsus 3 mg, Oral, Daily   Rybelsus 7 mg, Oral, Daily   spironolactone  (ALDACTONE ) 25 mg, Oral, Daily       Objective:   Physical Exam BP 136/80   Pulse 89   Temp 98.1 F (36.7 C) (Oral)   Resp 16   Ht 5\' 10"  (1.778 m)   Wt 246 lb 2 oz (111.6 kg)   SpO2 97%   BMI 35.32 kg/m  General:   Well developed, NAD, BMI noted. HEENT:  Normocephalic . Face symmetric, atraumatic Lungs:  CTA B Normal respiratory effort, no intercostal retractions, no accessory muscle use. Heart: RRR,  no murmur.  Lower extremities: no pretibial edema bilaterally GU: No rash Penis without rash, no discharge R testicle is normal L scrotum: Enlarged, fluctuant, nontender, size: About 2 inches. Skin: Not pale. Not jaundice Neurologic:  alert & oriented X3.  Speech normal, gait appropriate for age and unassisted Psych--  Cognition and judgment appear intact.  Cooperative with normal attention span and concentration.  Behavior appropriate. No anxious or depressed appearing.      Assessment   Problem list:  (reestablish 03/2023) DM 2014 HTN Hyperlipidemia Elevated LFTs Chronic systolic heart failure Morbid obesity Onychomycosis Snoring: suspect OSA, saw pulmonary 2014, did not purse sleep study   PLAN: Fever, diarrhea: Admitted to the hospital, symptoms resolved.  Creatinine elevated, white count elevated.  Rechecking. UTI, left epididymitis -orchitis:  Dx and treated w/ cephalosporin per the  ER team. Pain decreased, swelling is still there (US   showed no testicular mass). Plan: Switch to Cipro  and reassess next month.  If swelling continues refer to urology. Of note, Cipro  may increase effect of glimepiride , patient reminded about hypoglycemia side effects. DM: Since the last visit, he is taking Glucotrol  XL and Jardiance , A1c decreased from 10 to 7.4. Due to UTI and  epididymoorchitis will stop Jardiance , we could consider restarting in the future Rec GLP-1, does not like the idea of injectables thus Rx Rybelsus 3 mg daily then 7  mg daily. Compliance: Remains an issue, he is not clear on what medications he takes for HTN or  chronic systolic heart failure. RTC 1 month.

## 2024-01-08 NOTE — Patient Instructions (Addendum)
 Take ciprofloxacin  500 mg 1 tablet twice a day.  This is an antibiotic for the testicle swelling.  Stop Jardiance   Take Rybelsus 3 mg 1 tablet a day for 1 month  Take Rybelsus 7 mg 1 tablet daily after you finish  Diabetes: Watch for low blood sugars, check them regularly.  - early in AM fasting  ( blood sugar goal 70-130) - 2 hours after a meal (blood sugar goal less than 180)      GO TO THE LAB :  Get the blood work   Your results will be posted on MyChart with my comments  Next office visit for a checkup in 1 month.  Is very important you come back.  Please make an appointment before you leave today

## 2024-01-09 ENCOUNTER — Other Ambulatory Visit (HOSPITAL_COMMUNITY): Payer: Self-pay

## 2024-01-09 ENCOUNTER — Telehealth: Payer: Self-pay

## 2024-01-09 NOTE — Assessment & Plan Note (Signed)
 Fever, diarrhea: Admitted to the hospital, symptoms resolved.  Creatinine elevated, white count elevated.  Rechecking. UTI, left epididymitis -orchitis:  Dx and treated w/ cephalosporin per the ER team. Pain decreased, swelling is still there (US   showed no testicular mass). Plan: Switch to Cipro  and reassess next month.  If swelling continues refer to urology. Of note, Cipro  may increase effect of glimepiride , patient reminded about hypoglycemia side effects. DM: Since the last visit, he is taking Glucotrol  XL and Jardiance , A1c decreased from 10 to 7.4. Due to UTI and epididymoorchitis will stop Jardiance , we could consider restarting in the future Rec GLP-1, does not like the idea of injectables thus Rx Rybelsus 3 mg daily then 7  mg daily. Compliance: Remains an issue, he is not clear on what medications he takes for HTN or  chronic systolic heart failure. RTC 1 month.

## 2024-01-09 NOTE — Telephone Encounter (Signed)
 Pharmacy Patient Advocate Encounter   Received notification from CoverMyMeds that prior authorization for Rybelsus 3MG  tablets is required/requested.   Insurance verification completed.   The patient is insured through [facility] .   Per test claim:  Rybelsus tabs are not preffered by insurance. Patient must try and fail 2 of the preffered options below in order to consider Rybelsus.

## 2024-01-10 ENCOUNTER — Ambulatory Visit: Payer: Self-pay | Admitting: Internal Medicine

## 2024-01-10 ENCOUNTER — Telehealth: Payer: Self-pay

## 2024-01-10 ENCOUNTER — Other Ambulatory Visit (HOSPITAL_COMMUNITY): Payer: Self-pay

## 2024-01-10 MED ORDER — OZEMPIC (0.25 OR 0.5 MG/DOSE) 2 MG/3ML ~~LOC~~ SOPN: 0.2500 mg | PEN_INJECTOR | SUBCUTANEOUS | 3 refills | Status: AC

## 2024-01-10 NOTE — Telephone Encounter (Signed)
 Patient preference was oral medication, reason why I prescribed Rybelsus .  Please call patient, insurance request to try first Ozempic  0.25 mg weekly injection.  If he is willing, send the prescription.

## 2024-01-10 NOTE — Telephone Encounter (Signed)
 Pharmacy Patient Advocate Encounter   Received notification from Onbase that prior authorization for Ozempic (0.25 or 0.5 MG/DOSE) 2MG /3ML pen-injectors is required/requested.   Insurance verification completed.   The patient is insured through [facility] .   Per test claim: PA required; PA submitted to above mentioned insurance via CoverMyMeds Key/confirmation #/EOC [id number] Status is pending

## 2024-01-10 NOTE — Telephone Encounter (Signed)
 Spoke w/ Pt- informed of insurance decision and PCP recommendations, Pt agreed to try Ozempic since it is once weekly. Rx sent.

## 2024-01-14 NOTE — Telephone Encounter (Signed)
 Pharmacy Patient Advocate Encounter  Received notification from [facility] that Prior Authorization for Ozempic (0.25 or 0.5 MG/DOSE) 2MG /3ML pen-injectors has been DENIED.  No reason given; No denial letter received via Fax or CMM. It has been requested and will be uploaded to the media tab once received.   PA #/Case ID/Reference #: [id number]

## 2024-01-14 NOTE — Telephone Encounter (Signed)
 Noted, will be on the look out for denial letter

## 2024-01-15 NOTE — Telephone Encounter (Signed)
 Where is the denial letter attached at?

## 2024-01-15 NOTE — Telephone Encounter (Signed)
DENIAL LETTER ATTACHED.

## 2024-01-16 ENCOUNTER — Telehealth: Payer: Self-pay | Admitting: Pharmacist

## 2024-01-16 ENCOUNTER — Other Ambulatory Visit (HOSPITAL_COMMUNITY): Payer: Self-pay | Admitting: Internal Medicine

## 2024-01-16 NOTE — Telephone Encounter (Signed)
 Can we appeal please? Pt has previously tried AND failed metformin  (see med history) and has CHF.

## 2024-01-16 NOTE — Telephone Encounter (Signed)
Media tab

## 2024-01-16 NOTE — Telephone Encounter (Signed)
 Appeal has been submitted for Ozempic . Will advise when response is received, please be advised that most companies may take 30 days to make a decision. Appeal letter and supporting documentation have been faxed to [phone] on 01/16/2024 @3 :33 pm.  Thank you, Dene Fines, PharmD Clinical Pharmacist  [place]  Direct Dial: [phone]

## 2024-01-21 ENCOUNTER — Other Ambulatory Visit (HOSPITAL_COMMUNITY): Payer: Self-pay

## 2024-01-22 ENCOUNTER — Other Ambulatory Visit (HOSPITAL_COMMUNITY): Payer: Self-pay | Admitting: Internal Medicine

## 2024-01-27 ENCOUNTER — Other Ambulatory Visit (HOSPITAL_COMMUNITY): Payer: Self-pay

## 2024-01-27 NOTE — Telephone Encounter (Signed)
 Insurance approved the appeal for Ozempic :

## 2024-01-28 ENCOUNTER — Encounter (HOSPITAL_COMMUNITY): Payer: Self-pay

## 2024-01-28 ENCOUNTER — Other Ambulatory Visit (HOSPITAL_COMMUNITY): Payer: Self-pay

## 2024-01-30 ENCOUNTER — Telehealth (HOSPITAL_COMMUNITY): Payer: Self-pay | Admitting: Pharmacy Technician

## 2024-01-30 NOTE — Telephone Encounter (Addendum)
 Advanced Heart Failure Patient Advocate Encounter  Received request from [employer] for new Entresto  script. It looks like the patients medication list has been updated and Entresto  has been taken off, due to noncompliance. Would not be able to add medication back until patient is seen in office. Sent patient mychart message with this information.  Correne Dillon, CPhT

## 2024-02-10 ENCOUNTER — Encounter: Payer: Self-pay | Admitting: Internal Medicine

## 2024-02-10 ENCOUNTER — Ambulatory Visit: Payer: Self-pay | Admitting: Internal Medicine

## 2024-05-14 ENCOUNTER — Telehealth (HOSPITAL_COMMUNITY): Payer: Self-pay | Admitting: Internal Medicine

## 2024-06-19 ENCOUNTER — Telehealth: Payer: Self-pay

## 2024-06-19 NOTE — Telephone Encounter (Signed)
 Called Pt to see if he ever saw Endo regarding diabetes- states he never did and didn't want to see anyone right now, tried explaining tat Dr. Amon would like for him to see them for his diabetes, Pt then hung up the phone.

## 2024-06-19 NOTE — Telephone Encounter (Signed)
 noted
